# Patient Record
Sex: Male | Born: 1955 | Race: Black or African American | Hispanic: No | Marital: Married | Smoking: Never smoker
Health system: Southern US, Community
[De-identification: ages and names within clinical notes are randomized; demographics above are authoritative.]

## PROBLEM LIST (undated history)

## (undated) DIAGNOSIS — M5030 Other cervical disc degeneration, unspecified cervical region: Secondary | ICD-10-CM

## (undated) DIAGNOSIS — N486 Induration penis plastica: Secondary | ICD-10-CM

## (undated) DIAGNOSIS — K210 Gastro-esophageal reflux disease with esophagitis: Secondary | ICD-10-CM

## (undated) DIAGNOSIS — D122 Benign neoplasm of ascending colon: Secondary | ICD-10-CM

## (undated) DIAGNOSIS — K219 Gastro-esophageal reflux disease without esophagitis: Secondary | ICD-10-CM

## (undated) DIAGNOSIS — M1990 Unspecified osteoarthritis, unspecified site: Secondary | ICD-10-CM

## (undated) DIAGNOSIS — R350 Frequency of micturition: Secondary | ICD-10-CM

## (undated) HISTORY — DX: Benign neoplasm of ascending colon: D12.2

## (undated) HISTORY — DX: Gastro-esophageal reflux disease with esophagitis: K21.0

## (undated) HISTORY — DX: Gastro-esophageal reflux disease without esophagitis: K21.9

## (undated) HISTORY — DX: Unspecified osteoarthritis, unspecified site: M19.90

---

## 1998-01-06 DIAGNOSIS — M47812 Spondylosis without myelopathy or radiculopathy, cervical region: Secondary | ICD-10-CM | POA: Insufficient documentation

## 2004-05-23 ENCOUNTER — Ambulatory Visit: Payer: Self-pay | Admitting: Family Medicine

## 2004-05-29 ENCOUNTER — Ambulatory Visit: Payer: Self-pay | Admitting: Family Medicine

## 2004-06-06 HISTORY — PX: COLONOSCOPY: SHX174

## 2004-06-17 ENCOUNTER — Ambulatory Visit: Payer: Self-pay | Admitting: General Surgery

## 2005-09-29 ENCOUNTER — Other Ambulatory Visit: Payer: Self-pay

## 2005-09-29 ENCOUNTER — Emergency Department: Payer: Self-pay | Admitting: Emergency Medicine

## 2005-09-29 DIAGNOSIS — K2100 Gastro-esophageal reflux disease with esophagitis, without bleeding: Secondary | ICD-10-CM | POA: Insufficient documentation

## 2005-09-29 HISTORY — DX: Gastro-esophageal reflux disease with esophagitis, without bleeding: K21.00

## 2006-08-18 DIAGNOSIS — R42 Dizziness and giddiness: Secondary | ICD-10-CM | POA: Insufficient documentation

## 2007-07-12 DIAGNOSIS — M545 Low back pain, unspecified: Secondary | ICD-10-CM | POA: Insufficient documentation

## 2007-07-12 DIAGNOSIS — M542 Cervicalgia: Secondary | ICD-10-CM | POA: Insufficient documentation

## 2008-10-03 ENCOUNTER — Emergency Department: Payer: Self-pay | Admitting: Emergency Medicine

## 2009-09-17 ENCOUNTER — Emergency Department: Payer: Self-pay | Admitting: Emergency Medicine

## 2012-06-06 HISTORY — PX: OTHER SURGICAL HISTORY: SHX169

## 2012-06-16 ENCOUNTER — Other Ambulatory Visit: Payer: Self-pay | Admitting: Urology

## 2012-06-22 ENCOUNTER — Encounter (HOSPITAL_BASED_OUTPATIENT_CLINIC_OR_DEPARTMENT_OTHER): Payer: Self-pay | Admitting: *Deleted

## 2012-06-22 NOTE — Progress Notes (Signed)
NPO AFTER MN. ARRIVES AT 0700. NEEDS HG. 

## 2012-06-28 ENCOUNTER — Ambulatory Visit (HOSPITAL_BASED_OUTPATIENT_CLINIC_OR_DEPARTMENT_OTHER): Payer: BC Managed Care – PPO | Admitting: Anesthesiology

## 2012-06-28 ENCOUNTER — Encounter (HOSPITAL_BASED_OUTPATIENT_CLINIC_OR_DEPARTMENT_OTHER): Payer: Self-pay | Admitting: Anesthesiology

## 2012-06-28 ENCOUNTER — Encounter (HOSPITAL_BASED_OUTPATIENT_CLINIC_OR_DEPARTMENT_OTHER)
Admission: RE | Disposition: A | Discharge status: Home / Self Care | Payer: Self-pay | Source: Ambulatory Visit | Attending: Urology

## 2012-06-28 ENCOUNTER — Encounter (HOSPITAL_BASED_OUTPATIENT_CLINIC_OR_DEPARTMENT_OTHER): Payer: Self-pay | Admitting: *Deleted

## 2012-06-28 ENCOUNTER — Ambulatory Visit (HOSPITAL_BASED_OUTPATIENT_CLINIC_OR_DEPARTMENT_OTHER)
Admission: RE | Admit: 2012-06-28 | Discharge: 2012-06-28 | Disposition: A | Discharge status: Home / Self Care | Payer: BC Managed Care – PPO | Source: Ambulatory Visit | Attending: Urology | Admitting: Urology

## 2012-06-28 DIAGNOSIS — N529 Male erectile dysfunction, unspecified: Secondary | ICD-10-CM | POA: Insufficient documentation

## 2012-06-28 DIAGNOSIS — N486 Induration penis plastica: Secondary | ICD-10-CM | POA: Insufficient documentation

## 2012-06-28 DIAGNOSIS — Q5569 Other congenital malformation of penis: Secondary | ICD-10-CM | POA: Insufficient documentation

## 2012-06-28 HISTORY — DX: Induration penis plastica: N48.6

## 2012-06-28 HISTORY — DX: Frequency of micturition: R35.0

## 2012-06-28 HISTORY — PX: NESBIT PROCEDURE: SHX2087

## 2012-06-28 SURGERY — NESBIT PROCEDURE
Anesthesia: General | Site: Penis | Wound class: Clean

## 2012-06-28 MED ORDER — OXYCODONE-ACETAMINOPHEN 10-325 MG PO TABS: 1.0000 | ORAL_TABLET | ORAL | Status: DC | PRN

## 2012-06-28 MED ORDER — MIDAZOLAM HCL 5 MG/5ML IJ SOLN
INTRAMUSCULAR | Status: DC | PRN
  Administered 2012-06-28: 2 mg via INTRAVENOUS

## 2012-06-28 MED ORDER — OXYCODONE HCL 5 MG/5ML PO SOLN
5.0000 mg | Freq: Once | ORAL | Status: AC | PRN
  Filled 2012-06-28: qty 5

## 2012-06-28 MED ORDER — ACETAMINOPHEN 10 MG/ML IV SOLN
1000.0000 mg | Freq: Once | INTRAVENOUS | Status: DC | PRN
  Filled 2012-06-28: qty 100

## 2012-06-28 MED ORDER — PROPOFOL 10 MG/ML IV BOLUS
INTRAVENOUS | Status: DC | PRN
  Administered 2012-06-28: 200 mg via INTRAVENOUS

## 2012-06-28 MED ORDER — LACTATED RINGERS IV SOLN
INTRAVENOUS | Status: DC
  Administered 2012-06-28: 100 mL/h via INTRAVENOUS
  Filled 2012-06-28: qty 1000

## 2012-06-28 MED ORDER — PHENYLEPHRINE HCL 10 MG/ML IJ SOLN
INTRAMUSCULAR | Status: DC | PRN
  Administered 2012-06-28: .9 mL via SUBCUTANEOUS

## 2012-06-28 MED ORDER — BUPIVACAINE HCL (PF) 0.5 % IJ SOLN
INTRAMUSCULAR | Status: DC | PRN
  Administered 2012-06-28: 10 mL

## 2012-06-28 MED ORDER — MEPERIDINE HCL 25 MG/ML IJ SOLN
6.2500 mg | INTRAMUSCULAR | Status: DC | PRN
  Filled 2012-06-28: qty 1

## 2012-06-28 MED ORDER — BACITRACIN-NEOMYCIN-POLYMYXIN 400-5-5000 EX OINT
TOPICAL_OINTMENT | CUTANEOUS | Status: DC | PRN
  Administered 2012-06-28: 1 via TOPICAL

## 2012-06-28 MED ORDER — OXYCODONE HCL 5 MG PO TABS
5.0000 mg | ORAL_TABLET | Freq: Once | ORAL | Status: AC | PRN
  Administered 2012-06-28: 5 mg via ORAL
  Filled 2012-06-28: qty 1

## 2012-06-28 MED ORDER — CEFAZOLIN SODIUM-DEXTROSE 2-3 GM-% IV SOLR
2.0000 g | INTRAVENOUS | Status: AC
  Administered 2012-06-28: 2 g via INTRAVENOUS
  Filled 2012-06-28: qty 50

## 2012-06-28 MED ORDER — ONDANSETRON HCL 4 MG/2ML IJ SOLN
INTRAMUSCULAR | Status: DC | PRN
  Administered 2012-06-28: 4 mg via INTRAVENOUS

## 2012-06-28 MED ORDER — HYDROMORPHONE HCL PF 1 MG/ML IJ SOLN
0.2500 mg | INTRAMUSCULAR | Status: DC | PRN
  Filled 2012-06-28: qty 1

## 2012-06-28 MED ORDER — PROMETHAZINE HCL 25 MG/ML IJ SOLN
6.2500 mg | INTRAMUSCULAR | Status: DC | PRN
  Filled 2012-06-28: qty 1

## 2012-06-28 MED ORDER — LIDOCAINE HCL (CARDIAC) 20 MG/ML IV SOLN
INTRAVENOUS | Status: DC | PRN
  Administered 2012-06-28: 60 mg via INTRAVENOUS

## 2012-06-28 MED ORDER — PAPAVERINE HCL 30 MG/ML IJ SOLN
INTRAMUSCULAR | Status: DC | PRN
  Administered 2012-06-28: 4 mL via INTRAVENOUS

## 2012-06-28 MED ORDER — DEXAMETHASONE SODIUM PHOSPHATE 4 MG/ML IJ SOLN
INTRAMUSCULAR | Status: DC | PRN
  Administered 2012-06-28: 8 mg via INTRAVENOUS

## 2012-06-28 MED ORDER — FENTANYL CITRATE 0.05 MG/ML IJ SOLN
INTRAMUSCULAR | Status: DC | PRN
  Administered 2012-06-28 (×2): 50 ug via INTRAVENOUS

## 2012-06-28 MED ORDER — CEPHALEXIN 500 MG PO CAPS: 500.0000 mg | ORAL_CAPSULE | Freq: Three times a day (TID) | ORAL | Status: DC

## 2012-06-28 SURGICAL SUPPLY — 59 items
BAND RUBBER #16 2-1/2X1/16 STR (MISCELLANEOUS) IMPLANT
BANDAGE CO FLEX L/F 1IN X 5YD (GAUZE/BANDAGES/DRESSINGS) IMPLANT
BANDAGE CO FLEX L/F 2IN X 5YD (GAUZE/BANDAGES/DRESSINGS) IMPLANT
BANDAGE GAUZE ELAST BULKY 4 IN (GAUZE/BANDAGES/DRESSINGS) ×2 IMPLANT
BLADE SURG 15 STRL LF DISP TIS (BLADE) ×1 IMPLANT
BLADE SURG 15 STRL SS (BLADE) ×1
BLADE SURG ROTATE 9660 (MISCELLANEOUS) ×2 IMPLANT
BNDG COHESIVE 3X5 TAN STRL LF (GAUZE/BANDAGES/DRESSINGS) ×2 IMPLANT
CANISTER SUCTION 1200CC (MISCELLANEOUS) IMPLANT
CANISTER SUCTION 2500CC (MISCELLANEOUS) IMPLANT
CATH FOLEY 2WAY SLVR  5CC 16FR (CATHETERS) ×1
CATH FOLEY 2WAY SLVR 5CC 16FR (CATHETERS) ×1 IMPLANT
CATH ROBINSON RED A/P 8FR (CATHETERS) ×2 IMPLANT
CLOTH BEACON ORANGE TIMEOUT ST (SAFETY) ×2 IMPLANT
COVER TABLE BACK 60X90 (DRAPES) ×2 IMPLANT
DRAIN PENROSE 18X1/4 LTX STRL (WOUND CARE) ×2 IMPLANT
DRAPE PED LAPAROTOMY (DRAPES) ×2 IMPLANT
ELECT REM PT RETURN 9FT ADLT (ELECTROSURGICAL) ×2
ELECTRODE REM PT RTRN 9FT ADLT (ELECTROSURGICAL) ×1 IMPLANT
GAUZE SPONGE 4X4 12PLY STRL LF (GAUZE/BANDAGES/DRESSINGS) IMPLANT
GLOVE BIO SURGEON STRL SZ 6.5 (GLOVE) ×4 IMPLANT
GLOVE BIO SURGEON STRL SZ8 (GLOVE) ×2 IMPLANT
GLOVE ECLIPSE 6.0 STRL STRAW (GLOVE) ×2 IMPLANT
GOWN PREVENTION PLUS LG XLONG (DISPOSABLE) ×4 IMPLANT
GOWN STRL REIN XL XLG (GOWN DISPOSABLE) ×2 IMPLANT
HEMOSTAT SURGICEL 2X14 (HEMOSTASIS) ×2 IMPLANT
NEEDLE HYPO 30X.5 LL (NEEDLE) ×6 IMPLANT
NS IRRIG 500ML POUR BTL (IV SOLUTION) ×2 IMPLANT
PACK BASIN DAY SURGERY FS (CUSTOM PROCEDURE TRAY) ×2 IMPLANT
PENCIL BUTTON HOLSTER BLD 10FT (ELECTRODE) ×2 IMPLANT
PLUG CATH AND CAP STER (CATHETERS) ×2 IMPLANT
RETRACTOR LONRSTAR 16.6X16.6CM (MISCELLANEOUS) ×1 IMPLANT
RETRACTOR STAY HOOK 5MM (MISCELLANEOUS) ×2 IMPLANT
RETRACTOR STER APS 16.6X16.6CM (MISCELLANEOUS) ×2
SPONGE GAUZE 4X4 12PLY (GAUZE/BANDAGES/DRESSINGS) ×2 IMPLANT
SUCTION FRAZIER TIP 10 FR DISP (SUCTIONS) IMPLANT
SUPPORT SCROTAL LG STRP (MISCELLANEOUS) IMPLANT
SUT CHROMIC 3 0 SH 27 (SUTURE) ×4 IMPLANT
SUT CHROMIC 4 0 RB 1X27 (SUTURE) ×2 IMPLANT
SUT CHROMIC 5 0 RB 1 27 (SUTURE) IMPLANT
SUT ETHIBOND 2 0 SH (SUTURE) ×3
SUT ETHIBOND 2 0 SH 36X2 (SUTURE) ×3 IMPLANT
SUT ETHIBOND 2 OS 4 DA (SUTURE) IMPLANT
SUT ETHIBOND 3-0 V-5 (SUTURE) IMPLANT
SUT PDS AB 4-0 RB1 27 (SUTURE) IMPLANT
SUT SILK 4 0 (SUTURE) ×1
SUT SILK 4-0 18XBRD TIE 12 (SUTURE) ×1 IMPLANT
SUT VIC AB 5-0 RB1 27 (SUTURE) IMPLANT
SYR 3ML 23GX1 SAFETY (SYRINGE) ×4 IMPLANT
SYR BULB IRRIGATION 50ML (SYRINGE) ×2 IMPLANT
SYR CONTROL 10ML LL (SYRINGE) ×2 IMPLANT
SYR TB 1ML 27GX1/2 SAFE (SYRINGE) ×1 IMPLANT
SYR TB 1ML 27GX1/2 SAFETY (SYRINGE) ×1
SYR TB 1ML LL NO SAFETY (SYRINGE) ×2 IMPLANT
SYRINGE 10CC LL (SYRINGE) ×2 IMPLANT
TOWEL OR 17X24 6PK STRL BLUE (TOWEL DISPOSABLE) ×4 IMPLANT
TUBE CONNECTING 12X1/4 (SUCTIONS) ×2 IMPLANT
WATER STERILE IRR 500ML POUR (IV SOLUTION) ×2 IMPLANT
YANKAUER SUCT BULB TIP NO VENT (SUCTIONS) ×2 IMPLANT

## 2012-06-28 NOTE — Op Note (Signed)
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 He had some difficulty with erectile dysfunction but has begun to have spontaneous erections and continue to have significant enough curvature that he is unable to achieve intromission. We have discussed the procedure, its risks and complications and alternatives. He understands and has elected to proceed with surgical correction.  ANESTHESIA:  General  EBL:  Minimal  DRAINS: None  LOCAL MEDICATIONS USED:  10 cc of 1/2% Marcaine  SPECIMEN: None  Description of procedure: After informed consent the patient was brought to the major or, placed on the table and administered general anesthesia. His genitalia was sterilely prepped and draped and an official timeout was then performed.  I initially injected 60 mg of papaverine into the right corpus cavernosum and then made a midline incision after placing the Foley catheter. The incision was made over the urethra in the midportion of the penis as I noted the greatest amount of curvature was at that location. I then used sharp technique to dissect the tissue off of the corpus spongiosum and then down laterally off of each corpus cavernosum exposing about 5 mm of the corpus cavernosum lateral to the spongiosal tissue. The patient achieved a modest erection however I injected a further 30 mg of papaverine and noted an excellent erection. I positioned 8 dots along the corpus cavernosum approximately 3 mm lateral to the corpus spongiosum starting at the base and progressing up to about 4 mm proximal to the glans. I used a surgical marker for this.  I used 2-0 Ethilon suture in an in and out fashion through the previously marked dots. 4 sutures were placed  in total. I then tied each one with a single surgeon's knot and placed a shod hemostat and checked for straightening of the penis. There appeared to be excellent straightening with no curvature laterally, ventrally  or dorsally. I then tied the distal sutures and checked the curvature once again. I injected a final 30 mg of papaverine and noted an excellent, straight erection. I then tied the proximal sutures and rechecked the degree of straightening which appeared to be excellent.  I injected 500 mcg of Neo-Synephrine which resulted in excellent detumescence. I closed the deep tissue in the midline with a running, locking 3-0 chromic suture and then closed the skin in the midline with a running 4-0 chromic suture. Neosporin, 4 x 4's and Curlex were loosely placed around the penis and secured. The patient received 10 cc of Marcaine as a dorsal penile block at the beginning of the procedure. He tolerated procedure well no intraoperative complications. Needle, sponge and instrument counts were correct x2 at the end of the operation.   PLAN OF CARE: Discharge to home after PACU  PATIENT DISPOSITION:  PACU - hemodynamically stable.

## 2012-06-28 NOTE — Transfer of Care (Signed)
Immediate Anesthesia Transfer of Care Note  Patient: Micheal Mccoy  Procedure(s) Performed: Procedure(s) (LRB): 16 DOT PLACTATION PROCEDURE (N/A)  Patient Location: PACU  Anesthesia Type: General  Level of Consciousness: awake, oriented, sedated and patient cooperative  Airway & Oxygen Therapy: Patient Spontanous Breathing and Patient connected to face mask oxygen  Post-op Assessment: Report given to PACU RN and Post -op Vital signs reviewed and stable  Post vital signs: Reviewed and stable  Complications: No apparent anesthesia complications

## 2012-06-28 NOTE — H&P (Signed)
Micheal Mccoy is a 57 year old male with  Peyronie's disease.   History of Present Illness            Peyronie's disease: He had an episode of traumatic sexual activity, and developed  in his penis, and can get only an erection 1/2 of penis, with the distal portion remaining flaccid. He developed progressive loss of length of the erection but more importantly is the difficulty with the distal aspect remaining flaccid. His curvature has worsened. The curveature is dorsal, approximal 70 degrees, with softness of the ventral distal portion, and scar of the dorsal portion. He has no hourglass deformity. He has good sensation of the glans. Treatment: Intracavernosal injections (PGE1 20 mcg/papaverine 30 mg and phentolamine 1 mg.). He rated his erection overall with the medication 7/10.   Interval history: He continues to have curvature and is having some spontaneous erections. He would like to proceed with surgical correction. We discussed the options previously and he is interested in a plication procedure based on her previous discussion of all of the treatment options.    Past Medical History Problems  1. History of  No Medical Problems  Surgical History Problems  1. History of  No Surgical Problems  Current Meds 1. Trimix (PGE, PAP, PHE); Dose: 20 mCg PGE, 30 mg. PAP and 1 mg. PHE in 1 ml; Therapy:  15Nov2013 to (Last Rx:06Dec2013)  Requested for: 06Dec2013  Allergies Medication  1. No Known Drug Allergies  Family History Problems  1. Family history of  Family Health Status Number Of Children 1 son, 2 daughters 2. Family history of  Father Deceased At Age 17 stroke 3. Family history of  Mother Deceased At Age 51 natural causes 4. Daughter's history of  Nephrolithiasis 5. Daughter's history of  Scoliosis 6. Paternal history of  Stroke Syndrome V17.1  Social History Problems  1. Alcohol Use 2. Caffeine Use 1-2 per day 3. Marital History - Currently Married 4. Never A Smoker 5.  Occupation: Product manager  Review of Systems Genitourinary, constitutional, skin, eye, otolaryngeal, hematologic/lymphatic, cardiovascular, pulmonary, endocrine, musculoskeletal, gastrointestinal, neurological and psychiatric system(s) were reviewed and pertinent findings if present are noted.  Genitourinary: erectile dysfunction, penile pain, penile curvature, penile mass and penile lesion.   Vitals Vital Signs BMI Calculated: 23.87 BSA Calculated: 2.14 Height: 6 ft 3 in Weight: 190 lb  Blood Pressure: 154 / 91 Temperature: 99.1 F Heart Rate: 86  PE: General appearance: alert, cooperative and no distress Neck: no adenopathy and no JVD Resp: clear to auscultation bilaterally Cardio: regular rate and rhythm GI: soft, non-tender; bowel sounds normal; no masses,  no organomegaly Genitourinary: Examination of the penis demonstrates tenderness on palpation, but no swelling, no discharge, no masses, no adherence of the prepuce, no phimosis, no paraphimosis, no balanitis, no priapism, no lesions and a normal meatus. The penis is circumcised. The scrotum is normal in appearance and without lesions. Examination of the right scrotum demonstrates no mass. Examination of the left scrotum demostrates no mass. The right epididymis is palpably normal and non-tender. The left epididymis is palpably normal and non-tender. The right testis is palpably normal, non-tender and without masses. The left testis is normal, non-tender and without masses. Corporal cavernosal plaque, distal-shaft, tender to palpation. L > R.  Extremities: extremities normal, atraumatic, no cyanosis or edema Skin: Skin color, texture, turgor normal. No rashes or lesions Neurologic: Grossly normal  Assessment Assessed  1. Peyronie's Disease 607.85   We discussed the plication procedure in detail. I went  over the incision used, the risks and complications, the anticipated postoperative course and the probability of success. We  did also specifically discussed the fact that this does foreshorten the penis on the contralateral side of the curvature. He understands this, all of his questions were answered to his satisfaction and he has elected to proceed.   Plan    He is scheduled for a 19-Dot plication procedure.

## 2012-06-28 NOTE — Anesthesia Preprocedure Evaluation (Addendum)
Anesthesia Evaluation  Patient identified by MRN, date of birth, ID band Patient awake    Reviewed: Allergy & Precautions, H&P , NPO status , Patient's Chart, lab work & pertinent test results  Airway Mallampati: I TM Distance: >3 FB Neck ROM: Full    Dental  (+) Dental Advisory Given and Teeth Intact   Pulmonary neg pulmonary ROS,  breath sounds clear to auscultation        Cardiovascular negative cardio ROS  Rhythm:Regular Rate:Normal     Neuro/Psych negative neurological ROS  negative psych ROS   GI/Hepatic negative GI ROS, Neg liver ROS,   Endo/Other  negative endocrine ROS  Renal/GU negative Renal ROS     Musculoskeletal negative musculoskeletal ROS (+)   Abdominal   Peds  Hematology negative hematology ROS (+)   Anesthesia Other Findings   Reproductive/Obstetrics                          Anesthesia Physical Anesthesia Plan  ASA: I  Anesthesia Plan: General   Post-op Pain Management:    Induction: Intravenous  Airway Management Planned: LMA  Additional Equipment:   Intra-op Plan:   Post-operative Plan: Extubation in OR  Informed Consent: I have reviewed the patients History and Physical, chart, labs and discussed the procedure including the risks, benefits and alternatives for the proposed anesthesia with the patient or authorized representative who has indicated his/her understanding and acceptance.   Dental advisory given  Plan Discussed with: CRNA  Anesthesia Plan Comments:         Anesthesia Quick Evaluation

## 2012-06-28 NOTE — Anesthesia Procedure Notes (Signed)
Procedure Name: LMA Insertion Date/Time: 06/28/2012 8:37 AM Performed by: Renella Cunas D Pre-anesthesia Checklist: Patient identified, Emergency Drugs available, Suction available and Patient being monitored Patient Re-evaluated:Patient Re-evaluated prior to inductionOxygen Delivery Method: Circle System Utilized Preoxygenation: Pre-oxygenation with 100% oxygen Intubation Type: IV induction Ventilation: Mask ventilation without difficulty LMA: LMA inserted LMA Size: 5.0 Number of attempts: 1 Airway Equipment and Method: bite block Placement Confirmation: positive ETCO2 Tube secured with: Tape Dental Injury: Teeth and Oropharynx as per pre-operative assessment

## 2012-06-28 NOTE — Anesthesia Postprocedure Evaluation (Signed)
Anesthesia Post Note  Patient: Micheal Mccoy  Procedure(s) Performed: Procedure(s) (LRB): 16 DOT PLACTATION PROCEDURE (N/A)  Anesthesia type: General  Patient location: PACU  Post pain: Pain level controlled  Post assessment: Post-op Vital signs reviewed  Last Vitals: BP 131/86  Pulse 60  Temp(Src) 36.1 C (Oral)  Resp 16  Ht 6\' 3"  (1.905 m)  Wt 194 lb (87.998 kg)  BMI 24.25 kg/m2  SpO2 98%  Post vital signs: Reviewed  Level of consciousness: sedated  Complications: No apparent anesthesia complications

## 2012-06-29 ENCOUNTER — Encounter (HOSPITAL_BASED_OUTPATIENT_CLINIC_OR_DEPARTMENT_OTHER): Payer: Self-pay | Admitting: Urology

## 2012-06-29 LAB — POCT HEMOGLOBIN-HEMACUE: Hemoglobin: 15.9 g/dL (ref 13.0–17.0)

## 2013-03-25 LAB — BASIC METABOLIC PANEL
BUN: 11 mg/dL (ref 4–21)
Creatinine: 1 mg/dL (ref 0.6–1.3)
GLUCOSE: 89 mg/dL
POTASSIUM: 4.3 mmol/L (ref 3.4–5.3)
Sodium: 142 mmol/L (ref 137–147)

## 2013-03-25 LAB — LIPID PANEL
Cholesterol: 149 mg/dL (ref 0–200)
HDL: 46 mg/dL (ref 35–70)
LDL CALC: 96 mg/dL
Triglycerides: 33 mg/dL — AB (ref 40–160)

## 2013-03-25 LAB — PSA: PSA: 4

## 2014-08-24 DIAGNOSIS — N4889 Other specified disorders of penis: Secondary | ICD-10-CM | POA: Insufficient documentation

## 2014-08-24 DIAGNOSIS — N529 Male erectile dysfunction, unspecified: Secondary | ICD-10-CM | POA: Insufficient documentation

## 2014-08-24 DIAGNOSIS — M1990 Unspecified osteoarthritis, unspecified site: Secondary | ICD-10-CM | POA: Insufficient documentation

## 2014-08-24 DIAGNOSIS — N400 Enlarged prostate without lower urinary tract symptoms: Secondary | ICD-10-CM | POA: Insufficient documentation

## 2014-08-24 DIAGNOSIS — R195 Other fecal abnormalities: Secondary | ICD-10-CM | POA: Insufficient documentation

## 2014-08-25 ENCOUNTER — Encounter: Payer: Self-pay | Admitting: Family Medicine

## 2014-08-25 ENCOUNTER — Ambulatory Visit (INDEPENDENT_AMBULATORY_CARE_PROVIDER_SITE_OTHER): Payer: BC Managed Care – PPO | Admitting: Family Medicine

## 2014-08-25 VITALS — BP 102/68 | HR 66 | Temp 98.6°F | Resp 16 | Ht 75.0 in | Wt 198.0 lb

## 2014-08-25 DIAGNOSIS — Z0000 Encounter for general adult medical examination without abnormal findings: Secondary | ICD-10-CM

## 2014-08-25 DIAGNOSIS — Z1211 Encounter for screening for malignant neoplasm of colon: Secondary | ICD-10-CM | POA: Diagnosis not present

## 2014-08-25 DIAGNOSIS — N400 Enlarged prostate without lower urinary tract symptoms: Secondary | ICD-10-CM | POA: Diagnosis not present

## 2014-08-25 MED ORDER — TADALAFIL 5 MG PO TABS: 5.0000 mg | ORAL_TABLET | Freq: Every day | ORAL | Status: DC

## 2014-08-25 NOTE — Progress Notes (Signed)
Patient: Micheal Mccoy, Male    DOB: 1955/02/22, 59 y.o.   MRN: [id number] Visit Date: 08/25/2014  Today's Provider: Lelon Huh, MD   Chief Complaint  Patient presents with  . Annual Exam  . Benign Prostatic Hypertrophy  . Erectile Dysfunction   Subjective:    Annual physical exam Micheal Mccoy is a 59 y.o. male who presents today for health maintenance and complete physical. He feels well. He reports exercising yes. He reports he is sleeping well.  ----------------------------------------------------------------- Follow-up for BPH (benign prostatic hypertrophy) and erectile dysfunction from 03/22/2013; started Cialis 5 mg 1 tablet qd.   Review of Systems  Constitutional: Negative.   HENT: Positive for hearing loss. Negative for congestion, dental problem, ear discharge, ear pain, postnasal drip, sinus pressure and sore throat.   Eyes: Positive for photophobia. Negative for pain, discharge and visual disturbance.  Respiratory: Negative.   Cardiovascular: Negative.   Gastrointestinal: Negative.   Endocrine: Positive for polyuria. Negative for cold intolerance, heat intolerance, polydipsia and polyphagia.  Genitourinary: Positive for urgency and difficulty urinating. Negative for dysuria, frequency, flank pain, decreased urine volume and discharge.  Musculoskeletal: Positive for neck pain and neck stiffness. Negative for back pain and joint swelling.  Skin: Negative.   Allergic/Immunologic: Negative.   Neurological: Positive for numbness. Negative for dizziness, weakness, light-headedness and headaches.  Hematological: Negative.   Psychiatric/Behavioral: Negative.     Social History He  reports that he has never smoked. He has never used smokeless tobacco. He reports that he does not drink alcohol or use illicit drugs.  Patient Active Problem List   Diagnosis Date Noted  . Arthritis 08/24/2014  . BPH (benign prostatic hypertrophy) 08/24/2014  . Penile mass  08/24/2014  . Erectile dysfunction 08/24/2014  . Fecal occult blood test positive 08/24/2014  . Peyronie's disease 06/28/2012  . Cervicalgia 07/12/2007  . Lumbago 07/12/2007  . Dizziness and giddiness 08/18/2006  . Esophagitis, reflux 09/29/2005  . Cervical spondylosis without myelopathy 01/06/1998    Past Surgical History  Procedure Laterality Date  . Nesbit procedure N/A 06/28/2012    Procedure: 16 DOT PLACTATION PROCEDURE;  Surgeon: Claybon Jabs, MD;  Location: [facility];  Service: Urology;  Laterality: N/A;  . Peyronies disease  06/2012  . Colonoscopy  06/2004    Done by Dr. Bary Castilla. No polyps    Family History  Family Status  Relation Status Death Age  . Mother Deceased 64    natural causes  . Father Deceased   . Sister Alive   . Brother Alive   . Daughter Alive   . Son Alive   . Brother Deceased     MVA  . Daughter Alive     His family history is not on file.   Family Status  Relation Status Death Age  . Mother Deceased 38    natural causes  . Father Deceased   . Sister Alive   . Brother Alive   . Daughter Alive   . Son Alive   . Brother Deceased     MVA  . Daughter Alive      No Known Allergies  Previous Medications   TADALAFIL (CIALIS) 5 MG TABLET    Take 1 tablet by mouth daily. For BPH    Patient Care Team: [name], MD as PCP - General (Family Medicine)     Objective:   Vitals: BP 102/68 mmHg  Pulse 66  Temp(Src) 98.6 F (37  C) (Oral)  Resp 16  Ht 6\' 3"  (1.905 m)  Wt 198 lb (89.812 kg)  BMI 24.75 kg/m2  SpO2 98%   Physical Exam   General Appearance:    Alert, cooperative, no distress, appears stated age  Head:    Normocephalic, without obvious abnormality, atraumatic  Eyes:    PERRL, conjunctiva/corneas clear, EOM's intact, fundi    benign, both eyes       Ears:    Normal TM's and external ear canals, both ears  Nose:   Nares normal, septum midline, mucosa normal, no drainage   or sinus tenderness    Throat:   Lips, mucosa, and tongue normal; teeth and gums normal  Neck:   Supple, symmetrical, trachea midline, no adenopathy;       thyroid:  No enlargement/tenderness/nodules; no carotid   bruit or JVD  Back:     Symmetric, no curvature, ROM normal, no CVA tenderness  Lungs:     Clear to auscultation bilaterally, respirations unlabored  Chest wall:    No tenderness or deformity  Heart:    Regular rate and rhythm, S1 and S2 normal, no murmur, rub   or gallop  Abdomen:     Soft, non-tender, bowel sounds active all four quadrants,    no masses, no organomegaly  Genitalia:    deferred  Rectal:    deferred  Extremities:   Extremities normal, atraumatic, no cyanosis or edema  Pulses:   2+ and symmetric all extremities  Skin:   Skin color, texture, turgor normal, no rashes or lesions  Lymph nodes:   Cervical, supraclavicular, and axillary nodes normal  Neurologic:   CNII-XII intact. Normal strength, sensation and reflexes      throughout     Depression Screen PHQ 2/9 Scores 08/25/2014  PHQ - 2 Score 0  PHQ- 9 Score 0      Assessment & Plan:     Routine Health Maintenance and Physical Exam Exercise Activities and Dietary recommendations Goals    None      Immunization History  Administered Date(s) Administered  . Tdap 03/22/2013    Health Maintenance  Topic Date Due  . COLONOSCOPY  08/06/2003  . ZOSTAVAX  08/05/2013  . INFLUENZA VACCINE  08/07/2014      Discussed health benefits of physical activity, and encouraged him to engage in regular exercise appropriate for his age and condition.    --------------------------------------------------------------------  1. Annual physical exam Generally doing well, he is having a little difficulty with his hearing, but audiometry test fair. Counseled to protect ears from loud noises. He declined shingles vaccine.  - PSA - Basic metabolic panel  2. Colon cancer screening  - Ambulatory referral to Gastroenterology  3.  BPH (benign prostatic hypertrophy) Had done well with daily Cialis.  - tadalafil (CIALIS) 5 MG tablet; Take 1 tablet (5 mg total) by mouth daily. For BPH  Dispense: 90 tablet; Refill: 3

## 2014-08-26 LAB — BASIC METABOLIC PANEL
BUN/Creatinine Ratio: 12 (ref 10–22)
BUN: 10 mg/dL (ref 8–27)
CALCIUM: 9.5 mg/dL (ref 8.6–10.2)
CO2: 28 mmol/L (ref 18–29)
CREATININE: 0.83 mg/dL (ref 0.76–1.27)
Chloride: 101 mmol/L (ref 97–108)
GFR, EST AFRICAN AMERICAN: 110 mL/min/{1.73_m2} (ref >59)
GFR, EST NON AFRICAN AMERICAN: 95 mL/min/{1.73_m2} (ref >59)
Glucose: 71 mg/dL (ref 65–99)
POTASSIUM: 4.6 mmol/L (ref 3.5–5.2)
Sodium: 143 mmol/L (ref 134–144)

## 2014-08-26 LAB — PSA: PROSTATE SPECIFIC AG, SERUM: 3.9 ng/mL (ref 0.0–4.0)

## 2014-09-05 ENCOUNTER — Telehealth: Payer: Self-pay | Admitting: Gastroenterology

## 2014-09-05 NOTE — Telephone Encounter (Signed)
Colonoscopy triage. Patient wanted me to put another message in

## 2014-09-06 ENCOUNTER — Other Ambulatory Visit: Payer: Self-pay

## 2014-09-06 NOTE — Telephone Encounter (Signed)
Gastroenterology Pre-Procedure Review  Request Date: 09-14-14 Requesting Physician: Dr. Caryn Section  PATIENT REVIEW QUESTIONS: The patient responded to the following health history questions as indicated:    1. Are you having any GI issues? no 2. Do you have a personal history of Polyps? no 3. Do you have a family history of Colon Cancer or Polyps? no 4. Diabetes Mellitus? no 5. Joint replacements in the past 12 months?no 6. Major health problems in the past 3 months?no 7. Any artificial heart valves, MVP, or defibrillator?no    MEDICATIONS & ALLERGIES:    Patient reports the following regarding taking any anticoagulation/antiplatelet therapy:   Plavix, Coumadin, Eliquis, Xarelto, Lovenox, Pradaxa, Brilinta, or Effient? no Aspirin? no  Patient confirms/reports the following medications:  Current Outpatient Prescriptions  Medication Sig Dispense Refill  . tadalafil (CIALIS) 5 MG tablet Take 1 tablet (5 mg total) by mouth daily. For BPH 90 tablet 3   No current facility-administered medications for this visit.    Patient confirms/reports the following allergies:  No Known Allergies  No orders of the defined types were placed in this encounter.    AUTHORIZATION INFORMATION Primary Insurance: 1D#: Group #:  Secondary Insurance: 1D#: Group #:  SCHEDULE INFORMATION: Date:  09-14-14 Time: Location: [place]

## 2014-09-08 ENCOUNTER — Encounter: Payer: Self-pay | Admitting: *Deleted

## 2014-09-13 NOTE — Discharge Instructions (Signed)

## 2014-09-14 ENCOUNTER — Encounter: Payer: Self-pay | Admitting: *Deleted

## 2014-09-14 ENCOUNTER — Other Ambulatory Visit: Payer: Self-pay | Admitting: Gastroenterology

## 2014-09-14 ENCOUNTER — Ambulatory Visit: Payer: BC Managed Care – PPO | Admitting: Anesthesiology

## 2014-09-14 ENCOUNTER — Encounter
Admission: RE | Disposition: A | Discharge status: Home / Self Care | Payer: Self-pay | Source: Ambulatory Visit | Attending: Gastroenterology

## 2014-09-14 ENCOUNTER — Ambulatory Visit
Admission: RE | Admit: 2014-09-14 | Discharge: 2014-09-14 | Disposition: A | Discharge status: Home / Self Care | Payer: BC Managed Care – PPO | Source: Ambulatory Visit | Attending: Gastroenterology | Admitting: Gastroenterology

## 2014-09-14 DIAGNOSIS — R3915 Urgency of urination: Secondary | ICD-10-CM | POA: Diagnosis not present

## 2014-09-14 DIAGNOSIS — R350 Frequency of micturition: Secondary | ICD-10-CM | POA: Diagnosis not present

## 2014-09-14 DIAGNOSIS — N486 Induration penis plastica: Secondary | ICD-10-CM | POA: Insufficient documentation

## 2014-09-14 DIAGNOSIS — D122 Benign neoplasm of ascending colon: Secondary | ICD-10-CM | POA: Diagnosis not present

## 2014-09-14 DIAGNOSIS — M1990 Unspecified osteoarthritis, unspecified site: Secondary | ICD-10-CM | POA: Insufficient documentation

## 2014-09-14 DIAGNOSIS — N401 Enlarged prostate with lower urinary tract symptoms: Secondary | ICD-10-CM | POA: Diagnosis not present

## 2014-09-14 DIAGNOSIS — K219 Gastro-esophageal reflux disease without esophagitis: Secondary | ICD-10-CM | POA: Insufficient documentation

## 2014-09-14 DIAGNOSIS — Z860101 Personal history of adenomatous and serrated colon polyps: Secondary | ICD-10-CM | POA: Insufficient documentation

## 2014-09-14 DIAGNOSIS — K640 First degree hemorrhoids: Secondary | ICD-10-CM | POA: Insufficient documentation

## 2014-09-14 DIAGNOSIS — N400 Enlarged prostate without lower urinary tract symptoms: Secondary | ICD-10-CM | POA: Diagnosis not present

## 2014-09-14 DIAGNOSIS — M5030 Other cervical disc degeneration, unspecified cervical region: Secondary | ICD-10-CM | POA: Insufficient documentation

## 2014-09-14 DIAGNOSIS — Z1211 Encounter for screening for malignant neoplasm of colon: Secondary | ICD-10-CM | POA: Insufficient documentation

## 2014-09-14 DIAGNOSIS — Z86010 Personal history of colonic polyps: Secondary | ICD-10-CM | POA: Insufficient documentation

## 2014-09-14 HISTORY — DX: Other cervical disc degeneration, unspecified cervical region: M50.30

## 2014-09-14 HISTORY — PX: COLONOSCOPY WITH PROPOFOL: SHX5780

## 2014-09-14 HISTORY — PX: POLYPECTOMY: SHX5525

## 2014-09-14 SURGERY — COLONOSCOPY WITH PROPOFOL
Anesthesia: Monitor Anesthesia Care | Wound class: Contaminated

## 2014-09-14 MED ORDER — STERILE WATER FOR IRRIGATION IR SOLN
Status: DC | PRN
  Administered 2014-09-14: 09:00:00

## 2014-09-14 MED ORDER — LACTATED RINGERS IV SOLN
INTRAVENOUS | Status: DC
  Administered 2014-09-14: 09:00:00 via INTRAVENOUS

## 2014-09-14 MED ORDER — PROPOFOL 10 MG/ML IV BOLUS
INTRAVENOUS | Status: DC | PRN
  Administered 2014-09-14: 100 mg via INTRAVENOUS
  Administered 2014-09-14 (×3): 40 mg via INTRAVENOUS
  Administered 2014-09-14: 20 mg via INTRAVENOUS
  Administered 2014-09-14: 40 mg via INTRAVENOUS

## 2014-09-14 MED ORDER — LIDOCAINE HCL (CARDIAC) 20 MG/ML IV SOLN
INTRAVENOUS | Status: DC | PRN
  Administered 2014-09-14: 30 mg via INTRAVENOUS

## 2014-09-14 SURGICAL SUPPLY — 28 items
CANISTER SUCT 1200ML W/VALVE (MISCELLANEOUS) ×3 IMPLANT
FCP ESCP3.2XJMB 240X2.8X (MISCELLANEOUS)
FORCEPS BIOP RAD 4 LRG CAP 4 (CUTTING FORCEPS) IMPLANT
FORCEPS BIOP RJ4 240 W/NDL (MISCELLANEOUS)
FORCEPS ESCP3.2XJMB 240X2.8X (MISCELLANEOUS) IMPLANT
GOWN CVR UNV OPN BCK APRN NK (MISCELLANEOUS) ×4 IMPLANT
GOWN ISOL THUMB LOOP REG UNIV (MISCELLANEOUS) ×2
HEMOCLIP INSTINCT (CLIP) IMPLANT
INJECTOR VARIJECT VIN23 (MISCELLANEOUS) IMPLANT
KIT CO2 TUBING (TUBING) IMPLANT
KIT DEFENDO VALVE AND CONN (KITS) IMPLANT
KIT ENDO PROCEDURE OLY (KITS) ×3 IMPLANT
LIGATOR MULTIBAND 6SHOOTER MBL (MISCELLANEOUS) IMPLANT
MARKER SPOT ENDO TATTOO 5ML (MISCELLANEOUS) IMPLANT
PAD GROUND ADULT SPLIT (MISCELLANEOUS) IMPLANT
SNARE SHORT THROW 13M SML OVAL (MISCELLANEOUS) IMPLANT
SNARE SHORT THROW 30M LRG OVAL (MISCELLANEOUS) IMPLANT
SPOT EX ENDOSCOPIC TATTOO (MISCELLANEOUS)
SUCTION POLY TRAP 4CHAMBER (MISCELLANEOUS) IMPLANT
TRAP SUCTION POLY (MISCELLANEOUS) IMPLANT
TUBING CONN 6MMX3.1M (TUBING)
TUBING SUCTION CONN 0.25 STRL (TUBING) IMPLANT
UNDERPAD 30X60 958B10 (PK) (MISCELLANEOUS) IMPLANT
VALVE BIOPSY ENDO (VALVE) IMPLANT
VARIJECT INJECTOR VIN23 (MISCELLANEOUS)
WATER AUXILLARY (MISCELLANEOUS) IMPLANT
WATER STERILE IRR 250ML POUR (IV SOLUTION) ×3 IMPLANT
WATER STERILE IRR 500ML POUR (IV SOLUTION) IMPLANT

## 2014-09-14 NOTE — Anesthesia Preprocedure Evaluation (Signed)
Anesthesia Evaluation  Patient identified by MRN, date of birth, ID band  Reviewed: NPO status   History of Anesthesia Complications Negative for: history of anesthetic complications  Airway Mallampati: II  TM Distance: >3 FB Neck ROM: full    Dental no notable dental hx.    Pulmonary neg pulmonary ROS,    Pulmonary exam normal        Cardiovascular Exercise Tolerance: Good negative cardio ROS Normal cardiovascular exam     Neuro/Psych negative neurological ROS  negative psych ROS   GI/Hepatic Neg liver ROS, GERD  Controlled,  Endo/Other  negative endocrine ROS  Renal/GU negative Renal ROS   Bph; W/ PENILE CURVATURE > Peyronie's Dz;    Musculoskeletal  (+) Arthritis ,   Abdominal   Peds  Hematology negative hematology ROS (+)   Anesthesia Other Findings   Reproductive/Obstetrics                             Anesthesia Physical Anesthesia Plan  ASA: II  Anesthesia Plan: MAC   Post-op Pain Management:    Induction:   Airway Management Planned:   Additional Equipment:   Intra-op Plan:   Post-operative Plan:   Informed Consent: I have reviewed the patients History and Physical, chart, labs and discussed the procedure including the risks, benefits and alternatives for the proposed anesthesia with the patient or authorized representative who has indicated his/her understanding and acceptance.     Plan Discussed with: CRNA  Anesthesia Plan Comments:         Anesthesia Quick Evaluation

## 2014-09-14 NOTE — Transfer of Care (Signed)
Immediate Anesthesia Transfer of Care Note  Patient: Micheal Mccoy  Procedure(s) Performed: Procedure(s): COLONOSCOPY WITH PROPOFOL (N/A) POLYPECTOMY  Patient Location: PACU  Anesthesia Type: MAC  Level of Consciousness: awake, alert  and patient cooperative  Airway and Oxygen Therapy: Patient Spontanous Breathing and Patient connected to supplemental oxygen  Post-op Assessment: Post-op Vital signs reviewed, Patient's Cardiovascular Status Stable, Respiratory Function Stable, Patent Airway and No signs of Nausea or vomiting  Post-op Vital Signs: Reviewed and stable  Complications: No apparent anesthesia complications

## 2014-09-14 NOTE — Anesthesia Procedure Notes (Signed)
Procedure Name: MAC Performed by: Tine Mabee Pre-anesthesia Checklist: Patient identified, Emergency Drugs available, Suction available, Patient being monitored and Timeout performed Patient Re-evaluated:Patient Re-evaluated prior to inductionOxygen Delivery Method: Nasal cannula       

## 2014-09-14 NOTE — Anesthesia Postprocedure Evaluation (Signed)
  Anesthesia Post-op Note  Patient: Micheal Mccoy  Procedure(s) Performed: Procedure(s): COLONOSCOPY WITH PROPOFOL (N/A) POLYPECTOMY  Anesthesia type:MAC  Patient location: PACU  Post pain: Pain level controlled  Post assessment: Post-op Vital signs reviewed, Patient's Cardiovascular Status Stable, Respiratory Function Stable, Patent Airway and No signs of Nausea or vomiting  Post vital signs: Reviewed and stable  Last Vitals:  Filed Vitals:   09/14/14 0939  BP: 119/99  Pulse: 58  Temp:   Resp: 20    Level of consciousness: awake, alert  and patient cooperative  Complications: No apparent anesthesia complications

## 2014-09-14 NOTE — Op Note (Signed)
[facility] Gastroenterology Patient Name: Micheal Mccoy Procedure Date: 09/14/2014 8:45 AM MRN: [id number] Account #: [id number] Date of Birth: [birth year] Admit Type: Outpatient Age: 59 Room: [facility] OR ROOM 01 Gender: Male Note Status: Finalized Procedure:         Colonoscopy Indications:       Screening for colorectal malignant neoplasm Providers:         Lucilla Lame, MD Referring MD:      [name], MD (Referring MD) Medicines:         Propofol per Anesthesia Complications:     No immediate complications. Procedure:         Pre-Anesthesia Assessment:                    - Prior to the procedure, a History and Physical was                     performed, and patient medications and allergies were                     reviewed. The patient's tolerance of previous anesthesia                     was also reviewed. The risks and benefits of the procedure                     and the sedation options and risks were discussed with the                     patient. All questions were answered, and informed consent                     was obtained. Prior Anticoagulants: The patient has taken                     no previous anticoagulant or antiplatelet agents. ASA                     Grade Assessment: II - A patient with mild systemic                     disease. After reviewing the risks and benefits, the                     patient was deemed in satisfactory condition to undergo                     the procedure.                    After obtaining informed consent, the colonoscope was                     passed under direct vision. Throughout the procedure, the                     patient's blood pressure, pulse, and oxygen saturations                     were monitored continuously. The Olympus CF H180AL                     colonoscope (S#: [id number]) was introduced through the anus  and advanced to the the cecum, identified by appendiceal          orifice and ileocecal valve. The colonoscopy was performed                     without difficulty. The patient tolerated the procedure                     well. The quality of the bowel preparation was excellent. Findings:      The perianal and digital rectal examinations were normal.      A 5 mm polyp was found in the ascending colon. The polyp was sessile.       The polyp was removed with a cold snare. Resection and retrieval were       complete.      Non-bleeding internal hemorrhoids were found during retroflexion. The       hemorrhoids were Grade I (internal hemorrhoids that do not prolapse). Impression:        - One 5 mm polyp in the ascending colon. Resected and                     retrieved.                    - Non-bleeding internal hemorrhoids. Recommendation:    - Await pathology results.                    - Repeat colonoscopy in 5 years if polyp adenoma and 10                     years if hyperplastic Procedure Code(s): --- Professional ---                    [phone], Colonoscopy, flexible; with removal of tumor(s),                     polyp(s), or other lesion(s) by snare technique Diagnosis Code(s): --- Professional ---                    Z12.11, Encounter for screening for malignant neoplasm of                     colon                    D12.2, Benign neoplasm of ascending colon CPT copyright 2014 American Medical Association. All rights reserved. The codes documented in this report are preliminary and upon coder review may  be revised to meet current compliance requirements. Lucilla Lame, MD 09/14/2014 9:08:35 AM This report has been signed electronically. Number of Addenda: 0 Note Initiated On: 09/14/2014 8:45 AM Scope Withdrawal Time: 0 hours 6 minutes 12 seconds  Total Procedure Duration: 0 hours 10 minutes 55 seconds       [facility]

## 2014-09-14 NOTE — H&P (Signed)
  [facility]  [address] [place], [place] [address] Phone: [phone] Fax : [phone]  Primary Care Physician:  Lelon Huh, MD Primary Gastroenterologist:  Dr. Allen Norris  Pre-Procedure History & Physical: HPI:  Micheal Mccoy is a 59 y.o. male is here for a screening colonoscopy.   Past Medical History  Diagnosis Date  . Peyronie's disease     W/ PENILE CURVATURE  . Frequency of urination   . Urgency of urination   . GERD (gastroesophageal reflux disease)   . BPH (benign prostatic hypertrophy)   . Arthritis   . Degenerative disc disease, cervical     Past Surgical History  Procedure Laterality Date  . Nesbit procedure N/A 06/28/2012    Procedure: 16 DOT PLACTATION PROCEDURE;  Surgeon: Claybon Jabs, MD;  Location: [facility];  Service: Urology;  Laterality: N/A;  . Peyronies disease  06/2012  . Colonoscopy  06/2004    Done by Dr. Bary Castilla. No polyps    Prior to Admission medications   Medication Sig Start Date End Date Taking? Authorizing Provider  tadalafil (CIALIS) 5 MG tablet Take 1 tablet (5 mg total) by mouth daily. For BPH 08/25/14   [name], MD    Allergies as of 09/06/2014  . (No Known Allergies)    History reviewed. No pertinent family history.  Social History   Social History  . Marital Status: Married    Spouse Name: N/A  . Number of Children: N/A  . Years of Education: N/A   Occupational History  . Associate Professor    Social History Main Topics  . Smoking status: Never Smoker   . Smokeless tobacco: Never Used  . Alcohol Use: No  . Drug Use: No  . Sexual Activity: Not on file   Other Topics Concern  . Not on file   Social History Narrative    Review of Systems: See HPI, otherwise negative ROS  Physical Exam: Ht 6\' 3"  (1.905 m)  Wt 198 lb (89.812 kg)  BMI 24.75 kg/m2 General:   Alert,  pleasant and cooperative in NAD Head:  Normocephalic and atraumatic. Neck:  Supple; no masses or  thyromegaly. Lungs:  Clear throughout to auscultation.    Heart:  Regular rate and rhythm. Abdomen:  Soft, nontender and nondistended. Normal bowel sounds, without guarding, and without rebound.   Neurologic:  Alert and  oriented x4;  grossly normal neurologically.  Impression/Plan: Micheal Mccoy is now here to undergo a screening colonoscopy.  Risks, benefits, and alternatives regarding colonoscopy have been reviewed with the patient.  Questions have been answered.  All parties agreeable.

## 2014-09-15 ENCOUNTER — Encounter: Payer: Self-pay | Admitting: Gastroenterology

## 2014-09-17 ENCOUNTER — Encounter: Payer: Self-pay | Admitting: Family Medicine

## 2014-09-18 ENCOUNTER — Encounter: Payer: Self-pay | Admitting: Gastroenterology

## 2015-01-17 ENCOUNTER — Ambulatory Visit
Admission: RE | Admit: 2015-01-17 | Discharge: 2015-01-17 | Disposition: A | Discharge status: Home / Self Care | Payer: BC Managed Care – PPO | Source: Ambulatory Visit | Attending: Family Medicine | Admitting: Family Medicine

## 2015-01-17 ENCOUNTER — Encounter: Payer: Self-pay | Admitting: Family Medicine

## 2015-01-17 ENCOUNTER — Ambulatory Visit (INDEPENDENT_AMBULATORY_CARE_PROVIDER_SITE_OTHER): Payer: BC Managed Care – PPO | Admitting: Family Medicine

## 2015-01-17 VITALS — BP 130/84 | HR 66 | Temp 99.0°F | Resp 16 | Ht 75.0 in | Wt 197.0 lb

## 2015-01-17 DIAGNOSIS — M545 Low back pain: Secondary | ICD-10-CM | POA: Insufficient documentation

## 2015-01-17 DIAGNOSIS — G8929 Other chronic pain: Secondary | ICD-10-CM

## 2015-01-17 DIAGNOSIS — M542 Cervicalgia: Secondary | ICD-10-CM | POA: Insufficient documentation

## 2015-01-17 DIAGNOSIS — M5441 Lumbago with sciatica, right side: Secondary | ICD-10-CM

## 2015-01-17 DIAGNOSIS — N486 Induration penis plastica: Secondary | ICD-10-CM | POA: Diagnosis not present

## 2015-01-17 DIAGNOSIS — N529 Male erectile dysfunction, unspecified: Secondary | ICD-10-CM

## 2015-01-17 DIAGNOSIS — R350 Frequency of micturition: Secondary | ICD-10-CM

## 2015-01-17 DIAGNOSIS — M5136 Other intervertebral disc degeneration, lumbar region: Secondary | ICD-10-CM

## 2015-01-17 LAB — POCT URINALYSIS DIPSTICK
BILIRUBIN UA: NEGATIVE
Glucose, UA: NEGATIVE
LEUKOCYTES UA: NEGATIVE
NITRITE UA: NEGATIVE
PH UA: 5
PROTEIN UA: NEGATIVE
Spec Grav, UA: >=1.03
Urobilinogen, UA: 0.2

## 2015-01-17 MED ORDER — TADALAFIL 5 MG PO TABS: 5.0000 mg | ORAL_TABLET | Freq: Every day | ORAL | Status: DC

## 2015-01-17 NOTE — Progress Notes (Signed)
Patient: Micheal Mccoy Male    DOB: 02/19/1955   60 y.o.   MRN: [id number] Visit Date: 01/17/2015  Today's Provider: Lelon Huh, MD   Chief Complaint  Patient presents with  . Urinary Retention  . Urinary Frequency   Subjective:    Urinary Frequency  This is a new problem. The current episode started more than 1 month ago (several months). The problem occurs every urination. The problem has been gradually worsening. The quality of the pain is described as aching. The pain is mild. There has been no fever. Associated symptoms include flank pain, frequency, hesitancy and urgency. Pertinent negatives include no chills, discharge, hematuria, nausea, possible pregnancy, sweats or vomiting. He has tried nothing for the symptoms.   Has had difficulty with urination and frequency for several months. Feels pressure in his lower abdomin when trying to urinate. Has lower back pain when he holds his urine to long. Also has felt two knots above pelvic area.  He was prescribed daily Cialis in 2015 which was fairly effective and restarted when he was here for his CPE in August. He states symptoms initially improve, but has since worsened again. His PSA has been stable in the 2.5 to 4.0 range for the last 4 years.   He does have a history of Peyronie's  And had corrective surgery at [facility] in 2014, and symptoms seemed to have gotten worse since then.  Low back pain States he gets severe pain right lower back radiating lateral aspect of right leg when driving for long perdiods. Is having tingling off on on down left arm, has history DDD in c-spine for many years.  Occasionally take ibuprofen which helps for a few hours.   Lab Results  Component Value Date   PSA 4.0 03/25/2013     No Known Allergies Previous Medications   TADALAFIL (CIALIS) 5 MG TABLET    Take 1 tablet (5 mg total) by mouth daily. For BPH    Review of Systems  Constitutional: Negative for fever, chills and  appetite change.  Respiratory: Negative for chest tightness, shortness of breath and wheezing.   Cardiovascular: Negative for chest pain and palpitations.  Gastrointestinal: Negative for nausea, vomiting and abdominal pain.  Genitourinary: Positive for hesitancy, urgency, frequency, flank pain and difficulty urinating. Negative for hematuria.       Does have some urine leakage  Musculoskeletal: Positive for back pain.    Social History  Substance Use Topics  . Smoking status: Never Smoker   . Smokeless tobacco: Never Used  . Alcohol Use: No   Objective:   BP 130/84 mmHg  Pulse 66  Temp(Src) 99 F (37.2 C) (Oral)  Resp 16  Ht 6\' 3"  (1.905 m)  Wt 197 lb (89.359 kg)  BMI 24.62 kg/m2  SpO2 100%  Physical Exam   General Appearance:    Alert, cooperative, no distress  Eyes:    PERRL, conjunctiva/corneas clear, EOM's intact       Lungs:     Clear to auscultation bilaterally, respirations unlabored  Heart:    Regular rate and rhythm  Neurologic:   Awake, alert, oriented x 3. No apparent focal neurological           defect.   MS:   Mildly tender lumbar spine and cervical spine. +5 MS UEs and LEs   Results for orders placed or performed in visit on 01/17/15  POCT urinalysis dipstick  Result Value Ref Range  Color, UA Dark Yellow    Clarity, UA Cloudy    Glucose, UA Neg    Bilirubin, UA Neg    Ketones, UA Moderate    Spec Grav, UA >=1.030    Blood, UA Moderate    pH, UA 5.0    Protein, UA Neg    Urobilinogen, UA 0.2    Nitrite, UA Neg    Leukocytes, UA Negative Negative       Assessment & Plan:     1. Urinary frequency BPH versus urethral structure.  - POCT urinalysis dipstick - Ambulatory referral to Urology  2. Peyronie's disease S/p corrective surgery in 2012  - Ambulatory referral to Urology  3. Erectile dysfunction, unspecified erectile dysfunction type Cialis is working fairly well.  - tadalafil (CIALIS) 5 MG tablet; Take 1 tablet (5 mg total) by mouth  daily. For BPH  Dispense: 90 tablet; Refill: 3  4. Neck pain, chronic  - DG Cervical Spine Complete; Future  5. Low back pain with radiation, right  - DG Lumbar Spine Complete; Future       Lelon Huh, MD  [facility]

## 2015-01-18 ENCOUNTER — Telehealth: Payer: Self-pay | Admitting: Family Medicine

## 2015-01-18 DIAGNOSIS — M545 Low back pain, unspecified: Secondary | ICD-10-CM | POA: Insufficient documentation

## 2015-01-18 DIAGNOSIS — M5136 Other intervertebral disc degeneration, lumbar region: Secondary | ICD-10-CM | POA: Insufficient documentation

## 2015-01-18 DIAGNOSIS — M542 Cervicalgia: Secondary | ICD-10-CM

## 2015-01-18 DIAGNOSIS — G8929 Other chronic pain: Secondary | ICD-10-CM

## 2015-01-18 NOTE — Telephone Encounter (Signed)
Patient stated that he would also like to try physical therapy to help with he neck and back pain. Patient said that he had PT for his neck pain years ago and it really seemed to help. Please advise?

## 2015-01-18 NOTE — Telephone Encounter (Signed)
Please refer PY for low back and neck pain from DDD. Thanks.

## 2015-01-18 NOTE — Telephone Encounter (Signed)
Patient was notified of results. Expressed understanding.  

## 2015-01-18 NOTE — Telephone Encounter (Signed)
-----   Message from [name], MD sent at 01/18/2015  7:40 AM EST ----- Degenerative disc disease and arthritis in low back and neck. Recommend he take OTC Aleve 1-2 tablets twice every \ day for this.

## 2015-01-24 ENCOUNTER — Other Ambulatory Visit: Payer: Self-pay | Admitting: Family Medicine

## 2015-01-24 NOTE — Telephone Encounter (Signed)
Pt needs refill for Cialis 5mg  .  [facility] uses [facility]  Call back is [phone]  Thanks Con Memos

## 2015-01-24 NOTE — Telephone Encounter (Signed)
Patient needs rx for [facility]ialis [facility] to [facility] [address]

## 2015-01-25 ENCOUNTER — Encounter: Payer: Self-pay | Admitting: Urology

## 2015-01-25 ENCOUNTER — Ambulatory Visit (INDEPENDENT_AMBULATORY_CARE_PROVIDER_SITE_OTHER): Payer: BC Managed Care – PPO | Admitting: Urology

## 2015-01-25 VITALS — BP 131/78 | HR 78 | Ht 75.0 in | Wt 198.2 lb

## 2015-01-25 DIAGNOSIS — R350 Frequency of micturition: Secondary | ICD-10-CM | POA: Diagnosis not present

## 2015-01-25 DIAGNOSIS — R3129 Other microscopic hematuria: Secondary | ICD-10-CM

## 2015-01-25 DIAGNOSIS — N400 Enlarged prostate without lower urinary tract symptoms: Secondary | ICD-10-CM

## 2015-01-25 DIAGNOSIS — N529 Male erectile dysfunction, unspecified: Secondary | ICD-10-CM

## 2015-01-25 DIAGNOSIS — N486 Induration penis plastica: Secondary | ICD-10-CM | POA: Diagnosis not present

## 2015-01-25 LAB — MICROSCOPIC EXAMINATION
EPITHELIAL CELLS (NON RENAL): NONE SEEN /HPF (ref 0–10)
WBC, UA: NONE SEEN /hpf (ref 0–?)

## 2015-01-25 LAB — URINALYSIS, COMPLETE
Bilirubin, UA: NEGATIVE
Glucose, UA: NEGATIVE
LEUKOCYTES UA: NEGATIVE
Nitrite, UA: NEGATIVE
PH UA: 7 (ref 5.0–7.5)
Specific Gravity, UA: 1.02 (ref 1.005–1.030)
Urobilinogen, Ur: 2 mg/dL — ABNORMAL HIGH (ref 0.2–1.0)

## 2015-01-25 LAB — BLADDER SCAN AMB NON-IMAGING: Scan Result: 11

## 2015-01-25 MED ORDER — FINASTERIDE 5 MG PO TABS: 5.0000 mg | ORAL_TABLET | Freq: Every day | ORAL | Status: DC

## 2015-01-25 MED ORDER — SILODOSIN 8 MG PO CAPS: 8.0000 mg | ORAL_CAPSULE | Freq: Every day | ORAL | Status: DC

## 2015-01-25 NOTE — Progress Notes (Signed)
Bladder Scan Patient void: 33ml Performed By: Larna Daughters

## 2015-01-25 NOTE — Progress Notes (Signed)
01/25/2015 4:17 PM   Micheal Mccoy 05/01/1955 [id number]  Referring provider: [name], MD [address] [place],  [address]  Chief Complaint  Patient presents with  . New Patient (Initial Visit)    urinary frequency, peyriones disease    HPI: The patient is a 60 year old male with a past medical history of Peyronie's disease status post plication by Dr. Karsten Ro in 2014 who presents with urinary frequency. The patient's eye PSS scores 28. He has fours and fives every category except for nocturia which he rates his twice per night. He finds is very bothersome to his way of life. He is on Cialis 5 mg daily for erections and BPH. He feels it helps his erectile dysfunction but not his BPH. His PVR today is 11. He also was noted to have microscopic hematuria on her urinalysis today.    He had a PSA of 4.0 in 2015 and 3.9 in September 2016.   PVR: 11 cc  PMH: Past Medical History  Diagnosis Date  . Peyronie's disease     W/ PENILE CURVATURE  . Frequency of urination   . Urgency of urination   . GERD (gastroesophageal reflux disease)   . BPH (benign prostatic hypertrophy)   . Arthritis   . Degenerative disc disease, cervical     Surgical History: Past Surgical History  Procedure Laterality Date  . Nesbit procedure N/A 06/28/2012    Procedure: 16 DOT PLACTATION PROCEDURE;  Surgeon: Claybon Jabs, MD;  Location: [facility];  Service: Urology;  Laterality: N/A;  . Peyronies disease  06/2012  . Colonoscopy  06/2004    Done by Dr. Bary Castilla. No polyps  . Colonoscopy with propofol N/A 09/14/2014    Procedure: COLONOSCOPY WITH PROPOFOL;  Surgeon: Lucilla Lame, MD;  Location: [facility];  Service: Endoscopy;  Laterality: N/A;  . Polypectomy  09/14/2014    Procedure: POLYPECTOMY;  Surgeon: Lucilla Lame, MD;  Location: [place];  Service: Endoscopy;;    Home Medications:    Medication List       This list is accurate as of:  01/25/15  4:17 PM.  Always use your most recent med list.               CIALIS 5 MG tablet  Generic drug:  tadalafil  TAKE 1 TABLET BY MOUTH EVERY DAY FOR BPH     finasteride 5 MG tablet  Commonly known as:  PROSCAR  Take 1 tablet (5 mg total) by mouth daily.     silodosin 8 MG Caps capsule  Commonly known as:  RAPAFLO  Take 1 capsule (8 mg total) by mouth daily with breakfast.        Allergies: No Known Allergies  Family History: Family History  Problem Relation Age of Onset  . Prostate cancer Neg Hx   . Bladder Cancer Neg Hx   . Kidney cancer Neg Hx     Social History:  reports that he has never smoked. He has never used smokeless tobacco. He reports that he does not drink alcohol or use illicit drugs.  ROS: UROLOGY Frequent Urination?: No Hard to postpone urination?: No Burning/pain with urination?: No Get up at night to urinate?: No Leakage of urine?: No Urine stream starts and stops?: No Trouble starting stream?: No Do you have to strain to urinate?: No Blood in urine?: No Urinary tract infection?: No Sexually transmitted disease?: No Injury to kidneys or bladder?: No Painful intercourse?: No  Weak stream?: No Erection problems?: No Penile pain?: No  Gastrointestinal Nausea?: No Vomiting?: No Indigestion/heartburn?: No Diarrhea?: No Constipation?: No  Constitutional Fever: No Night sweats?: No Weight loss?: No Fatigue?: No  Skin Skin rash/lesions?: No Itching?: No  Eyes Blurred vision?: Yes Double vision?: No  Ears/Nose/Throat Sore throat?: No Sinus problems?: No  Hematologic/Lymphatic Swollen glands?: No Easy bruising?: No  Cardiovascular Leg swelling?: No Chest pain?: No  Respiratory Cough?: No Shortness of breath?: No  Endocrine Excessive thirst?: No  Musculoskeletal Back pain?: Yes Joint pain?: No  Neurological Headaches?: No Dizziness?: No  Psychologic Depression?: No Anxiety?: No  Physical Exam: BP  131/78 mmHg  Pulse 78  Ht 6\' 3"  (1.905 m)  Wt 198 lb 3.2 oz (89.903 kg)  BMI 24.77 kg/m2  Constitutional:  Alert and oriented, No acute distress. HEENT: [place] AT, moist mucus membranes.  Trachea midline, no masses. Cardiovascular: No clubbing, cyanosis, or edema. Respiratory: Normal respiratory effort, no increased work of breathing. GI: Abdomen is soft, nontender, nondistended, no abdominal masses GU: No CVA tenderness. Normal phallus. Testicles and equal bilaterally no masses. DRE: 3+, smooth no nodules. Skin: No rashes, bruises or suspicious lesions. Lymph: No cervical or inguinal adenopathy. Neurologic: Grossly intact, no focal deficits, moving all 4 extremities. Psychiatric: Normal mood and affect.  Laboratory Data: Lab Results  Component Value Date   HGB 15.9 06/28/2012    Lab Results  Component Value Date   CREATININE 0.83 08/25/2014    Lab Results  Component Value Date   PSA 4.0 03/25/2013    No results found for: TESTOSTERONE  No results found for: HGBA1C  Urinalysis    Component Value Date/Time   BILIRUBINUR Neg 01/17/2015 1410   PROTEINUR Neg 01/17/2015 1410   UROBILINOGEN 0.2 01/17/2015 1410   NITRITE Neg 01/17/2015 1410   LEUKOCYTESUR Negative 01/17/2015 1410     Assessment & Plan:    I discussed with the patient the work up of microscopic hematuria. He understands workup includes a CT urogram and cystoscopy. We will arrange for this. I also discussed his significant lower urinary tract symptoms from his BPH. He has elected to start dual medical therapy for this. He understands the risk of orthostatic hypotension for the Rapaflo as well as decreased libido from the finasteride.  1. Asymptomatic microscopic hematuria -BMP CT urogram Cystoscopy after above  2. BPH with lower urinary tract symptoms -Rapaflo 8 mg daily Finasteride 5 mg daily next  3. Erectile dysfunction Continue Cialis  4. PSA screening Patient has borderline elevated PSA in the  past. PSA today.  5. Peyronie's disease. Stable status post plication.  Return in about 2 weeks (around 02/08/2015) for after ct urogram for cysto.  Nickie Retort, MD  [facility] [address] [place], [place] [address] [phone]

## 2015-01-26 LAB — PSA: Prostate Specific Ag, Serum: 4.2 ng/mL — ABNORMAL HIGH (ref 0.0–4.0)

## 2015-01-26 LAB — BASIC METABOLIC PANEL
BUN / CREAT RATIO: 13 (ref 10–22)
BUN: 14 mg/dL (ref 8–27)
CO2: 24 mmol/L (ref 18–29)
CREATININE: 1.05 mg/dL (ref 0.76–1.27)
Calcium: 9.9 mg/dL (ref 8.6–10.2)
Chloride: 102 mmol/L (ref 96–106)
GFR calc Af Amer: 88 mL/min/{1.73_m2} (ref >59)
GFR, EST NON AFRICAN AMERICAN: 76 mL/min/{1.73_m2} (ref >59)
GLUCOSE: 84 mg/dL (ref 65–99)
Potassium: 5.2 mmol/L (ref 3.5–5.2)
SODIUM: 143 mmol/L (ref 134–144)

## 2015-01-29 ENCOUNTER — Ambulatory Visit: Payer: BC Managed Care – PPO | Attending: Family Medicine

## 2015-01-29 DIAGNOSIS — M542 Cervicalgia: Secondary | ICD-10-CM | POA: Insufficient documentation

## 2015-01-29 DIAGNOSIS — M5441 Lumbago with sciatica, right side: Secondary | ICD-10-CM | POA: Insufficient documentation

## 2015-01-29 NOTE — Patient Instructions (Signed)
[email or web address] Prone press up or standing lumbar extension on wall x 10 every other waking hour Lumbar support when seated

## 2015-01-29 NOTE — Therapy (Signed)
[place] MAIN [facility] SERVICES [address] [place], [place], [address] Phone: [phone]   Fax:  [phone]  Physical Therapy Evaluation  Patient Details  Name: Micheal Mccoy MRN: [id number] Date of Birth: March 20, 1955 Referring Provider: D. Fisher  Encounter Date: 01/29/2015      PT End of Session - 01/29/15 1001    Visit Number 1   Number of Visits 9   Date for PT Re-Evaluation 03/01/15   PT Start Time 0810   PT Stop Time 0911   PT Time Calculation (min) 61 min   Activity Tolerance Patient tolerated treatment well   Behavior During Therapy [facility] for tasks assessed/performed      Past Medical History  Diagnosis Date  . Peyronie's disease     W/ PENILE CURVATURE  . Frequency of urination   . Urgency of urination   . GERD (gastroesophageal reflux disease)   . BPH (benign prostatic hypertrophy)   . Arthritis   . Degenerative disc disease, cervical     Past Surgical History  Procedure Laterality Date  . Nesbit procedure N/A 06/28/2012    Procedure: 16 DOT PLACTATION PROCEDURE;  Surgeon: Claybon Jabs, MD;  Location: [facility];  Service: Urology;  Laterality: N/A;  . Peyronies disease  06/2012  . Colonoscopy  06/2004    Done by Dr. Bary Castilla. No polyps  . Colonoscopy with propofol N/A 09/14/2014    Procedure: COLONOSCOPY WITH PROPOFOL;  Surgeon: Lucilla Lame, MD;  Location: [place];  Service: Endoscopy;  Laterality: N/A;  . Polypectomy  09/14/2014    Procedure: POLYPECTOMY;  Surgeon: Lucilla Lame, MD;  Location: [place];  Service: Endoscopy;;    There were no vitals filed for this visit.  Visit Diagnosis:  Right-sided low back pain with right-sided sciatica - Plan: PT plan of care cert/re-cert  Cervicalgia - Plan: PT plan of care cert/re-cert      Subjective Assessment - 01/29/15 0821    Subjective (p) pt reports having DDD in the cervical spine that sometimes causes pain down the LUE. he  has been treated with traction in the past which he reports helped. pt reports also having intermittant back pain which he reports can just come on all of a sudden. he reports the pain in his back also shoots down his RLE when sitting sometimes below the knee. pt reports the pain can go down to the foot at times. pt reports sitting aggrevates his pain in the leg the worse causing him to shift in sitting. pt reports he does not feel the leg pain as much when he is up walking around. pt reports this has been going on for 10+ years.    Limitations (p) Sitting   How long can you sit comfortably? (p) depends on the chair, but sometimes up to 30 min   Diagnostic tests (p) Xray DDD, no MRI   Currently in Pain? (p) Yes   Pain Score (p) 1    Pain Location (p) --  R buttocks to mid thigh.             [facility] PT Assessment - 01/29/15 0001    Assessment   Medical Diagnosis neck pain,    Referring Provider D. Fisher   Onset Date/Surgical Date 01/28/05   Prior Therapy PT   Precautions   Precautions None   Restrictions   Weight Bearing Restrictions No   Balance Screen   Has the patient fallen in the past  6 months No   Has the patient had a decrease in activity level because of a fear of falling?  No   Is the patient reluctant to leave their home because of a fear of falling?  No   Prior Function   Level of Independence Independent;Independent with household mobility with device;Independent with basic ADLs   Vocation Full time employment        POSTURE/OBSERVATION: Pt sits shifted to the L . Reduced lumbar lordosis  PROM/AROM: Cervical AROM: Flexion WNL, Extension 34 deg, L rotation 35 deg, R Rotation 64 deg, L sidebend 35 deg, R sidebend 25 deg Lumbar AROM: flexion WNL with peripheralizing increased pain. Extension mod loss with no pain, improved to min loss following PPU x 10, L / R sidebend WNL with pain with L sidebend  Myotomes, Dermatomes were WNL bilaterally   STRENGTH:  Graded on a  0-5 scale Muscle Group Left Right                          Hip Flex 4+ 4+  Hip Abd 4+ 4 with pain  Hip Add 4 with pain on the R 4  Hip Ext 4 4  Hip IR/ER    Knee Flex 5 5  Knee Ext 5 5  Ankle DF 5 5  Ankle PF 5 5   SENSATION: WNL bilaterally  SPECIAL TESTS: Sacral thrust (-) (-) Slump  Palpation: Increased tone R paraspinals and QL Pain with CPA to L 3-5 Pain R SIJ  FUNCTIONAL MOBILITY: Pt is independent with bed mobility and transfers      GAIT: reduced lumbar lordosis    Therex Prone on elbows- pt reports no pain PPU x 10, followed by sitting with lumbar support, pain is centralized to lumbar spine at 0/10 pain Standing lumbar extension on wall x 10 for HEP Pt requires min verbal and tactile cues for proper exercise performance                          PT Education - 01/29/15 1000    Education provided Yes   Education Details plan of care. exam findings. prinicples of centralizaion / peripheralization    Person(s) Educated Patient   Methods Explanation   Comprehension Verbalized understanding             PT Long Term Goals - 01/29/15 1008    PT LONG TERM GOAL #1   Title pt will be independent and compliant with HEP   Time 4   Period Weeks   Status New   PT LONG TERM GOAL #2   Title pt will report no more than 2/10 LBP/ leg pain for 1 or more weeks   Time 4   Period Weeks   Status New   PT LONG TERM GOAL #3   Title pt will be able to sit with proper posture and lumbar support x 1 hr without pain   Time 4   Period Weeks   Status New   PT LONG TERM GOAL #4   Title pt will improve cervical ROM rotation by 10 deg bilaterally to aid with driving    Time 4   Period Weeks   Status New               Plan - 01/29/15 1001    Clinical Impression Statement pt presents with long standing history of chronic neck pain and chronic episodic LBP  with associated RLE pain. PT exam focused on LBP primarily today. Exam revealed  LBP aggrevated and peripheralized by flexion based activities and releaved to 0/10 with extension based lumbar activities today suggesting lumbar derangement with extension preference. pt also demonstrates core weakness with over active paraspinals and hip weakness..    Pt will benefit from skilled therapeutic intervention in order to improve on the following deficits Decreased range of motion;Decreased mobility;Decreased strength;Difficulty walking;Hypomobility;Pain;Impaired flexibility;Increased muscle spasms;Postural dysfunction;Improper body mechanics   Rehab Potential Good   Clinical Impairments Affecting Rehab Potential 10+ years of pain, history of DDD / positive: regular physical activity   PT Frequency 2x / week   PT Duration 4 weeks   PT Treatment/Interventions ADLs/Self Care Home Management;Aquatic Therapy;Cryotherapy;Electrical Stimulation;Moist Heat;Traction;Ultrasound;Patient/family education;Neuromuscular re-education;Therapeutic exercise;Gait training;Manual techniques;Passive range of motion;Dry needling;Taping   PT Next Visit Plan reassess peripheral symptoms         Problem List Patient Active Problem List   Diagnosis Date Noted  . DDD (degenerative disc disease), lumbar 01/18/2015  . Low back pain 01/18/2015  . Urinary frequency 01/17/2015  . Neck pain, chronic 01/17/2015  . Benign neoplasm of ascending colon   . Arthritis 08/24/2014  . BPH (benign prostatic hypertrophy) 08/24/2014  . Penile mass 08/24/2014  . Erectile dysfunction 08/24/2014  . Fecal occult blood test positive 08/24/2014  . Peyronie's disease 06/28/2012  . Cervicalgia 07/12/2007  . Lumbago 07/12/2007  . Dizziness and giddiness 08/18/2006  . Esophagitis, reflux 09/29/2005  . Cervical spondylosis without myelopathy 01/06/1998   Caryl Pina C. Mckinley Adelstein, PT, DPT [phone]  Teniyah Seivert 01/29/2015, 10:16 AM  [place] MAIN [facility] SERVICES [address]  [place], [place], [address] Phone: [phone]   Fax:  [phone]  Name: FORESTER ENDRIZZI MRN: [id number] Date of Birth: 1955/06/12

## 2015-01-31 ENCOUNTER — Ambulatory Visit: Payer: BC Managed Care – PPO

## 2015-01-31 DIAGNOSIS — M542 Cervicalgia: Secondary | ICD-10-CM

## 2015-01-31 DIAGNOSIS — M5441 Lumbago with sciatica, right side: Secondary | ICD-10-CM | POA: Diagnosis not present

## 2015-01-31 NOTE — Therapy (Signed)
[place] MAIN [facility] SERVICES [address] [place], [place], [address] Phone: [phone]   Fax:  [phone]  Physical Therapy Treatment  Patient Details  Name: Micheal Mccoy MRN: [id number] Date of Birth: [birth year] Referring Provider: D. Fisher  Encounter Date: 01/31/2015      PT End of Session - 01/31/15 0946    Visit Number 2   Number of Visits 9   Date for PT Re-Evaluation 03/01/15   PT Start Time 0805   PT Stop Time 0853   PT Time Calculation (min) 48 min   Activity Tolerance Patient tolerated treatment well   Behavior During Therapy [facility] for tasks assessed/performed      Past Medical History  Diagnosis Date  . Peyronie's disease     W/ PENILE CURVATURE  . Frequency of urination   . Urgency of urination   . GERD (gastroesophageal reflux disease)   . BPH (benign prostatic hypertrophy)   . Arthritis   . Degenerative disc disease, cervical     Past Surgical History  Procedure Laterality Date  . Nesbit procedure N/A 06/28/2012    Procedure: 16 DOT PLACTATION PROCEDURE;  Surgeon: Claybon Jabs, MD;  Location: [facility];  Service: Urology;  Laterality: N/A;  . Peyronies disease  06/2012  . Colonoscopy  06/2004    Done by Dr. Bary Castilla. No polyps  . Colonoscopy with propofol N/A 09/14/2014    Procedure: COLONOSCOPY WITH PROPOFOL;  Surgeon: Lucilla Lame, MD;  Location: [place];  Service: Endoscopy;  Laterality: N/A;  . Polypectomy  09/14/2014    Procedure: POLYPECTOMY;  Surgeon: Lucilla Lame, MD;  Location: [place];  Service: Endoscopy;;    There were no vitals filed for this visit.  Visit Diagnosis:  Right-sided low back pain with right-sided sciatica  Cervicalgia      Subjective Assessment - 01/31/15 0944    Subjective pt rpeorts he has been compliant to HEP. He reports standing feels better. pt reports he feels his back / leg pain seemed to come on quicker when in the car sitting with  lumbar roll.   Limitations Sitting   How long can you sit comfortably? depends on the chair, but sometimes up to 30 min   Diagnostic tests Xray DDD, no MRI   Currently in Pain? Yes   Pain Score 2    Pain Location --  sitting in chair RLE to thigh     Therex: PT re-evaluated pts recent symptoms and history Pt observed sitting with lateral shift SIJ testing: LLL negative, SIJ compresson/dystraction both negative, figure 4 negative, sacral shear test negative, sacral thrust test negative Piriformis palpation slightly tender but no peripheral symptoms, no pain with resisted hip ER bilaterally (-) Slump test bilaterally, negative femoral nerve tension test bilaterally, negative SLR test bilaterally PPU with PT over pressure and small oscillations at end range x 10 following manual therapy   Manual therapy: CPA mobilizations to L 1-3 2 bouts of 30s  At Grade III CPA mobilizations at L 4-5 grade 1 due to pain and muscle guarding/spasm 3 bouts of 20s  PT assessed thoracolumbar fascia, which was WNL  Estim: Hi-Volt Estim provided to bilateral lumbar paraspinals - continuous 60-39mA x 15 min in prone for reducing muscle spasm                          PT Education - 01/31/15 0945    Education provided Yes  Education Details Theraputic neuroscience education. posture edcuation with or without use of lumbar roll- goal is to maintain normal lordodic curve    Person(s) Educated Patient   Methods Explanation   Comprehension Verbalized understanding             PT Long Term Goals - 01/29/15 1008    PT LONG TERM GOAL #1   Title pt will be independent and compliant with HEP   Time 4   Period Weeks   Status New   PT LONG TERM GOAL #2   Title pt will report no more than 2/10 LBP/ leg pain for 1 or more weeks   Time 4   Period Weeks   Status New   PT LONG TERM GOAL #3   Title pt will be able to sit with proper posture and lumbar support x 1 hr without pain    Time 4   Period Weeks   Status New   PT LONG TERM GOAL #4   Title pt will improve cervical ROM rotation by 10 deg bilaterally to aid with driving    Time 4   Period Weeks   Status New               Plan - 01/31/15 [id number]    Clinical Impression Statement PT performed examination/special testing today which did seem to rule out SIJ dysfunction, piriformis syndrome which would likely be secondary causes of sciatic pain. pt still shows a strong extesion preference, and demonstrates a R lateral shift in sitting away from the painful side. Pt education today to re-itterate the purpose of sitting with lumbar lordosis due to his extension preference and liklihood of ruling out other pathologies that can attribute to a similar pain. pt had more tenderness of the lower lumbar spine and segmental hypomobility in that area observed with less skin folds with prone press up. grade 1 mobilizations provided to this area due to muscle spasm with palpation followed by Hi-volt Estim which pt found improved his tightness. pt also had improved lumar extension following therapy. pt instructed on Theraputic neuroscience education today as the duration of his symptoms are chronic and likelyhood has upregulation of the nervous system. pt follows education well and reports he will try to be more compliant with his sitting posture.    Pt will benefit from skilled therapeutic intervention in order to improve on the following deficits Decreased range of motion;Decreased mobility;Decreased strength;Difficulty walking;Hypomobility;Pain;Impaired flexibility;Increased muscle spasms;Postural dysfunction;Improper body mechanics   Rehab Potential Good   Clinical Impairments Affecting Rehab Potential 10+ years of pain, history of DDD / positive: regular physical activity   PT Frequency 2x / week   PT Duration 4 weeks   PT Treatment/Interventions ADLs/Self Care Home Management;Aquatic Therapy;Cryotherapy;Electrical  Stimulation;Moist Heat;Traction;Ultrasound;Patient/family education;Neuromuscular re-education;Therapeutic exercise;Gait training;Manual techniques;Passive range of motion;Dry needling;Taping   PT Next Visit Plan reassess peripheral symptoms        Problem List Patient Active Problem List   Diagnosis Date Noted  . DDD (degenerative disc disease), lumbar 01/18/2015  . Low back pain 01/18/2015  . Urinary frequency 01/17/2015  . Neck pain, chronic 01/17/2015  . Benign neoplasm of ascending colon   . Arthritis 08/24/2014  . BPH (benign prostatic hypertrophy) 08/24/2014  . Penile mass 08/24/2014  . Erectile dysfunction 08/24/2014  . Fecal occult blood test positive 08/24/2014  . Peyronie's disease 06/28/2012  . Cervicalgia 07/12/2007  . Lumbago 07/12/2007  . Dizziness and giddiness 08/18/2006  . Esophagitis, reflux 09/29/2005  . Cervical spondylosis  without myelopathy 01/06/1998   Gorden Harms. Allenmichael Mcpartlin, PT, DPT [phone]  Mari Battaglia 01/31/2015, 9:58 AM  [place] MAIN [facility] SERVICES [address] [place], [place], [address] Phone: [phone]   Fax:  [phone]  Name: BRAVLIO MURAD MRN: [id number] Date of Birth: May 04, 1955

## 2015-02-02 ENCOUNTER — Ambulatory Visit
Admission: RE | Admit: 2015-02-02 | Discharge: 2015-02-02 | Disposition: A | Discharge status: Home / Self Care | Payer: BC Managed Care – PPO | Source: Ambulatory Visit | Attending: Urology | Admitting: Urology

## 2015-02-02 DIAGNOSIS — K4020 Bilateral inguinal hernia, without obstruction or gangrene, not specified as recurrent: Secondary | ICD-10-CM | POA: Insufficient documentation

## 2015-02-02 DIAGNOSIS — N400 Enlarged prostate without lower urinary tract symptoms: Secondary | ICD-10-CM | POA: Insufficient documentation

## 2015-02-02 DIAGNOSIS — K769 Liver disease, unspecified: Secondary | ICD-10-CM | POA: Diagnosis not present

## 2015-02-02 DIAGNOSIS — K7689 Other specified diseases of liver: Secondary | ICD-10-CM | POA: Diagnosis not present

## 2015-02-02 DIAGNOSIS — R911 Solitary pulmonary nodule: Secondary | ICD-10-CM | POA: Insufficient documentation

## 2015-02-02 DIAGNOSIS — R350 Frequency of micturition: Secondary | ICD-10-CM | POA: Diagnosis present

## 2015-02-02 DIAGNOSIS — N281 Cyst of kidney, acquired: Secondary | ICD-10-CM | POA: Diagnosis not present

## 2015-02-02 MED ORDER — IOHEXOL 300 MG/ML  SOLN
150.0000 mL | Freq: Once | INTRAMUSCULAR | Status: AC | PRN
  Administered 2015-02-02: 125 mL via INTRAVENOUS

## 2015-02-05 ENCOUNTER — Ambulatory Visit: Payer: BC Managed Care – PPO

## 2015-02-05 DIAGNOSIS — M5441 Lumbago with sciatica, right side: Secondary | ICD-10-CM

## 2015-02-05 DIAGNOSIS — M542 Cervicalgia: Secondary | ICD-10-CM

## 2015-02-05 NOTE — Therapy (Signed)
[place] MAIN [facility] SERVICES [address] [place], [place], [address] Phone: [phone]   Fax:  [phone]  Physical Therapy Treatment  Patient Details  Name: Micheal Mccoy MRN: [id number] Date of Birth: October 12, 1955 Referring Provider: D. Fisher  Encounter Date: 02/05/2015      PT End of Session - 02/05/15 0856    Visit Number 3   Number of Visits 9   Date for PT Re-Evaluation 03/01/15   PT Start Time 0810   PT Stop Time 0858   PT Time Calculation (min) 48 min   Activity Tolerance Patient tolerated treatment well   Behavior During Therapy [facility] for tasks assessed/performed      Past Medical History  Diagnosis Date  . Peyronie's disease     W/ PENILE CURVATURE  . Frequency of urination   . Urgency of urination   . GERD (gastroesophageal reflux disease)   . BPH (benign prostatic hypertrophy)   . Arthritis   . Degenerative disc disease, cervical     Past Surgical History  Procedure Laterality Date  . Nesbit procedure N/A 06/28/2012    Procedure: 16 DOT PLACTATION PROCEDURE;  Surgeon: Claybon Jabs, MD;  Location: [facility];  Service: Urology;  Laterality: N/A;  . Peyronies disease  06/2012  . Colonoscopy  06/2004    Done by Dr. Bary Castilla. No polyps  . Colonoscopy with propofol N/A 09/14/2014    Procedure: COLONOSCOPY WITH PROPOFOL;  Surgeon: Lucilla Lame, MD;  Location: [place];  Service: Endoscopy;  Laterality: N/A;  . Polypectomy  09/14/2014    Procedure: POLYPECTOMY;  Surgeon: Lucilla Lame, MD;  Location: [place];  Service: Endoscopy;;    There were no vitals filed for this visit.  Visit Diagnosis:  Right-sided low back pain with right-sided sciatica  Cervicalgia      Subjective Assessment - 02/05/15 0854    Subjective pt reports he is doing the exercises, they relieve the pressure in his lower back. He reports he is still having difficutly with RLE pain when sitting or standing. 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    Limitations Sitting   How long can you sit comfortably? depends on the chair, but sometimes up to 30 min   Diagnostic tests Xray DDD, no MRI   Currently in Pain? Yes   Pain Score 2    Pain Location --  lower back      Manual therapy: CPA mobilizations to L 1-3 2 bouts of 30s At Grade III CPA mobilizations at L 4-5 grade 1-2 due to pain and muscle guarding/spasm 3 bouts of 20s - muscle guarding was less vs last treatment today RLE long axis distraction. 20s hold x 10 s off x 10 min  Therex: Diaphragmatic breathing (initiating TA contraction) with mod cues for abdominal expansion Diaphragmatic breathing (initiating TA contraction) with SL hip ER/ER x 10 each side Diaphragmatic breathing (initiating TA contraction) with LTR x 10  Pt requires min verbal and tactile cues for proper exercise performance    Estim: Hi-Volt Estim provided to bilateral lumbar paraspinals - continuous 65-90A x 11 min in prone for reducing muscle spasm                           PT Education - 02/05/15 0856    Education provided Yes   Education Details diaphragmatic breathing.  Person(s) Educated Patient   Methods Explanation   Comprehension Verbalized understanding             PT Long Term Goals - 01/29/15 1008    PT LONG TERM GOAL #1   Title pt will be independent and compliant with HEP   Time 4   Period Weeks   Status New   PT LONG TERM GOAL #2   Title pt will report no more than 2/10 LBP/ leg pain for 1 or more weeks   Time 4   Period Weeks   Status New   PT LONG TERM GOAL #3   Title pt will be able to sit with proper posture and lumbar support x 1 hr without pain   Time 4   Period Weeks   Status New   PT LONG TERM GOAL #4   Title pt will improve cervical ROM rotation by 10 deg bilaterally to aid with driving    Time 4   Period Weeks   Status New                Plan - 02/05/15 0900    Clinical Impression Statement Pt responded well to PT this morning. responded well to long axis distraction with reduced tightness in the lower back. PT also initiated core strengthening today. pt has difficulty with diaphragmatic breathing, but is able to contract his TA well. pt had no increased pain with core exercises today. pt did not have any numbness today.    Pt will benefit from skilled therapeutic intervention in order to improve on the following deficits Decreased range of motion;Decreased mobility;Decreased strength;Difficulty walking;Hypomobility;Pain;Impaired flexibility;Increased muscle spasms;Postural dysfunction;Improper body mechanics   Rehab Potential Good   Clinical Impairments Affecting Rehab Potential 10+ years of pain, history of DDD / positive: regular physical activity   PT Frequency 2x / week   PT Duration 4 weeks   PT Treatment/Interventions ADLs/Self Care Home Management;Aquatic Therapy;Cryotherapy;Electrical Stimulation;Moist Heat;Traction;Ultrasound;Patient/family education;Neuromuscular re-education;Therapeutic exercise;Gait training;Manual techniques;Passive range of motion;Dry needling;Taping   PT Next Visit Plan reassess peripheral symptoms        Problem List Patient Active Problem List   Diagnosis Date Noted  . DDD (degenerative disc disease), lumbar 01/18/2015  . Low back pain 01/18/2015  . Urinary frequency 01/17/2015  . Neck pain, chronic 01/17/2015  . Benign neoplasm of ascending colon   . Arthritis 08/24/2014  . BPH (benign prostatic hypertrophy) 08/24/2014  . Penile mass 08/24/2014  . Erectile dysfunction 08/24/2014  . Fecal occult blood test positive 08/24/2014  . Peyronie's disease 06/28/2012  . Cervicalgia 07/12/2007  . Lumbago 07/12/2007  . Dizziness and giddiness 08/18/2006  . Esophagitis, reflux 09/29/2005  . Cervical spondylosis without myelopathy 01/06/1998   Caryl Pina C. Aveya Beal, PT, DPT  [phone]  Pranit Owensby 02/05/2015, 9:05 AM  [place] MAIN [facility] SERVICES [address] [place], [place], [address] Phone: [phone]   Fax:  [phone]  Name: Micheal Mccoy MRN: [id number] Date of Birth: [birth year]

## 2015-02-07 ENCOUNTER — Ambulatory Visit: Payer: BC Managed Care – PPO | Attending: Family Medicine

## 2015-02-07 DIAGNOSIS — M5441 Lumbago with sciatica, right side: Secondary | ICD-10-CM | POA: Diagnosis present

## 2015-02-07 DIAGNOSIS — M542 Cervicalgia: Secondary | ICD-10-CM | POA: Diagnosis present

## 2015-02-07 NOTE — Therapy (Signed)
[place] MAIN [facility] SERVICES [address] [place], [place], [address] Phone: [phone]   Fax:  [phone]  Physical Therapy Treatment  Patient Details  Name: Micheal Mccoy MRN: [id number] Date of Birth: 09-27-1955 Referring Provider: D. Fisher  Encounter Date: 02/07/2015      PT End of Session - 02/07/15 [id number]    Visit Number 4   Number of Visits 9   Date for PT Re-Evaluation 03/01/15   PT Start Time 0815   PT Stop Time 0855   PT Time Calculation (min) 40 min   Activity Tolerance Patient tolerated treatment well   Behavior During Therapy [facility] for tasks assessed/performed      Past Medical History  Diagnosis Date  . Peyronie's disease     W/ PENILE CURVATURE  . Frequency of urination   . Urgency of urination   . GERD (gastroesophageal reflux disease)   . BPH (benign prostatic hypertrophy)   . Arthritis   . Degenerative disc disease, cervical     Past Surgical History  Procedure Laterality Date  . Nesbit procedure N/A 06/28/2012    Procedure: 16 DOT PLACTATION PROCEDURE;  Surgeon: Claybon Jabs, MD;  Location: [facility];  Service: Urology;  Laterality: N/A;  . Peyronies disease  06/2012  . Colonoscopy  06/2004    Done by Dr. Bary Castilla. No polyps  . Colonoscopy with propofol N/A 09/14/2014    Procedure: COLONOSCOPY WITH PROPOFOL;  Surgeon: Lucilla Lame, MD;  Location: [place];  Service: Endoscopy;  Laterality: N/A;  . Polypectomy  09/14/2014    Procedure: POLYPECTOMY;  Surgeon: Lucilla Lame, MD;  Location: [place];  Service: Endoscopy;;    There were no vitals filed for this visit.  Visit Diagnosis:  Right-sided low back pain with right-sided sciatica  Cervicalgia      Subjective Assessment - 02/07/15 0916    Subjective Pt reports this morning his back is feeling a lot better. he feels the prone press ups are really helping. pt reports he is much more aware of his sitting posture and was  able to drive an hour without leg pain yesterday which he was pleased about    Limitations Sitting   How long can you sit comfortably? depends on the chair, but sometimes up to 30 min   Diagnostic tests Xray DDD, no MRI   Currently in Pain? No/denies      Manual therapy  RLE long axis distraction. 20s hold x 10 s off x 10 min R piriformis stretch 30s x 3 Passive R hip IR/ER with small oscillations at end range 3x10   Therex: Diaphragmatic breathing (initiating TA contraction) with SL hip ER/ER 3x 10 each side with red band around knees Diaphragmatic breathing (initiating TA contraction) with Bridging 3x10 with red band around knees Prone swimmers with TA activation 2x10 Pt requires min verbal and tactile cues for proper exercise performance   Estim: Hi-Volt Estim provided to bilateral lumbar paraspinals - continuous 90/115A x 10 min in prone for reducing muscle spasm                                  PT Education - 02/07/15 0917    Education provided Yes   Education Details progressed therex/HEP   Person(s) Educated Patient   Methods Explanation   Comprehension Verbalized understanding;Returned demonstration  PT Long Term Goals - 01/29/15 1008    PT LONG TERM GOAL #1   Title pt will be independent and compliant with HEP   Time 4   Period Weeks   Status New   PT LONG TERM GOAL #2   Title pt will report no more than 2/10 LBP/ leg pain for 1 or more weeks   Time 4   Period Weeks   Status New   PT LONG TERM GOAL #3   Title pt will be able to sit with proper posture and lumbar support x 1 hr without pain   Time 4   Period Weeks   Status New   PT LONG TERM GOAL #4   Title pt will improve cervical ROM rotation by 10 deg bilaterally to aid with driving    Time 4   Period Weeks   Status New               Plan - 02/07/15 [id number]    Clinical Impression Statement pt arrived 15 min late to apt. pt progressing well with  extension based/preference HEP and posture adjustment. pt also responds well to long axis distraction. progressed core/hip strengthening today without issue. only muscle fatigue.    Pt will benefit from skilled therapeutic intervention in order to improve on the following deficits Decreased range of motion;Decreased mobility;Decreased strength;Difficulty walking;Hypomobility;Pain;Impaired flexibility;Increased muscle spasms;Postural dysfunction;Improper body mechanics   Rehab Potential Good   Clinical Impairments Affecting Rehab Potential 10+ years of pain, history of DDD / positive: regular physical activity   PT Frequency 2x / week   PT Duration 4 weeks   PT Treatment/Interventions ADLs/Self Care Home Management;Aquatic Therapy;Cryotherapy;Electrical Stimulation;Moist Heat;Traction;Ultrasound;Patient/family education;Neuromuscular re-education;Therapeutic exercise;Gait training;Manual techniques;Passive range of motion;Dry needling;Taping   PT Next Visit Plan progress strengthening as tolerated         Problem List Patient Active Problem List   Diagnosis Date Noted  . DDD (degenerative disc disease), lumbar 01/18/2015  . Low back pain 01/18/2015  . Urinary frequency 01/17/2015  . Neck pain, chronic 01/17/2015  . Benign neoplasm of ascending colon   . Arthritis 08/24/2014  . BPH (benign prostatic hypertrophy) 08/24/2014  . Penile mass 08/24/2014  . Erectile dysfunction 08/24/2014  . Fecal occult blood test positive 08/24/2014  . Peyronie's disease 06/28/2012  . Cervicalgia 07/12/2007  . Lumbago 07/12/2007  . Dizziness and giddiness 08/18/2006  . Esophagitis, reflux 09/29/2005  . Cervical spondylosis without myelopathy 01/06/1998   [name], PT, DPT [phone]  [name] 02/07/2015, 9:26 AM  [place] MAIN [facility] SERVICES [address] [place], [place], [address] Phone: [phone]   Fax:  [phone]  Name: Micheal Mccoy MRN: [id number] Date of Birth: 1955-03-29

## 2015-02-12 ENCOUNTER — Ambulatory Visit: Payer: BC Managed Care – PPO

## 2015-02-14 ENCOUNTER — Ambulatory Visit: Payer: BC Managed Care – PPO

## 2015-02-15 ENCOUNTER — Ambulatory Visit (INDEPENDENT_AMBULATORY_CARE_PROVIDER_SITE_OTHER): Payer: BC Managed Care – PPO | Admitting: Urology

## 2015-02-15 ENCOUNTER — Encounter: Payer: Self-pay | Admitting: Urology

## 2015-02-15 VITALS — BP 124/87 | HR 65 | Ht 75.0 in | Wt 195.6 lb

## 2015-02-15 DIAGNOSIS — N528 Other male erectile dysfunction: Secondary | ICD-10-CM

## 2015-02-15 DIAGNOSIS — R911 Solitary pulmonary nodule: Secondary | ICD-10-CM

## 2015-02-15 DIAGNOSIS — N400 Enlarged prostate without lower urinary tract symptoms: Secondary | ICD-10-CM

## 2015-02-15 DIAGNOSIS — R3129 Other microscopic hematuria: Secondary | ICD-10-CM

## 2015-02-15 DIAGNOSIS — K4020 Bilateral inguinal hernia, without obstruction or gangrene, not specified as recurrent: Secondary | ICD-10-CM

## 2015-02-15 DIAGNOSIS — N281 Cyst of kidney, acquired: Secondary | ICD-10-CM

## 2015-02-15 DIAGNOSIS — K769 Liver disease, unspecified: Secondary | ICD-10-CM

## 2015-02-15 DIAGNOSIS — K7689 Other specified diseases of liver: Secondary | ICD-10-CM | POA: Diagnosis not present

## 2015-02-15 DIAGNOSIS — N486 Induration penis plastica: Secondary | ICD-10-CM

## 2015-02-15 DIAGNOSIS — Q6100 Congenital renal cyst, unspecified: Secondary | ICD-10-CM | POA: Diagnosis not present

## 2015-02-15 DIAGNOSIS — R972 Elevated prostate specific antigen [PSA]: Secondary | ICD-10-CM | POA: Diagnosis not present

## 2015-02-15 LAB — URINALYSIS, COMPLETE
BILIRUBIN UA: NEGATIVE
Glucose, UA: NEGATIVE
KETONES UA: NEGATIVE
LEUKOCYTES UA: NEGATIVE
Nitrite, UA: NEGATIVE
PH UA: 5.5 (ref 5.0–7.5)
Urobilinogen, Ur: 0.2 mg/dL (ref 0.2–1.0)

## 2015-02-15 LAB — MICROSCOPIC EXAMINATION
BACTERIA UA: NONE SEEN
RBC, UA: >30 /hpf — AB (ref 0–?)
WBC, UA: NONE SEEN /hpf (ref 0–?)

## 2015-02-15 MED ORDER — CIPROFLOXACIN HCL 500 MG PO TABS
500.0000 mg | ORAL_TABLET | Freq: Once | ORAL | Status: AC
  Administered 2015-02-15: 500 mg via ORAL

## 2015-02-15 MED ORDER — LIDOCAINE HCL 2 % EX GEL
1.0000 "application " | Freq: Once | CUTANEOUS | Status: AC
  Administered 2015-02-15: 1 via URETHRAL

## 2015-02-15 NOTE — Progress Notes (Signed)
02/15/2015 9:37 AM   [name] July 23, 1955 [id number]  Referring provider: [name], MD [address] [place], [place] [address]  Chief Complaint  Patient presents with  . Cysto    microscopic hematuria    HPI: The patient is a 60 year old male with a past medical history of Peyronie's disease status post plication by Dr. Karsten Ro in 2014 who presents with urinary frequency. The patient's IPSS score is 28. He has fours and fives every category except for nocturia which he rates his twice per night. He finds is very bothersome to his way of life. He is on Cialis 5 mg daily for erections and BPH. He feels it helps his erectile dysfunction but not his BPH. His PVR today is 11. He also was noted to have microscopic hematuria on her urinalysis today.   He had a PSA of 4.0 in 2015 and 3.9 in September 2016.  PSA 4.2 in January 2017.  PVR: 11 cc (January 2017)  CT Urogram showed 130 gm prostate. It also showed a benign Bosniak 2 left upper pole cyst. Incidentally, it showed a hypodense lesion in the liver as well as a non-calcified 6 mm left lower lobe pulmonary nodule that requires further follow-up.   Cystoscopy was negative for source of hematuria. He did have a visually occlusive prostate with a median lobe.  The patient is noted approximate 5-10% improvable in his urinary symptoms since starting Rapaflo and finasteride. I PSS score today is 26/6. He is miserable with his urinary symptoms. He still has nocturia and daytime frequency. He does notice slight improvement stream.  The patient does note retrograde ejaculation symptoms since starting Rapaflo. He is not interested in intercourse at this time because he is having pain and bulging during intercourse. This appears to be from his bilateral directing no hernias. He states is getting worse. He is interested in surgical consultation for possible correction.    PMH: Past Medical History  Diagnosis Date  .  Peyronie's disease     W/ PENILE CURVATURE  . Frequency of urination   . Urgency of urination   . GERD (gastroesophageal reflux disease)   . BPH (benign prostatic hypertrophy)   . Arthritis   . Degenerative disc disease, cervical   . Benign neoplasm of ascending colon   . Esophagitis, reflux 09/29/2005  . DDD (degenerative disc disease), lumbar 01/18/2015    Surgical History: Past Surgical History  Procedure Laterality Date  . Nesbit procedure N/A 06/28/2012    Procedure: 16 DOT PLACTATION PROCEDURE;  Surgeon: Claybon Jabs, MD;  Location: [facility];  Service: Urology;  Laterality: N/A;  . Peyronies disease  06/2012  . Colonoscopy  06/2004    Done by Dr. Bary Castilla. No polyps  . Colonoscopy with propofol N/A 09/14/2014    Procedure: COLONOSCOPY WITH PROPOFOL;  Surgeon: Lucilla Lame, MD;  Location: [place];  Service: Endoscopy;  Laterality: N/A;  . Polypectomy  09/14/2014    Procedure: POLYPECTOMY;  Surgeon: Lucilla Lame, MD;  Location: [place];  Service: Endoscopy;;    Home Medications:    Medication List       This list is accurate as of: 02/15/15  9:37 AM.  Always use your most recent med list.               CIALIS 5 MG tablet  Generic drug:  tadalafil  TAKE 1 TABLET BY MOUTH EVERY DAY FOR BPH     finasteride 5  MG tablet  Commonly known as:  PROSCAR  Take 1 tablet (5 mg total) by mouth daily.     silodosin 8 MG Caps capsule  Commonly known as:  RAPAFLO  Take 1 capsule (8 mg total) by mouth daily with breakfast.        Allergies: No Known Allergies  Family History: Family History  Problem Relation Age of Onset  . Prostate cancer Neg Hx   . Bladder Cancer Neg Hx   . Kidney cancer Neg Hx     Social History:  reports that he has never smoked. He has never used smokeless tobacco. He reports that he does not drink alcohol or use illicit drugs.  ROS: UROLOGY Frequent Urination?: Yes Hard to postpone urination?:  No Burning/pain with urination?: No Get up at night to urinate?: Yes Leakage of urine?: Yes Urine stream starts and stops?: Yes Trouble starting stream?: Yes Do you have to strain to urinate?: Yes Blood in urine?: Yes Urinary tract infection?: No Sexually transmitted disease?: No Injury to kidneys or bladder?: No Painful intercourse?: No Weak stream?: Yes Erection problems?: Yes Penile pain?: No  Gastrointestinal Nausea?: No Vomiting?: No Indigestion/heartburn?: No Diarrhea?: No Constipation?: No  Constitutional Fever: No Night sweats?: No Weight loss?: No Fatigue?: No  Skin Skin rash/lesions?: No Itching?: No  Eyes Blurred vision?: No Double vision?: No  Ears/Nose/Throat Sore throat?: No Sinus problems?: No  Hematologic/Lymphatic Swollen glands?: No Easy bruising?: No  Cardiovascular Leg swelling?: No Chest pain?: No  Respiratory Cough?: No Shortness of breath?: No  Endocrine Excessive thirst?: No  Musculoskeletal Back pain?: No Joint pain?: No  Neurological Headaches?: No Dizziness?: No  Psychologic Depression?: No Anxiety?: No  Physical Exam: BP 124/87 mmHg  Pulse 65  Ht 6\' 3"  (1.905 m)  Wt 195 lb 9.6 oz (88.724 kg)  BMI 24.45 kg/m2  Constitutional:  Alert and oriented, No acute distress. HEENT: [place] AT, moist mucus membranes.  Trachea midline, no masses. Cardiovascular: No clubbing, cyanosis, or edema. Respiratory: Normal respiratory effort, no increased work of breathing. GI: Abdomen is soft, nontender, nondistended, no abdominal masses GU: No CVA tenderness.  Skin: No rashes, bruises or suspicious lesions. Lymph: No cervical or inguinal adenopathy. Neurologic: Grossly intact, no focal deficits, moving all 4 extremities. Psychiatric: Normal mood and affect.  Laboratory Data: Lab Results  Component Value Date   HGB 15.9 06/28/2012    Lab Results  Component Value Date   CREATININE 1.05 01/25/2015    Lab Results   Component Value Date   PSA 4.0 03/25/2013    No results found for: TESTOSTERONE  No results found for: HGBA1C  Urinalysis    Component Value Date/Time   GLUCOSEU Negative 01/25/2015 1536   BILIRUBINUR Negative 01/25/2015 1536   BILIRUBINUR Neg 01/17/2015 1410   PROTEINUR Neg 01/17/2015 1410   UROBILINOGEN 0.2 01/17/2015 1410   NITRITE Negative 01/25/2015 1536   NITRITE Neg 01/17/2015 1410   LEUKOCYTESUR Negative 01/25/2015 1536   LEUKOCYTESUR Negative 01/17/2015 1410    Pertinent Imaging: CLINICAL DATA: Frequent urination. Gross hematuria.  EXAM: CT ABDOMEN AND PELVIS WITHOUT AND WITH CONTRAST  TECHNIQUE: Multidetector CT imaging of the abdomen and pelvis was performed following the standard protocol before and following the bolus administration of intravenous contrast.  CONTRAST: 138mL OMNIPAQUE IOHEXOL 300 MG/ML SOLN  COMPARISON: None.  FINDINGS: Lower chest: Noncalcified 0.6 by 0.5 cm left lower lobe pulmonary nodule, image 8 series 8.  Hepatobiliary: Approximately 10 hypodense lesions are present scattered in the liver. The  most of these are technically too small to characterize although statistically likely to be cysts given their persistence as hypodense lesions on delayed images. The however, the largest of the lesions is in the lateral segment left hepatic lobe near the falciform ligament and measures approximately 2.0 by 1.4 cm, with precontrast hypodensity, portal venous phase hypodensity, and delayed phase slight hyper density relative to hepatic parenchyma. Gallbladder and CBD unremarkable.  Pancreas: Unremarkable  Spleen: Unremarkable  Adrenals/Urinary Tract: Bilateral hypodense renal lesions are present. The largest of these is a 3.2 by 2.9 cm simple appearing cyst in the left kidney upper pole. However, posterior to this there is a 1.5 by 1.3 cm precontrast hyperdense lesion with precontrast density 40 Hounsfield units, portal  venous phase density 42 Hounsfield units, and delayed phase density 43 Hounsfield units, compatible with a Bosniak category 2 cyst (complex but benign). Some of the renal lesions are technically too small to characterize.  No urinary tract calculus identified. No abnormal enhancement or abnormal filling defect is identified along the urothelium.  Stomach/Bowel: Unremarkable  Vascular/Lymphatic: Mild aortoiliac atherosclerotic vascular calcification. No adenopathy identified.  Reproductive: Abnormal prominence of the prostate gland, 6.0 by 5.2 by 8.0 cm (volume 130 cc) with prominent seminal vesicles particularly on the right, and median lobe indenting the bladder base.  Other: No supplemental non-categorized findings.  Musculoskeletal: Mild straightening of the normal lumbar lordosis likely incidental. Nonspecific sclerotic 9 mm lesion in the right L2 vertebral body anteriorly. Small umbilical hernia contains adipose tissue. Small direct bilateral inguinal hernias contain adipose tissue.  IMPRESSION: 1. Prominently enlarged prostate gland approximately 130 cc. Slightly asymmetric prominence of the seminal vesicles. Correlate with PSA levels. 2. There is a small sclerotic lesion anteriorly in the L2 vertebral body. Although statistically likely to be a benign, this lesion is technically nonspecific. 3. Scattered renal cysts are present. Most of these are simple. There is a Bosniak category 2 benign complex cyst of the left kidney upper pole. Multiple hypodense lesions in the kidneys are technically too small to characterize. 4. Similarly there are hepatic cysts, and some small hepatic lesions too small to characterize. There is also a 2.0 by 1.4 cm left hepatic lobe hypodense lesion with delayed enhancement which is statistically most likely to be a hemangioma, although not entirely technically specific. If clinically warranted (for example, if the patient has abnormal  liver enzymes, history of gastrointestinal malignancy, or other risk factors), hepatic protocol MRI with and without contrast could be utilized to further characterize this lesion. 5. Noncalcified 6 mm left lower lobe pulmonary nodule. If the patient is at high risk for bronchogenic carcinoma, follow-up chest CT at 6-12 months is recommended. If the patient is at low risk for bronchogenic carcinoma, follow-up chest CT at 12 months is recommended. This recommendation follows the consensus statement: Guidelines for Management of Small Pulmonary Nodules Detected on CT Scans: A Statement from the [employer] as published in Radiology 2005;237:395-400. 6. Small bilateral direct inguinal hernias containing adipose tissue.    Cystoscopy Procedure Note  Patient identification was confirmed, informed consent was obtained, and patient was prepped using Betadine solution.  Lidocaine jelly was administered per urethral meatus.    Preoperative abx where received prior to procedure.     Pre-Procedure: - Inspection reveals a normal caliber ureteral meatus.  Procedure: The flexible cystoscope was introduced without difficulty - No urethral strictures/lesions are present. - Enlarged prostate - 6 cm in length. Visually obstructive - Normal bladder neck - Bilateral ureteral orifices  identified - Bladder mucosa  reveals no ulcers, tumors, or lesions - No bladder stones - No trabeculation  Retroflexion shows intravesical lobe of prostate   Post-Procedure: - Patient tolerated the procedure well    Assessment & Plan:    I had a very long discussion with the patient regarding his multiple urological problems. First off, his PSA remains elevated. This could be due his 130 g prostate, however prostate cancer cannot be ruled out. I discussed in great detail undergoing a prostate biopsy with the patient. He understands the risks, benefits, and indications of this procedure. He understands  the risks include but are not limited to bleeding and infection. He is agreeable to proceed with this procedure.  Regards to his significant urinary symptoms, this is secondary to his 130 g prostate. He is only noted slight improvement on Rapaflo and finasteride but he has only been on it for 3 weeks. I discussed the patient that we should give finasteride more time to have its full effect. He is aware that he may need surgical correction at some point if he fails dual medical therapy. He is aware this will likely be a open simple prostatectomy due to the size of his gland. Next  At this time, the patient is not having intercourse due to pain from bulging of his bilateral direct hernias that were seen on CT scan. He is interested in referral to general surgery for evaluation and possible correction.  I also discussed with the patient the many incidental findings on his CT scan as documented below.  1. Asymptomatic microscopic hematuria -Negative hematuria work up. Follow up urinalysis in one year.  2. BPH with lower urinary tract symptoms (130 gm prostate) -Rapaflo 8 mg daily -Finasteride 5 mg daily  -He just started his medications a few weeks ago. If he does not improve within a few months, he may need a simple prostatectomy.  3. Erectile dysfunction Continue Cialis prn  4. Elevated PSA -schedule prostate biopsy in 1-2 weeks  5. Peyronie's disease. Stable status post plication.  6. Left upper pole Bosniak 2 benign cyst Benign lesion. No further workup necessary.  7. Left lower lobe pulmonary nodule Per the radiologist report, he will need a repeat CT scan in 6-12 months. He was given a copy of the CT scan report and instructed to discuss it with his primary care provider for follow-up imaging.  8. 2 cm left hepatic lobe hypodense lesion He may need hepatic protocol MRI depending on his risk factors. He was given a copy of his radiology report instructed to follow up with his primary  care provider to discuss it further imaging is wanted.  9. Symptomatic direct inguinal hernia bilaterally Referral to general surgery   Return in about 2 weeks (around 03/01/2015) for prostate biopsy.  Nickie Retort, MD  [facility] [address], [place], [place] [address] [phone]

## 2015-02-15 NOTE — Addendum Note (Signed)
Addended by: Wilson Singer on: 02/15/2015 09:46 AM   Modules accepted: Orders

## 2015-02-19 ENCOUNTER — Ambulatory Visit: Payer: BC Managed Care – PPO

## 2015-02-19 DIAGNOSIS — M542 Cervicalgia: Secondary | ICD-10-CM

## 2015-02-19 DIAGNOSIS — M5441 Lumbago with sciatica, right side: Secondary | ICD-10-CM | POA: Diagnosis not present

## 2015-02-19 NOTE — Therapy (Signed)
[place] MAIN [facility] SERVICES [address] [place], [place], [address] Phone: [phone]   Fax:  [phone]  Physical Therapy Treatment  Patient Details  Name: Micheal Mccoy MRN: [id number] Date of Birth: December 09, 1955 Referring Provider: D. Fisher  Encounter Date: 02/19/2015      PT End of Session - 02/19/15 0843    Visit Number 5   Number of Visits 9   Date for PT Re-Evaluation 03/01/15   PT Start Time 0807   PT Stop Time 0855   PT Time Calculation (min) 48 min   Activity Tolerance Patient tolerated treatment well   Behavior During Therapy [facility] for tasks assessed/performed      Past Medical History  Diagnosis Date  . Peyronie's disease     W/ PENILE CURVATURE  . Frequency of urination   . Urgency of urination   . GERD (gastroesophageal reflux disease)   . BPH (benign prostatic hypertrophy)   . Arthritis   . Degenerative disc disease, cervical   . Benign neoplasm of ascending colon   . Esophagitis, reflux 09/29/2005  . DDD (degenerative disc disease), lumbar 01/18/2015    Past Surgical History  Procedure Laterality Date  . Nesbit procedure N/A 06/28/2012    Procedure: 16 DOT PLACTATION PROCEDURE;  Surgeon: Claybon Jabs, MD;  Location: [facility];  Service: Urology;  Laterality: N/A;  . Peyronies disease  06/2012  . Colonoscopy  06/2004    Done by Dr. Bary Castilla. No polyps  . Colonoscopy with propofol N/A 09/14/2014    Procedure: COLONOSCOPY WITH PROPOFOL;  Surgeon: Lucilla Lame, MD;  Location: [place];  Service: Endoscopy;  Laterality: N/A;  . Polypectomy  09/14/2014    Procedure: POLYPECTOMY;  Surgeon: Lucilla Lame, MD;  Location: [place];  Service: Endoscopy;;    There were no vitals filed for this visit.  Visit Diagnosis:  Cervicalgia  Right-sided low back pain with right-sided sciatica      Subjective Assessment - 02/19/15 0809    Subjective pt reports overall his back and leg have  been feeling better, but "its hard to tell because Donnald Garre been sick".   Limitations Sitting   How long can you sit comfortably? depends on the chair, but sometimes up to 30 min   Diagnostic tests Xray DDD, no MRI   Currently in Pain? No/denies            Therex:L: Piriformis stretch 30s x 2 bilaterally Diaphragmatic breathing (initiating TA contraction) with alt knee extension in hooklying  Diaphragmatic breathing (initiating TA contraction) with alt hip IR/ER with red band 2x10 Diaphragmatic breathing (initiating TA contraction) with bridge- band around knees Diaphragmatic breathing (initiating TA contraction) prone swimmers 2x10 Standing hip flexion/abduction/extension Diaphragmatic breathing (initiating TA contraction) 2x10 with red band Pt requires min verbal and tactile cues for proper exercise performance   Estim: Hi-Volt Estim to bilateral lumbar paraspinals. 120 and 105A x 10 min in prone to reduce muscle spasm                     PT Education - 02/19/15 0843    Education provided Yes   Education Details plan of care, progression of HEP   Person(s) Educated Patient   Methods Explanation   Comprehension Verbalized understanding             PT Long Term Goals - 01/29/15 1008    PT LONG TERM GOAL #1   Title pt will be  independent and compliant with HEP   Time 4   Period Weeks   Status New   PT LONG TERM GOAL #2   Title pt will report no more than 2/10 LBP/ leg pain for 1 or more weeks   Time 4   Period Weeks   Status New   PT LONG TERM GOAL #3   Title pt will be able to sit with proper posture and lumbar support x 1 hr without pain   Time 4   Period Weeks   Status New   PT LONG TERM GOAL #4   Title pt will improve cervical ROM rotation by 10 deg bilaterally to aid with driving    Time 4   Period Weeks   Status New               Plan - 02/19/15 [id number]    Clinical Impression Statement despite taking a week off of his exercises due  to illness, pt reports his back pain and leg pain have not been too bad. he reports however he will be undergoing further medical assessment for his leg pain via neurology. progressed strengthening today without increased pain.    Pt will benefit from skilled therapeutic intervention in order to improve on the following deficits Decreased range of motion;Decreased mobility;Decreased strength;Difficulty walking;Hypomobility;Pain;Impaired flexibility;Increased muscle spasms;Postural dysfunction;Improper body mechanics   Rehab Potential Good   Clinical Impairments Affecting Rehab Potential 10+ years of pain, history of DDD / positive: regular physical activity   PT Frequency 2x / week   PT Duration 4 weeks   PT Treatment/Interventions ADLs/Self Care Home Management;Aquatic Therapy;Cryotherapy;Electrical Stimulation;Moist Heat;Traction;Ultrasound;Patient/family education;Neuromuscular re-education;Therapeutic exercise;Gait training;Manual techniques;Passive range of motion;Dry needling;Taping   PT Next Visit Plan progress strengthening as tolerated         Problem List Patient Active Problem List   Diagnosis Date Noted  . DDD (degenerative disc disease), lumbar 01/18/2015  . Low back pain 01/18/2015  . Urinary frequency 01/17/2015  . Neck pain, chronic 01/17/2015  . Benign neoplasm of ascending colon   . Arthritis 08/24/2014  . BPH (benign prostatic hypertrophy) 08/24/2014  . Penile mass 08/24/2014  . Erectile dysfunction 08/24/2014  . Fecal occult blood test positive 08/24/2014  . Peyronie's disease 06/28/2012  . Cervicalgia 07/12/2007  . Lumbago 07/12/2007  . Dizziness and giddiness 08/18/2006  . Esophagitis, reflux 09/29/2005  . Cervical spondylosis without myelopathy 01/06/1998   Caryl Pina C. Adaley Kiene, PT, DPT [phone]   Chick Cousins 02/19/2015, 8:45 AM  [place] MAIN [facility] SERVICES [address] [place], [place], [address] Phone:  [phone]   Fax:  [phone]  Name: Micheal Mccoy MRN: [id number] Date of Birth: 1955-05-21

## 2015-02-21 ENCOUNTER — Ambulatory Visit: Payer: BC Managed Care – PPO

## 2015-02-21 ENCOUNTER — Encounter: Payer: Self-pay | Admitting: General Surgery

## 2015-02-21 DIAGNOSIS — M5441 Lumbago with sciatica, right side: Secondary | ICD-10-CM | POA: Diagnosis not present

## 2015-02-21 DIAGNOSIS — M542 Cervicalgia: Secondary | ICD-10-CM

## 2015-02-21 NOTE — Patient Instructions (Signed)
[email or web address] Chin tucks x10 3x/day

## 2015-02-21 NOTE — Therapy (Signed)
[place] MAIN [facility] SERVICES [address] [place], [place], [address] Phone: [phone]   Fax:  [phone]  Physical Therapy Treatment  Patient Details  Name: Micheal Mccoy MRN: [id number] Date of Birth: 1955-06-23 Referring Provider: D. Fisher  Encounter Date: 02/21/2015      PT End of Session - 02/21/15 1025    Visit Number 6   Number of Visits 9   Date for PT Re-Evaluation 03/01/15   PT Start Time 0810   PT Stop Time 0905   PT Time Calculation (min) 55 min   Activity Tolerance Patient tolerated treatment well   Behavior During Therapy [facility] for tasks assessed/performed      Past Medical History  Diagnosis Date  . Peyronie's disease     W/ PENILE CURVATURE  . Frequency of urination   . Urgency of urination   . GERD (gastroesophageal reflux disease)   . BPH (benign prostatic hypertrophy)   . Arthritis   . Degenerative disc disease, cervical   . Benign neoplasm of ascending colon   . Esophagitis, reflux 09/29/2005  . DDD (degenerative disc disease), lumbar 01/18/2015    Past Surgical History  Procedure Laterality Date  . Nesbit procedure N/A 06/28/2012    Procedure: 16 DOT PLACTATION PROCEDURE;  Surgeon: Claybon Jabs, MD;  Location: [facility];  Service: Urology;  Laterality: N/A;  . Peyronies disease  06/2012  . Colonoscopy  06/2004    Done by Dr. Bary Castilla. No polyps  . Colonoscopy with propofol N/A 09/14/2014    Procedure: COLONOSCOPY WITH PROPOFOL;  Surgeon: Lucilla Lame, MD;  Location: [place];  Service: Endoscopy;  Laterality: N/A;  . Polypectomy  09/14/2014    Procedure: POLYPECTOMY;  Surgeon: Lucilla Lame, MD;  Location: [place];  Service: Endoscopy;;    There were no vitals filed for this visit.  Visit Diagnosis:  Cervicalgia  Right-sided low back pain with right-sided sciatica      Subjective Assessment - 02/21/15 1023    Subjective pt reports he has been having less  instances of his back tightening up. pt reports his neck pain has been more aggrevating lately.    Limitations Sitting   How long can you sit comfortably? depends on the chair, but sometimes up to 30 min   Diagnostic tests Xray DDD, no MRI   Currently in Pain? Yes   Pain Score 3    Pain Location --  neck   Pain Orientation Posterior      Therex:   Piriformis stretch 30s x 2 bilaterally Diaphragmatic breathing (initiating TA contraction) with alt knee extension in hooklying  Diaphragmatic breathing (initiating TA contraction) with alt hip IR/ER with red band 2x10 Diaphragmatic breathing (initiating TA contraction) with bridge- band around knees Standing hip flexion/abduction/extension Diaphragmatic breathing (initiating TA contraction) 2x10 with red band Matrix: D2 shoulder extension X pattern 7.5lbs 2x10 Pavloff press: blue band 2x10 each side Prone horiz shoulder abd with ER x 10 (pt found painful) Wall slide with Y and lift off (lower trap activation) 2x10 pt need mod verbal and tactile cues  Chin tuck 5s hold 2x10 Pt requires min verbal and tactile cues for proper exercise performance   Estim: Hi-Volt Estim to bilateral lumbar paraspinals. 90A and 50A x 10 min in prone to reduce muscle spasm                          PT Education -  02/21/15 1025    Education provided Yes   Education Details periscapular strengthening    Person(s) Educated Patient   Methods Explanation   Comprehension Verbalized understanding;Returned demonstration             PT Long Term Goals - 01/29/15 1008    PT LONG TERM GOAL #1   Title pt will be independent and compliant with HEP   Time 4   Period Weeks   Status New   PT LONG TERM GOAL #2   Title pt will report no more than 2/10 LBP/ leg pain for 1 or more weeks   Time 4   Period Weeks   Status New   PT LONG TERM GOAL #3   Title pt will be able to sit with proper posture and lumbar support x 1 hr without pain    Time 4   Period Weeks   Status New   PT LONG TERM GOAL #4   Title pt will improve cervical ROM rotation by 10 deg bilaterally to aid with driving    Time 4   Period Weeks   Status New               Plan - 02/21/15 1026    Clinical Impression Statement progressed persicapular strengthening today to reduce upper trap dominance, improve posture and help with neck pain. pt found difficulty with mid/lower trap exercises. pt also preformed cervical retraction which he found to reduce his neck pain.    Pt will benefit from skilled therapeutic intervention in order to improve on the following deficits Decreased range of motion;Decreased mobility;Decreased strength;Difficulty walking;Hypomobility;Pain;Impaired flexibility;Increased muscle spasms;Postural dysfunction;Improper body mechanics   Rehab Potential Good   Clinical Impairments Affecting Rehab Potential 10+ years of pain, history of DDD / positive: regular physical activity   PT Frequency 2x / week   PT Duration 4 weeks   PT Treatment/Interventions ADLs/Self Care Home Management;Aquatic Therapy;Cryotherapy;Electrical Stimulation;Moist Heat;Traction;Ultrasound;Patient/family education;Neuromuscular re-education;Therapeutic exercise;Gait training;Manual techniques;Passive range of motion;Dry needling;Taping   PT Next Visit Plan progress strengthening as tolerated         Problem List Patient Active Problem List   Diagnosis Date Noted  . DDD (degenerative disc disease), lumbar 01/18/2015  . Low back pain 01/18/2015  . Urinary frequency 01/17/2015  . Neck pain, chronic 01/17/2015  . Benign neoplasm of ascending colon   . Arthritis 08/24/2014  . BPH (benign prostatic hypertrophy) 08/24/2014  . Penile mass 08/24/2014  . Erectile dysfunction 08/24/2014  . Fecal occult blood test positive 08/24/2014  . Peyronie's disease 06/28/2012  . Cervicalgia 07/12/2007  . Lumbago 07/12/2007  . Dizziness and giddiness 08/18/2006  .  Esophagitis, reflux 09/29/2005  . Cervical spondylosis without myelopathy 01/06/1998   Caryl Pina C. Conny Situ, PT, DPT [phone]  Tyronda Vizcarrondo 02/21/2015, 10:30 AM  [place] MAIN [facility] SERVICES [address] [place], [place], [address] Phone: [phone]   Fax:  [phone]  Name: Micheal Mccoy MRN: [id number] Date of Birth: [birth year]

## 2015-02-22 ENCOUNTER — Ambulatory Visit (INDEPENDENT_AMBULATORY_CARE_PROVIDER_SITE_OTHER): Payer: BC Managed Care – PPO | Admitting: General Surgery

## 2015-02-22 ENCOUNTER — Encounter: Payer: Self-pay | Admitting: General Surgery

## 2015-02-22 VITALS — BP 124/70 | HR 68 | Resp 12 | Ht 75.0 in | Wt 193.0 lb

## 2015-02-22 DIAGNOSIS — N400 Enlarged prostate without lower urinary tract symptoms: Secondary | ICD-10-CM | POA: Diagnosis not present

## 2015-02-22 DIAGNOSIS — K4090 Unilateral inguinal hernia, without obstruction or gangrene, not specified as recurrent: Secondary | ICD-10-CM

## 2015-02-22 DIAGNOSIS — K429 Umbilical hernia without obstruction or gangrene: Secondary | ICD-10-CM | POA: Diagnosis not present

## 2015-02-22 NOTE — Patient Instructions (Signed)

## 2015-02-22 NOTE — Progress Notes (Signed)
Patient ID: Micheal Mccoy, male   DOB: 1955-08-28, 60 y.o.   MRN: [id number]  Chief Complaint  Patient presents with  . Other    hernia    HPI Micheal Mccoy is a 60 y.o. male here today for evaluation of bilateral inguinal hernia. Patient states he noticed these area about 6 months. Pain with activity . The areas pop in and out and he has noticed some swelling. Had a ct scan on 02/02/15.  HPI  Past Medical History  Diagnosis Date  . Peyronie's disease     W/ PENILE CURVATURE  . Frequency of urination   . Urgency of urination   . GERD (gastroesophageal reflux disease)   . BPH (benign prostatic hypertrophy)   . Arthritis   . Degenerative disc disease, cervical   . Benign neoplasm of ascending colon   . Esophagitis, reflux 09/29/2005  . DDD (degenerative disc disease), lumbar 01/18/2015    Past Surgical History  Procedure Laterality Date  . Nesbit procedure N/A 06/28/2012    Procedure: 16 DOT PLACTATION PROCEDURE;  Surgeon: Claybon Jabs, MD;  Location: [facility];  Service: Urology;  Laterality: N/A;  . Peyronies disease  06/2012  . Colonoscopy  06/2004    Done by Dr. Bary Castilla. No polyps  . Colonoscopy with propofol N/A 09/14/2014    Procedure: COLONOSCOPY WITH PROPOFOL;  Surgeon: Lucilla Lame, MD;  Location: [place];  Service: Endoscopy;  Laterality: N/A;  . Polypectomy  09/14/2014    Procedure: POLYPECTOMY;  Surgeon: Lucilla Lame, MD;  Location: [place];  Service: Endoscopy;;    Family History  Problem Relation Age of Onset  . Prostate cancer Neg Hx   . Bladder Cancer Neg Hx   . Kidney cancer Neg Hx     Social History Social History  Substance Use Topics  . Smoking status: Never Smoker   . Smokeless tobacco: Never Used  . Alcohol Use: No    No Known Allergies  Current Outpatient Prescriptions  Medication Sig Dispense Refill  . CIALIS 5 MG tablet TAKE 1 TABLET BY MOUTH EVERY DAY FOR BPH 30 tablet 12  . finasteride (PROSCAR) 5 MG  tablet Take 1 tablet (5 mg total) by mouth daily. 30 tablet 11  . silodosin (RAPAFLO) 8 MG CAPS capsule Take 1 capsule (8 mg total) by mouth daily with breakfast. 30 capsule 11   No current facility-administered medications for this visit.    Review of Systems Review of Systems  Constitutional: Negative.   Respiratory: Negative.   Cardiovascular: Negative.     Blood pressure 124/70, pulse 68, resp. rate 12, height 6\' 3"  (1.905 m), weight 193 lb (87.544 kg).  Physical Exam Physical Exam  Constitutional: He is oriented to person, place, and time. He appears well-developed and well-nourished.  Eyes: Conjunctivae are normal. No scleral icterus.  Neck: Neck supple.  Cardiovascular: Normal rate, regular rhythm and normal heart sounds.   Pulmonary/Chest: Effort normal and breath sounds normal.  Abdominal: Soft. Normal appearance and bowel sounds are normal. There is no tenderness. A hernia ( small umbilical ) is present. Hernia confirmed positive in the Mccoy inguinal area and confirmed positive in the left inguinal area.  Lymphadenopathy:    He has no cervical adenopathy.  Neurological: He is alert and oriented to person, place, and time.  Skin: Skin is warm and dry.    Data Reviewed 02/02/2015 CT scan of the abdomen and pelvis obtained for evaluation of his prostate was reviewed.  A markedly enlarged gland is noted, 130 g estimated weight. Bilateral direct hernias evident. 1 cm fascial defect at the umbilicus.  Assessment    Modestly symptomatic bilateral inguinal hernias, asymptomatic umbilical hernia.  Profound prostatic enlargement, markedly symptomatic, pending biopsy.    Plan    I would defer surgical intervention on the hernias until his prostate evaluation is complete. In reading the urology note there is discussion the possibility of open prostatectomy. This needs to be considered of prosthetic mesh is going to be used for elective hernia repair.  Patient has minimal  elevation of his PSA, but the prostate is impressive on CT imaging.  With the bilateral inguinal hernias as well as an umbilical hernia, he would likely be best served by a laparoscopic repair. We'll review this in more detail after the urologic situation is clarified.  The patient lifts weights as part of his general health program, and his main need to be reassessed with the identification of multiple abdominal wall hernias.  The role for prosthetic mesh material use was discussed.    Hernia precautions and incarceration were discussed with the patient. If they develop symptoms of an incarcerated hernia, they were encouraged to seek prompt medical attention.   PCP:  Caryn Section,  Ref. Nickie Retort, MD  This information has been scribed by Gaspar Cola CMA.    Robert Bellow 02/23/2015, 9:56 AM

## 2015-02-23 DIAGNOSIS — K429 Umbilical hernia without obstruction or gangrene: Secondary | ICD-10-CM | POA: Insufficient documentation

## 2015-02-23 DIAGNOSIS — K4090 Unilateral inguinal hernia, without obstruction or gangrene, not specified as recurrent: Secondary | ICD-10-CM | POA: Insufficient documentation

## 2015-02-23 DIAGNOSIS — N400 Enlarged prostate without lower urinary tract symptoms: Secondary | ICD-10-CM | POA: Insufficient documentation

## 2015-02-26 ENCOUNTER — Ambulatory Visit: Payer: BC Managed Care – PPO

## 2015-03-02 ENCOUNTER — Ambulatory Visit (INDEPENDENT_AMBULATORY_CARE_PROVIDER_SITE_OTHER): Payer: BC Managed Care – PPO | Admitting: Urology

## 2015-03-02 ENCOUNTER — Other Ambulatory Visit: Payer: Self-pay | Admitting: Urology

## 2015-03-02 ENCOUNTER — Encounter: Payer: Self-pay | Admitting: Urology

## 2015-03-02 VITALS — BP 144/87 | HR 71 | Ht 75.0 in | Wt 195.0 lb

## 2015-03-02 DIAGNOSIS — R972 Elevated prostate specific antigen [PSA]: Secondary | ICD-10-CM

## 2015-03-02 HISTORY — PX: PROSTATE BIOPSY: SHX241

## 2015-03-02 MED ORDER — GENTAMICIN SULFATE 40 MG/ML IJ SOLN
80.0000 mg | Freq: Once | INTRAMUSCULAR | Status: AC
  Administered 2015-03-02: 80 mg via INTRAMUSCULAR

## 2015-03-02 MED ORDER — LEVOFLOXACIN 500 MG PO TABS
500.0000 mg | ORAL_TABLET | Freq: Once | ORAL | Status: AC
  Administered 2015-03-02: 500 mg via ORAL

## 2015-03-02 NOTE — Progress Notes (Signed)
03/02/2015  HPI: 60 yo M with elevated PSA, BPH.  Biopsy discussed with Dr. Pilar Jarvis.  Prostate Biopsy Procedure   Informed consent was obtained after discussing risks/benefits of the procedure.  A time out was performed to ensure correct patient identity.  Pre-Procedure: - Last PSA Level:  Lab Results  Component Value Date   PSA 4.0 03/25/2013   - Gentamicin given prophylactically - Levaquin 500 mg administered PO -Transrectal Ultrasound performed revealing a 93 gm prostate -No significant hypoechoic noted -Intravesical protrusion into bladder noted   Procedure: - Prostate block performed using 10 cc 1% lidocaine and biopsies taken from sextant areas, a total of 12 under ultrasound guidance.  Post-Procedure: - Patient tolerated the procedure well - He was counseled to seek immediate medical attention if experiences any severe pain, significant bleeding, or fevers - Return in one week to discuss biopsy results  Hollice Espy, MD

## 2015-03-05 ENCOUNTER — Ambulatory Visit: Payer: BC Managed Care – PPO

## 2015-03-05 DIAGNOSIS — M5441 Lumbago with sciatica, right side: Secondary | ICD-10-CM

## 2015-03-05 DIAGNOSIS — M542 Cervicalgia: Secondary | ICD-10-CM

## 2015-03-05 NOTE — Therapy (Signed)
[place] MAIN [facility] SERVICES [address] [place], [place], [address] Phone: [phone]   Fax:  [phone]  Physical Therapy Treatment / Discharge note  Patient Details  Name: Micheal Mccoy MRN: [id number] Date of Birth: [birth year] Referring Provider: D. Fisher  Encounter Date: 03/05/2015      PT End of Session - 03/05/15 1021    Visit Number 7   Number of Visits 9   Date for PT Re-Evaluation 03/01/15   PT Start Time 0810   PT Stop Time 0850   PT Time Calculation (min) 40 min   Activity Tolerance Patient tolerated treatment well   Behavior During Therapy [facility] for tasks assessed/performed      Past Medical History  Diagnosis Date  . Peyronie's disease     W/ PENILE CURVATURE  . Frequency of urination   . Urgency of urination   . GERD (gastroesophageal reflux disease)   . BPH (benign prostatic hypertrophy)   . Arthritis   . Degenerative disc disease, cervical   . Benign neoplasm of ascending colon   . Esophagitis, reflux 09/29/2005  . DDD (degenerative disc disease), lumbar 01/18/2015    Past Surgical History  Procedure Laterality Date  . Nesbit procedure N/A 06/28/2012    Procedure: 16 DOT PLACTATION PROCEDURE;  Surgeon: Claybon Jabs, MD;  Location: [facility];  Service: Urology;  Laterality: N/A;  . Peyronies disease  06/2012  . Colonoscopy  06/2004    Done by Dr. Bary Castilla. No polyps  . Colonoscopy with propofol N/A 09/14/2014    Procedure: COLONOSCOPY WITH PROPOFOL;  Surgeon: Lucilla Lame, MD;  Location: [place];  Service: Endoscopy;  Laterality: N/A;  . Polypectomy  09/14/2014    Procedure: POLYPECTOMY;  Surgeon: Lucilla Lame, MD;  Location: [place];  Service: Endoscopy;;    There were no vitals filed for this visit.  Visit Diagnosis:  Cervicalgia  Right-sided low back pain with right-sided sciatica      Subjective Assessment - 03/05/15 1020    Subjective pt reports he is having  much less lower back and neck pain. he is happy with his progress and feels ready for DC. he is having a hernia repair soon also.    Limitations Sitting   How long can you sit comfortably? depends on the chair, but sometimes up to 30 min   Diagnostic tests Xray DDD, no MRI   Currently in Pain? No/denies         Therex:   Diaphragmatic breathing (initiating TA contraction) with alt knee extension in hooklying  Diaphragmatic breathing (initiating TA contraction) with alt hip IR/ER with red band 2x10 Diaphragmatic breathing (initiating TA contraction) with bridge- band around knees Standing hip flexion/abduction/extension Diaphragmatic breathing (initiating TA contraction) 2x10 with red band Matrix: D2 shoulder extension X pattern 7.5lbs 2x10 Pavloff press: blue band 2x10 each side Wall slide with Y and lift off (lower trap activation) 2x10 pt need mod verbal and tactile cues  Chin tuck 5s hold x10 Pt requires min verbal and tactile cues for proper exercise performance   Estim: Hi-Volt Estim to bilateral lumbar paraspinals. 90A and 120A x 8 min in prone to reduce muscle spasm                                PT Education - 03/05/15 1021    Education provided Yes   Education Details progressed HEP  Person(s) Educated Patient   Methods Explanation   Comprehension Verbalized understanding             PT Long Term Goals - 03/05/15 1022    PT LONG TERM GOAL #1   Title pt will be independent and compliant with HEP   Time 4   Period Weeks   Status Achieved   PT LONG TERM GOAL #2   Title pt will report no more than 2/10 LBP/ leg pain for 1 or more weeks   Time 4   Period Weeks   Status Achieved   PT LONG TERM GOAL #3   Title pt will be able to sit with proper posture and lumbar support x 1 hr without pain   Time 4   Period Weeks   Status Achieved   PT LONG TERM GOAL #4   Title pt will improve cervical ROM rotation by 10 deg bilaterally to aid  with driving    Time 4   Period Weeks   Status Deferred               Plan - 03/05/15 1022    Clinical Impression Statement pt has achieved PT goals and will be DC to independent HEP . He has made signficant progress regarding his pain and activity tolerance.    Pt will benefit from skilled therapeutic intervention in order to improve on the following deficits Decreased range of motion;Decreased mobility;Decreased strength;Difficulty walking;Hypomobility;Pain;Impaired flexibility;Increased muscle spasms;Postural dysfunction;Improper body mechanics   Rehab Potential Good   Clinical Impairments Affecting Rehab Potential 10+ years of pain, history of DDD / positive: regular physical activity   PT Frequency 2x / week   PT Duration 4 weeks   PT Treatment/Interventions ADLs/Self Care Home Management;Aquatic Therapy;Cryotherapy;Electrical Stimulation;Moist Heat;Traction;Ultrasound;Patient/family education;Neuromuscular re-education;Therapeutic exercise;Gait training;Manual techniques;Passive range of motion;Dry needling;Taping   PT Next Visit Plan progress strengthening as tolerated         Problem List Patient Active Problem List   Diagnosis Date Noted  . Umbilical hernia without obstruction and without gangrene 02/23/2015  . Left inguinal hernia 02/23/2015  . Right inguinal hernia 02/23/2015  . Prostate enlargement 02/23/2015  . DDD (degenerative disc disease), lumbar 01/18/2015  . Low back pain 01/18/2015  . Urinary frequency 01/17/2015  . Neck pain, chronic 01/17/2015  . Benign neoplasm of ascending colon   . Arthritis 08/24/2014  . BPH (benign prostatic hypertrophy) 08/24/2014  . Penile mass 08/24/2014  . Erectile dysfunction 08/24/2014  . Fecal occult blood test positive 08/24/2014  . Peyronie's disease 06/28/2012  . Cervicalgia 07/12/2007  . Lumbago 07/12/2007  . Dizziness and giddiness 08/18/2006  . Esophagitis, reflux 09/29/2005  . Cervical spondylosis without  myelopathy 01/06/1998   Caryl Pina C. Zamere Pasternak, PT, DPT [phone]  Anjuli Gemmill 03/05/2015, 10:23 AM  [place] MAIN [facility] SERVICES [address] [place], [place], [address] Phone: [phone]   Fax:  [phone]  Name: Micheal Mccoy MRN: [id number] Date of Birth: 08-20-1955

## 2015-03-05 NOTE — Patient Instructions (Signed)
[email or web address] Diaphragmatic breathing (initiating TA contraction) with alt knee extension in hooklying  Diaphragmatic breathing (initiating TA contraction) with alt hip IR/ER with red band 2x10 Diaphragmatic breathing (initiating TA contraction) with bridge- band around knees Standing hip flexion/abduction/extension Diaphragmatic breathing (initiating TA contraction) 2x10 with red band Matrix: D2 shoulder extension X pattern 7.5lbs 2x10 Pavloff press: blue band 2x10 each side Prone horiz shoulder abd with ER x 10 (pt found painful) Wall slide with Y and lift off (lower trap activation) 2x10 pt need mod verbal and tactile cues  Chin tuck 5s hold 2x10

## 2015-03-07 LAB — PATHOLOGY REPORT

## 2015-03-08 ENCOUNTER — Telehealth: Payer: Self-pay

## 2015-03-08 NOTE — Telephone Encounter (Signed)
Per Dr. Erlene Quan patient was notified that prostate biopsy results came back negative. Patient follow up apt rescheduled for 2-85months to recheck on BPH and urinary symptoms. Patient verbalized agreement with this plan and was transferred to the front to have apt scheduled/SW

## 2015-03-13 ENCOUNTER — Other Ambulatory Visit: Payer: Self-pay | Admitting: Urology

## 2015-03-14 ENCOUNTER — Ambulatory Visit: Payer: BC Managed Care – PPO | Admitting: Urology

## 2015-03-22 ENCOUNTER — Ambulatory Visit: Payer: BC Managed Care – PPO

## 2015-04-04 ENCOUNTER — Encounter: Payer: Self-pay | Admitting: General Surgery

## 2015-04-04 ENCOUNTER — Ambulatory Visit (INDEPENDENT_AMBULATORY_CARE_PROVIDER_SITE_OTHER): Payer: BC Managed Care – PPO | Admitting: General Surgery

## 2015-04-04 VITALS — BP 120/78 | HR 82 | Resp 14 | Ht 75.0 in | Wt 194.0 lb

## 2015-04-04 DIAGNOSIS — K429 Umbilical hernia without obstruction or gangrene: Secondary | ICD-10-CM | POA: Diagnosis not present

## 2015-04-04 DIAGNOSIS — K4090 Unilateral inguinal hernia, without obstruction or gangrene, not specified as recurrent: Secondary | ICD-10-CM

## 2015-04-04 NOTE — H&P (Signed)
HPI  Micheal Mccoy is a 60 y.o. male. Here for re-evaluation of his hernias. The prostate biopsy was negative. He is using aleve for some mild discomfort from the hernias.  The patient's prostate biopsies were all negative for malignancy. He reports better elimination of his urine with his present regimen of Proscar and Rapaflo. Nocturia times 1 at night. Good bladder emptying by report.  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  HPI  Past Medical History   Diagnosis  Date   .  Peyronie's disease      W/ PENILE CURVATURE   .  Frequency of urination    .  Urgency of urination    .  GERD (gastroesophageal reflux disease)    .  BPH (benign prostatic hypertrophy)    .  Arthritis    .  Degenerative disc disease, cervical    .  Benign neoplasm of ascending colon    .  Esophagitis, reflux  09/29/2005   .  DDD (degenerative disc disease), lumbar  01/18/2015    Past Surgical History   Procedure  Laterality  Date   .  Nesbit procedure  N/A  06/28/2012     Procedure: 16 DOT PLACTATION PROCEDURE; Surgeon: Claybon Jabs, MD; Location: [facility]; Service: Urology; Laterality: N/A;   .  Peyronies disease   06/2012   .  Colonoscopy   06/2004     Done by Dr. Bary Castilla. No polyps   .  Colonoscopy with propofol  N/A  09/14/2014     Procedure: COLONOSCOPY WITH PROPOFOL; Surgeon: Lucilla Lame, MD; Location: [place]; Service: Endoscopy; Laterality: N/A;   .  Polypectomy   09/14/2014     Procedure: POLYPECTOMY; Surgeon: Lucilla Lame, MD; Location: [place]; Service: Endoscopy;;   .  Prostate biopsy   03-02-15     neg    Family History   Problem  Relation  Age of Onset   .  Prostate cancer  Neg Hx    .  Bladder Cancer  Neg Hx    .  Kidney cancer  Neg Hx     Social History  Social History   Substance Use Topics   .  Smoking status:  Never Smoker   .  Smokeless tobacco:  Never Used   .  Alcohol Use:  No    No Known Allergies  Current Outpatient Prescriptions    Medication  Sig  Dispense  Refill   .  CIALIS 5 MG tablet  TAKE 1 TABLET BY MOUTH EVERY DAY FOR BPH  30 tablet  12   .  finasteride (PROSCAR) 5 MG tablet  Take 1 tablet (5 mg total) by mouth daily.  30 tablet  11   .  Naproxen Sodium (ALEVE PO)  Take by mouth as needed.     .  silodosin (RAPAFLO) 8 MG CAPS capsule  Take 1 capsule (8 mg total) by mouth daily with breakfast.  30 capsule  11    No current facility-administered medications for this visit.    Review of Systems  Review of Systems  Constitutional: Negative.  Respiratory: Negative.  Cardiovascular: Negative.   Blood pressure 120/78, pulse 82, resp. rate 14, height 6\' 3"  (1.905 m), weight 194 lb (87.998 kg).  Physical Exam  Physical Exam  Constitutional: He is oriented to person, place, and time. He appears well-developed and well-nourished.  HENT:  Mouth/Throat: Oropharynx is clear and moist.  Eyes: Conjunctivae are  normal. No scleral icterus.  Neck: Neck supple.  Cardiovascular: Normal rate, regular rhythm and normal heart sounds.  Pulmonary/Chest: Effort normal and breath sounds normal.  Abdominal: Normal appearance. A hernia (Umbilical hernia with omentum.) is present. Hernia confirmed positive in the right inguinal area and confirmed positive in the left inguinal area.  Genitourinary: Testes normal.  Lymphadenopathy:  He has no cervical adenopathy.  Neurological: He is alert and oriented to person, place, and time.  Skin: Skin is warm and dry.  Psychiatric: His behavior is normal.   Data Reviewed  Prostate biopsies of earlier this month.  Assessment   Bilateral symptomatic inguinal hernias, umbilical hernia.   Plan    Hernia precautions and incarceration were discussed with the patient. If they develop symptoms of an incarcerated hernia, they were encouraged to seek prompt medical attention.  I have recommended repair of the hernia using mesh on an outpatient basis in the near future. The risk of infection was  reviewed. The role of prosthetic mesh to minimize the risk of recurrence was reviewed. Based on the presence of bilateral defects in the groin and think he be best served by a laparoscopic repair. The umbilical hernia would be repaired primarily during wound closure.  Patient's surgery has been scheduled for 04-09-15 at [facility].  PCP: Lelon Huh  This information has been scribed by Karie Fetch RN, BSN,BC.  Robert Bellow  04/04/2015, 5:42 PM

## 2015-04-04 NOTE — Progress Notes (Signed)
Patient ID: Micheal Mccoy, male   DOB: 07-Jan-1955, 60 y.o.   MRN: [id number]  Chief Complaint  Patient presents with  . Hernia    HPI Micheal Mccoy is a 60 y.o. male.  Here for re-evaluation of his hernias. The prostate biopsy was negative. He is using aleve for some mild discomfort from the hernias.  The patient's prostate biopsies were all negative for malignancy. He reports better elimination of his urine with his present regimen of Proscar and Rapaflo. Nocturia times 1 at night. Good bladder emptying by report.  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  HPI  Past Medical History  Diagnosis Date  . Peyronie's disease     W/ PENILE CURVATURE  . Frequency of urination   . Urgency of urination   . GERD (gastroesophageal reflux disease)   . BPH (benign prostatic hypertrophy)   . Arthritis   . Degenerative disc disease, cervical   . Benign neoplasm of ascending colon   . Esophagitis, reflux 09/29/2005  . DDD (degenerative disc disease), lumbar 01/18/2015    Past Surgical History  Procedure Laterality Date  . Nesbit procedure N/A 06/28/2012    Procedure: 16 DOT PLACTATION PROCEDURE;  Surgeon: Claybon Jabs, MD;  Location: [facility];  Service: Urology;  Laterality: N/A;  . Peyronies disease  06/2012  . Colonoscopy  06/2004    Done by Dr. Bary Castilla. No polyps  . Colonoscopy with propofol N/A 09/14/2014    Procedure: COLONOSCOPY WITH PROPOFOL;  Surgeon: Lucilla Lame, MD;  Location: [place];  Service: Endoscopy;  Laterality: N/A;  . Polypectomy  09/14/2014    Procedure: POLYPECTOMY;  Surgeon: Lucilla Lame, MD;  Location: [place];  Service: Endoscopy;;  . Prostate biopsy  03-02-15    neg    Family History  Problem Relation Age of Onset  . Prostate cancer Neg Hx   . Bladder Cancer Neg Hx   . Kidney cancer Neg Hx     Social History Social History  Substance Use Topics  . Smoking status: Never Smoker   . Smokeless tobacco: Never Used   . Alcohol Use: No    No Known Allergies  Current Outpatient Prescriptions  Medication Sig Dispense Refill  . CIALIS 5 MG tablet TAKE 1 TABLET BY MOUTH EVERY DAY FOR BPH 30 tablet 12  . finasteride (PROSCAR) 5 MG tablet Take 1 tablet (5 mg total) by mouth daily. 30 tablet 11  . Naproxen Sodium (ALEVE PO) Take by mouth as needed.    . silodosin (RAPAFLO) 8 MG CAPS capsule Take 1 capsule (8 mg total) by mouth daily with breakfast. 30 capsule 11   No current facility-administered medications for this visit.    Review of Systems Review of Systems  Constitutional: Negative.   Respiratory: Negative.   Cardiovascular: Negative.     Blood pressure 120/78, pulse 82, resp. rate 14, height 6\' 3"  (1.905 m), weight 194 lb (87.998 kg).  Physical Exam Physical Exam  Constitutional: He is oriented to person, place, and time. He appears well-developed and well-nourished.  HENT:  Mouth/Throat: Oropharynx is clear and moist.  Eyes: Conjunctivae are normal. No scleral icterus.  Neck: Neck supple.  Cardiovascular: Normal rate, regular rhythm and normal heart sounds.   Pulmonary/Chest: Effort normal and breath sounds normal.  Abdominal: Normal appearance. A hernia (Umbilical hernia with omentum.) is present. Hernia confirmed positive in the Mccoy inguinal area and confirmed positive in the left inguinal area.  Genitourinary: Testes normal.  Lymphadenopathy:    He has no cervical adenopathy.  Neurological: He is alert and oriented to person, place, and time.  Skin: Skin is warm and dry.  Psychiatric: His behavior is normal.    Data Reviewed Prostate biopsies of earlier this month.  Assessment    Bilateral symptomatic inguinal hernias, umbilical hernia.    Plan        Hernia precautions and incarceration were discussed with the patient. If they develop symptoms of an incarcerated hernia, they were encouraged to seek prompt medical attention.  I have recommended repair of the hernia  using mesh on an outpatient basis in the near future. The risk of infection was reviewed. The role of prosthetic mesh to minimize the risk of recurrence was reviewed. Based on the presence of bilateral defects in the groin and think he be best served by a laparoscopic repair. The umbilical hernia would be repaired primarily during wound closure.  Patient's surgery has been scheduled for 04-09-15 at [facility].  PCP:  Lelon Huh  This information has been scribed by Karie Fetch RN, BSN,BC.  Robert Bellow 04/04/2015, 5:42 PM

## 2015-04-04 NOTE — Patient Instructions (Addendum)
The patient is aware to call back for any questions or concerns. Hernia, Adult A hernia is the bulging of an organ or tissue through a weak spot in the muscles of the abdomen (abdominal wall). Hernias develop most often near the navel or groin. There are many kinds of hernias. Common kinds include:  Femoral hernia. This kind of hernia develops under the groin in the upper thigh area.  Inguinal hernia. This kind of hernia develops in the groin or scrotum.  Umbilical hernia. This kind of hernia develops near the navel.  Hiatal hernia. This kind of hernia causes part of the stomach to be pushed up into the chest.  Incisional hernia. This kind of hernia bulges through a scar from an abdominal surgery. CAUSES This condition may be caused by:  Heavy lifting.  Coughing over a long period of time.  Straining to have a bowel movement.  An incision made during an abdominal surgery.  A birth defect (congenital defect).  Excess weight or obesity.  Smoking.  Poor nutrition.  Cystic fibrosis.  Excess fluid in the abdomen.  Undescended testicles. SYMPTOMS Symptoms of a hernia include:  A lump on the abdomen. This is the first sign of a hernia. The lump may become more obvious with standing, straining, or coughing. It may get bigger over time if it is not treated or if the condition causing it is not treated.  Pain. A hernia is usually painless, but it may become painful over time if treatment is delayed. The pain is usually dull and may get worse with standing or lifting heavy objects. Sometimes a hernia gets tightly squeezed in the weak spot (strangulated) or stuck there (incarcerated) and causes additional symptoms. These symptoms may include:  Vomiting.  Nausea.  Constipation.  Irritability. DIAGNOSIS A hernia may be diagnosed with:  A physical exam. During the exam your health care provider may ask you to cough or to make a specific movement, because a hernia is usually  more visible when you move.  Imaging tests. These can include:  X-rays.  Ultrasound.  CT scan. TREATMENT A hernia that is small and painless may not need to be treated. A hernia that is large or painful may be treated with surgery. Inguinal hernias may be treated with surgery to prevent incarceration or strangulation. Strangulated hernias are always treated with surgery, because lack of blood to the trapped organ or tissue can cause it to die. Surgery to treat a hernia involves pushing the bulge back into place and repairing the weak part of the abdomen. HOME CARE INSTRUCTIONS  Avoid straining.  Do not lift anything heavier than 10 lb (4.5 kg).  Lift with your leg muscles, not your back muscles. This helps avoid strain.  When coughing, try to cough gently.  Prevent constipation. Constipation leads to straining with bowel movements, which can make a hernia worse or cause a hernia repair to break down. You can prevent constipation by:  Eating a high-fiber diet that includes plenty of fruits and vegetables.  Drinking enough fluids to keep your urine clear or pale yellow. Aim to drink 6-8 glasses of water per day.  Using a stool softener as directed by your health care provider.  Lose weight, if you are overweight.  Do not use any tobacco products, including cigarettes, chewing tobacco, or electronic cigarettes. If you need help quitting, ask your health care provider.  Keep all follow-up visits as directed by your health care provider. This is important. Your health care provider may   need to monitor your condition. SEEK MEDICAL CARE IF:  You have swelling, redness, and pain in the affected area.  Your bowel habits change. SEEK IMMEDIATE MEDICAL CARE IF:  You have a fever.  You have abdominal pain that is getting worse.  You feel nauseous or you vomit.  You cannot push the hernia back in place by gently pressing on it while you are lying down.  The hernia:  Changes in  shape or size.  Is stuck outside the abdomen.  Becomes discolored.  Feels hard or tender.   This information is not intended to replace advice given to you by your health care provider. Make sure you discuss any questions you have with your health care provider.   Document Released: 12/23/2004 Document Revised: 01/13/2014 Document Reviewed: 11/02/2013 Elsevier Interactive Patient Education 2016 [employer].  Patient's surgery has been scheduled for 04-09-15 at [facility].

## 2015-04-06 ENCOUNTER — Encounter
Admission: RE | Admit: 2015-04-06 | Discharge: 2015-04-06 | Disposition: A | Discharge status: Home / Self Care | Payer: BC Managed Care – PPO | Source: Ambulatory Visit | Attending: General Surgery | Admitting: General Surgery

## 2015-04-06 DIAGNOSIS — M1990 Unspecified osteoarthritis, unspecified site: Secondary | ICD-10-CM | POA: Diagnosis not present

## 2015-04-06 DIAGNOSIS — Z79899 Other long term (current) drug therapy: Secondary | ICD-10-CM | POA: Diagnosis not present

## 2015-04-06 DIAGNOSIS — M5030 Other cervical disc degeneration, unspecified cervical region: Secondary | ICD-10-CM | POA: Diagnosis not present

## 2015-04-06 DIAGNOSIS — M5136 Other intervertebral disc degeneration, lumbar region: Secondary | ICD-10-CM | POA: Diagnosis not present

## 2015-04-06 DIAGNOSIS — K219 Gastro-esophageal reflux disease without esophagitis: Secondary | ICD-10-CM | POA: Diagnosis not present

## 2015-04-06 DIAGNOSIS — Z9889 Other specified postprocedural states: Secondary | ICD-10-CM | POA: Diagnosis not present

## 2015-04-06 DIAGNOSIS — K429 Umbilical hernia without obstruction or gangrene: Secondary | ICD-10-CM | POA: Diagnosis present

## 2015-04-06 DIAGNOSIS — Z8042 Family history of malignant neoplasm of prostate: Secondary | ICD-10-CM | POA: Diagnosis not present

## 2015-04-06 DIAGNOSIS — Z8052 Family history of malignant neoplasm of bladder: Secondary | ICD-10-CM | POA: Diagnosis not present

## 2015-04-06 DIAGNOSIS — N486 Induration penis plastica: Secondary | ICD-10-CM | POA: Diagnosis not present

## 2015-04-06 DIAGNOSIS — Z86010 Personal history of colonic polyps: Secondary | ICD-10-CM | POA: Diagnosis not present

## 2015-04-06 DIAGNOSIS — K4020 Bilateral inguinal hernia, without obstruction or gangrene, not specified as recurrent: Secondary | ICD-10-CM | POA: Diagnosis present

## 2015-04-06 DIAGNOSIS — R350 Frequency of micturition: Secondary | ICD-10-CM | POA: Diagnosis not present

## 2015-04-06 DIAGNOSIS — N401 Enlarged prostate with lower urinary tract symptoms: Secondary | ICD-10-CM | POA: Diagnosis not present

## 2015-04-06 DIAGNOSIS — Z8051 Family history of malignant neoplasm of kidney: Secondary | ICD-10-CM | POA: Diagnosis not present

## 2015-04-06 NOTE — Patient Instructions (Signed)
  Your procedure is scheduled [id number] April 3 , 2017. Report to Same Day Surgery. To find out your arrival time please call [phone] between 1PM - 3PM  today.  Remember: Instructions that are not followed completely may result in serious medical risk, up to and including death, or upon the discretion of your surgeon and anesthesiologist your surgery may need to be rescheduled.    _x___ 1. Do not eat food or drink liquids after midnight. No gum chewing or hard candies.     ____ 2. No Alcohol for 24 hours before or after surgery.   ____ 3. Bring all medications with you on the day of surgery if instructed.    __x__ 4. Notify your doctor if there is any change in your medical condition     (cold, fever, infections).     Do not wear jewelry, make-up, hairpins, clips or nail polish.  Do not wear lotions, powders, or perfumes. You may wear deodorant.  Do not shave 48 hours prior to surgery. Men may shave face and neck.  Do not bring valuables to the hospital.    [facility] is not responsible for any belongings or valuables.               Contacts, dentures or bridgework may not be worn into surgery.  Leave your suitcase in the car. After surgery it may be brought to your room.  For patients admitted to the hospital, discharge time is determined by your treatment team.   Patients discharged the day of surgery will not be allowed to drive home.    Please read over the following fact sheets that you were given:   [facility] Preparing for Surgery  ____ Take these medicines the morning of surgery with A SIP OF WATER: NONE     ____ Fleet Enema (as directed)   _x___ Use CHG Soap as directed on instruction sheet  ____ Use inhalers on the day of surgery and bring to hospital day of surgery  ____ Stop metformin 2 days prior to surgery    ____ Take 1/2 of usual insulin dose the night before surgery and none on the morning of surgery.   ____ Stop Coumadin/Plavix/aspirin on does  not apply  _x___ Stop Anti-inflammatories such as  Aleve, Ibuprofen, Motrin, Naproxen, Naprosyn, Goodies powders or aspirin products. OK to take Tylenol.   ____ Stop supplements until after surgery.    ____ Bring C-Pap to the hospital.

## 2015-04-09 ENCOUNTER — Ambulatory Visit: Payer: BC Managed Care – PPO | Admitting: Anesthesiology

## 2015-04-09 ENCOUNTER — Encounter
Admission: RE | Disposition: A | Discharge status: Home / Self Care | Payer: Self-pay | Source: Ambulatory Visit | Attending: General Surgery

## 2015-04-09 ENCOUNTER — Observation Stay
Admission: RE | Admit: 2015-04-09 | Discharge: 2015-04-10 | Disposition: A | Discharge status: Home / Self Care | Payer: BC Managed Care – PPO | Source: Ambulatory Visit | Attending: General Surgery | Admitting: General Surgery

## 2015-04-09 ENCOUNTER — Encounter: Payer: Self-pay | Admitting: *Deleted

## 2015-04-09 DIAGNOSIS — Z8052 Family history of malignant neoplasm of bladder: Secondary | ICD-10-CM | POA: Insufficient documentation

## 2015-04-09 DIAGNOSIS — Z86010 Personal history of colonic polyps: Secondary | ICD-10-CM | POA: Insufficient documentation

## 2015-04-09 DIAGNOSIS — K4090 Unilateral inguinal hernia, without obstruction or gangrene, not specified as recurrent: Secondary | ICD-10-CM

## 2015-04-09 DIAGNOSIS — Z8051 Family history of malignant neoplasm of kidney: Secondary | ICD-10-CM | POA: Insufficient documentation

## 2015-04-09 DIAGNOSIS — K4020 Bilateral inguinal hernia, without obstruction or gangrene, not specified as recurrent: Secondary | ICD-10-CM | POA: Diagnosis present

## 2015-04-09 DIAGNOSIS — Z8042 Family history of malignant neoplasm of prostate: Secondary | ICD-10-CM | POA: Insufficient documentation

## 2015-04-09 DIAGNOSIS — R350 Frequency of micturition: Secondary | ICD-10-CM | POA: Insufficient documentation

## 2015-04-09 DIAGNOSIS — N401 Enlarged prostate with lower urinary tract symptoms: Secondary | ICD-10-CM | POA: Insufficient documentation

## 2015-04-09 DIAGNOSIS — Z9889 Other specified postprocedural states: Secondary | ICD-10-CM | POA: Insufficient documentation

## 2015-04-09 DIAGNOSIS — M5030 Other cervical disc degeneration, unspecified cervical region: Secondary | ICD-10-CM | POA: Insufficient documentation

## 2015-04-09 DIAGNOSIS — N486 Induration penis plastica: Secondary | ICD-10-CM | POA: Insufficient documentation

## 2015-04-09 DIAGNOSIS — M1990 Unspecified osteoarthritis, unspecified site: Secondary | ICD-10-CM | POA: Insufficient documentation

## 2015-04-09 DIAGNOSIS — Z79899 Other long term (current) drug therapy: Secondary | ICD-10-CM | POA: Insufficient documentation

## 2015-04-09 DIAGNOSIS — K219 Gastro-esophageal reflux disease without esophagitis: Secondary | ICD-10-CM | POA: Insufficient documentation

## 2015-04-09 DIAGNOSIS — K439 Ventral hernia without obstruction or gangrene: Secondary | ICD-10-CM | POA: Diagnosis not present

## 2015-04-09 DIAGNOSIS — M5136 Other intervertebral disc degeneration, lumbar region: Secondary | ICD-10-CM | POA: Insufficient documentation

## 2015-04-09 DIAGNOSIS — K429 Umbilical hernia without obstruction or gangrene: Secondary | ICD-10-CM | POA: Diagnosis not present

## 2015-04-09 HISTORY — PX: INGUINAL HERNIA REPAIR: SHX194

## 2015-04-09 HISTORY — PX: UMBILICAL HERNIA REPAIR: SHX196

## 2015-04-09 HISTORY — PX: HERNIA REPAIR: SHX51

## 2015-04-09 SURGERY — REPAIR, HERNIA, INGUINAL, LAPAROSCOPIC
Anesthesia: General | Wound class: Clean

## 2015-04-09 MED ORDER — MORPHINE SULFATE (PF) 4 MG/ML IV SOLN: 4.0000 mg | INTRAVENOUS | Status: DC | PRN

## 2015-04-09 MED ORDER — ONDANSETRON HCL 4 MG/2ML IJ SOLN
INTRAMUSCULAR | Status: DC | PRN
  Administered 2015-04-09: 4 mg via INTRAVENOUS

## 2015-04-09 MED ORDER — BUPIVACAINE HCL 0.5 % IJ SOLN
INTRAMUSCULAR | Status: DC | PRN
  Administered 2015-04-09: 30 mL

## 2015-04-09 MED ORDER — THROMBIN 5000 UNITS EX SOLR
CUTANEOUS | Status: AC
  Filled 2015-04-09: qty 5000

## 2015-04-09 MED ORDER — FENTANYL CITRATE (PF) 100 MCG/2ML IJ SOLN
INTRAMUSCULAR | Status: DC | PRN
  Administered 2015-04-09: 100 ug via INTRAVENOUS
  Administered 2015-04-09: 50 ug via INTRAVENOUS

## 2015-04-09 MED ORDER — ACETAMINOPHEN 650 MG RE SUPP: 650.0000 mg | RECTAL | Status: DC

## 2015-04-09 MED ORDER — TADALAFIL 5 MG PO TABS
5.0000 mg | ORAL_TABLET | Freq: Every day | ORAL | Status: DC | PRN
  Filled 2015-04-09: qty 1

## 2015-04-09 MED ORDER — FENTANYL CITRATE (PF) 100 MCG/2ML IJ SOLN
INTRAMUSCULAR | Status: AC
  Administered 2015-04-09: 25 ug via INTRAVENOUS
  Filled 2015-04-09: qty 2

## 2015-04-09 MED ORDER — HYDROCODONE-ACETAMINOPHEN 5-325 MG PO TABS
ORAL_TABLET | ORAL | Status: AC
  Administered 2015-04-09: 1 via ORAL
  Filled 2015-04-09: qty 2

## 2015-04-09 MED ORDER — ONDANSETRON HCL 4 MG/2ML IJ SOLN: 4.0000 mg | Freq: Four times a day (QID) | INTRAMUSCULAR | Status: DC | PRN

## 2015-04-09 MED ORDER — CEFAZOLIN SODIUM-DEXTROSE 2-4 GM/100ML-% IV SOLN
INTRAVENOUS | Status: AC
  Administered 2015-04-09: 2000 mg via INTRAVENOUS
  Filled 2015-04-09: qty 100

## 2015-04-09 MED ORDER — LIDOCAINE HCL (CARDIAC) 20 MG/ML IV SOLN
INTRAVENOUS | Status: DC | PRN
  Administered 2015-04-09: 100 mg via INTRAVENOUS

## 2015-04-09 MED ORDER — ACETAMINOPHEN 10 MG/ML IV SOLN
INTRAVENOUS | Status: AC
  Filled 2015-04-09: qty 100

## 2015-04-09 MED ORDER — MIDAZOLAM HCL 2 MG/2ML IJ SOLN
INTRAMUSCULAR | Status: DC | PRN
  Administered 2015-04-09: 2 mg via INTRAVENOUS

## 2015-04-09 MED ORDER — CEFAZOLIN SODIUM-DEXTROSE 2-4 GM/100ML-% IV SOLN
2.0000 g | INTRAVENOUS | Status: AC
  Administered 2015-04-09: 2000 mg via INTRAVENOUS

## 2015-04-09 MED ORDER — HYDROCODONE-ACETAMINOPHEN 5-325 MG PO TABS: 1.0000 | ORAL_TABLET | ORAL | Status: DC | PRN

## 2015-04-09 MED ORDER — ONDANSETRON HCL 4 MG PO TABS: 4.0000 mg | ORAL_TABLET | ORAL | Status: DC | PRN

## 2015-04-09 MED ORDER — HYDROCODONE-ACETAMINOPHEN 5-325 MG PO TABS
1.0000 | ORAL_TABLET | ORAL | Status: DC | PRN
  Administered 2015-04-09 (×2): 1 via ORAL

## 2015-04-09 MED ORDER — DEXAMETHASONE SODIUM PHOSPHATE 10 MG/ML IJ SOLN
INTRAMUSCULAR | Status: DC | PRN
  Administered 2015-04-09: 10 mg via INTRAVENOUS

## 2015-04-09 MED ORDER — PROPOFOL 10 MG/ML IV BOLUS
INTRAVENOUS | Status: DC | PRN
  Administered 2015-04-09: 140 mg via INTRAVENOUS

## 2015-04-09 MED ORDER — OXYCODONE HCL 5 MG PO TABS
10.0000 mg | ORAL_TABLET | ORAL | Status: DC | PRN
  Administered 2015-04-09: 10 mg via ORAL
  Filled 2015-04-09: qty 2

## 2015-04-09 MED ORDER — TAMSULOSIN HCL 0.4 MG PO CAPS
0.4000 mg | ORAL_CAPSULE | Freq: Every day | ORAL | Status: DC
  Administered 2015-04-09: 0.4 mg via ORAL
  Filled 2015-04-09: qty 1

## 2015-04-09 MED ORDER — ACETAMINOPHEN 325 MG PO TABS
650.0000 mg | ORAL_TABLET | ORAL | Status: DC
  Administered 2015-04-09 – 2015-04-10 (×4): 650 mg via ORAL
  Filled 2015-04-09 (×5): qty 2

## 2015-04-09 MED ORDER — SUCCINYLCHOLINE CHLORIDE 20 MG/ML IJ SOLN
INTRAMUSCULAR | Status: DC | PRN
  Administered 2015-04-09: 100 mg via INTRAVENOUS

## 2015-04-09 MED ORDER — FAMOTIDINE 20 MG PO TABS
20.0000 mg | ORAL_TABLET | Freq: Once | ORAL | Status: AC
  Administered 2015-04-09: 20 mg via ORAL

## 2015-04-09 MED ORDER — SUGAMMADEX SODIUM 200 MG/2ML IV SOLN
INTRAVENOUS | Status: DC | PRN
  Administered 2015-04-09: 176 mg via INTRAVENOUS

## 2015-04-09 MED ORDER — FAMOTIDINE 20 MG PO TABS
ORAL_TABLET | ORAL | Status: AC
  Administered 2015-04-09: 20 mg via ORAL
  Filled 2015-04-09: qty 1

## 2015-04-09 MED ORDER — FINASTERIDE 5 MG PO TABS
5.0000 mg | ORAL_TABLET | Freq: Every evening | ORAL | Status: DC
  Administered 2015-04-09: 5 mg via ORAL
  Filled 2015-04-09: qty 1

## 2015-04-09 MED ORDER — ACETAMINOPHEN 10 MG/ML IV SOLN
INTRAVENOUS | Status: DC | PRN
  Administered 2015-04-09: 1000 mg via INTRAVENOUS

## 2015-04-09 MED ORDER — LACTATED RINGERS IV SOLN
INTRAVENOUS | Status: DC
  Administered 2015-04-09 (×4): via INTRAVENOUS

## 2015-04-09 MED ORDER — KETOROLAC TROMETHAMINE 30 MG/ML IJ SOLN
INTRAMUSCULAR | Status: DC | PRN
  Administered 2015-04-09: 30 mg via INTRAVENOUS

## 2015-04-09 MED ORDER — LACTATED RINGERS IV SOLN
INTRAVENOUS | Status: DC
  Administered 2015-04-09 – 2015-04-10 (×2): via INTRAVENOUS

## 2015-04-09 MED ORDER — ONDANSETRON HCL 4 MG/2ML IJ SOLN
INTRAMUSCULAR | Status: AC
  Administered 2015-04-09: 4 mg via INTRAVENOUS
  Filled 2015-04-09: qty 2

## 2015-04-09 MED ORDER — FENTANYL CITRATE (PF) 100 MCG/2ML IJ SOLN
25.0000 ug | INTRAMUSCULAR | Status: DC | PRN
  Administered 2015-04-09 (×2): 25 ug via INTRAVENOUS

## 2015-04-09 MED ORDER — ROCURONIUM BROMIDE 100 MG/10ML IV SOLN
INTRAVENOUS | Status: DC | PRN
  Administered 2015-04-09 (×3): 10 mg via INTRAVENOUS
  Administered 2015-04-09: 40 mg via INTRAVENOUS

## 2015-04-09 MED ORDER — HYDROCODONE-ACETAMINOPHEN 5-325 MG PO TABS
ORAL_TABLET | ORAL | Status: AC
  Filled 2015-04-09: qty 1

## 2015-04-09 MED ORDER — BUPIVACAINE HCL (PF) 0.5 % IJ SOLN
INTRAMUSCULAR | Status: AC
  Filled 2015-04-09: qty 30

## 2015-04-09 MED ORDER — KETOROLAC TROMETHAMINE 30 MG/ML IJ SOLN
30.0000 mg | Freq: Three times a day (TID) | INTRAMUSCULAR | Status: DC
  Administered 2015-04-09 – 2015-04-10 (×4): 30 mg via INTRAVENOUS
  Filled 2015-04-09 (×4): qty 1

## 2015-04-09 MED ORDER — ONDANSETRON HCL 4 MG/2ML IJ SOLN
4.0000 mg | Freq: Once | INTRAMUSCULAR | Status: AC | PRN
  Administered 2015-04-09: 4 mg via INTRAVENOUS

## 2015-04-09 MED ORDER — THROMBIN 5000 UNITS EX SOLR
OROMUCOSAL | Status: DC | PRN
  Administered 2015-04-09: 5 mL via TOPICAL

## 2015-04-09 SURGICAL SUPPLY — 63 items
APPLICATOR SURGIFLO (MISCELLANEOUS) ×3 IMPLANT
BLADE SURG 11 STRL SS SAFETY (MISCELLANEOUS) ×3 IMPLANT
BLADE SURG 15 STRL SS SAFETY (BLADE) ×3 IMPLANT
CANISTER SUCT 1200ML W/VALVE (MISCELLANEOUS) ×3 IMPLANT
CANNULA DILATOR 12 W/SLV (CANNULA) IMPLANT
CANNULA DILATOR 5 W/SLV (CANNULA) ×6 IMPLANT
CATH TRAY 16F METER LATEX (MISCELLANEOUS) ×3 IMPLANT
CHLORAPREP W/TINT 26ML (MISCELLANEOUS) ×3 IMPLANT
CLEANER CAUTERY TIP 5X5 PAD (MISCELLANEOUS) ×2 IMPLANT
DEVICE SECURE STRAP 25 ABSORB (INSTRUMENTS) ×6 IMPLANT
DISSECT BALLN SPACEMKR OVL PDB (BALLOONS)
DISSECTOR BALLN SPCMKR OVL PDB (BALLOONS) IMPLANT
DISSECTOR KITTNER STICK (MISCELLANEOUS) IMPLANT
DISSECTORS/KITTNER STICK (MISCELLANEOUS)
DRAPE LAPAROTOMY 100X77 ABD (DRAPES) IMPLANT
DRESSING TELFA 4X3 1S ST N-ADH (GAUZE/BANDAGES/DRESSINGS) ×3 IMPLANT
DRSG TEGADERM 2-3/8X2-3/4 SM (GAUZE/BANDAGES/DRESSINGS) ×9 IMPLANT
DRSG TEGADERM 4X4.75 (GAUZE/BANDAGES/DRESSINGS) ×3 IMPLANT
ELECT REM PT RETURN 9FT ADLT (ELECTROSURGICAL) ×3
ELECTRODE REM PT RTRN 9FT ADLT (ELECTROSURGICAL) ×2 IMPLANT
GAUZE SPONGE 4X4 12PLY STRL (GAUZE/BANDAGES/DRESSINGS) IMPLANT
GLOVE BIO SURGEON STRL SZ7.5 (GLOVE) ×12 IMPLANT
GLOVE INDICATOR 8.0 STRL GRN (GLOVE) ×6 IMPLANT
GOWN STRL REUS W/ TWL LRG LVL3 (GOWN DISPOSABLE) ×4 IMPLANT
GOWN STRL REUS W/TWL LRG LVL3 (GOWN DISPOSABLE) ×2
KIT RM TURNOVER STRD PROC AR (KITS) ×3 IMPLANT
LABEL OR SOLS (LABEL) ×3 IMPLANT
MESH 3DMAX 3X5 LT MED (Mesh General) ×3 IMPLANT
MESH 3DMAX 3X5 RT MED (Mesh General) ×3 IMPLANT
NDL INSUFF ACCESS 14 VERSASTEP (NEEDLE) ×3 IMPLANT
NDL SAFETY 22GX1.5 (NEEDLE) ×6 IMPLANT
NEEDLE FILTER BLUNT 18X 1/2SAF (NEEDLE) ×1
NEEDLE FILTER BLUNT 18X1 1/2 (NEEDLE) ×2 IMPLANT
NEEDLE HYPO 25X1 1.5 SAFETY (NEEDLE) IMPLANT
NS IRRIG 500ML POUR BTL (IV SOLUTION) ×3 IMPLANT
PACK BASIN MINOR ARMC (MISCELLANEOUS) IMPLANT
PACK LAP CHOLECYSTECTOMY (MISCELLANEOUS) ×3 IMPLANT
PAD CLEANER CAUTERY TIP 5X5 (MISCELLANEOUS) ×1
PENCIL ELECTRO HAND CTR (MISCELLANEOUS) ×3 IMPLANT
SCISSORS METZENBAUM CVD 33 (INSTRUMENTS) ×3 IMPLANT
SPOGE SURGIFLO 8M (HEMOSTASIS) ×1
SPONGE SURGIFLO 8M (HEMOSTASIS) ×2 IMPLANT
STRIP CLOSURE SKIN 1/2X4 (GAUZE/BANDAGES/DRESSINGS) ×3 IMPLANT
SUT PROLENE 0 CT 1 30 (SUTURE) ×3 IMPLANT
SUT SURGILON 0 BLK (SUTURE) IMPLANT
SUT VIC AB 0 SH 27 (SUTURE) IMPLANT
SUT VIC AB 3-0 54X BRD REEL (SUTURE) IMPLANT
SUT VIC AB 3-0 BRD 54 (SUTURE)
SUT VIC AB 3-0 SH 27 (SUTURE) ×1
SUT VIC AB 3-0 SH 27X BRD (SUTURE) ×2 IMPLANT
SUT VIC AB 4-0 FS2 27 (SUTURE) ×3 IMPLANT
SUT VICRYL 0 AB UR-6 (SUTURE) ×6 IMPLANT
SUT VICRYL 0 TIES 12 18 (SUTURE) IMPLANT
SWABSTK COMLB BENZOIN TINCTURE (MISCELLANEOUS) ×3 IMPLANT
SYR 3ML LL SCALE MARK (SYRINGE) ×3 IMPLANT
SYR CONTROL 10ML (SYRINGE) ×3 IMPLANT
SYRINGE 10CC LL (SYRINGE) ×3 IMPLANT
TACKER 5MM HERNIA 3.5CML NAB (ENDOMECHANICALS) IMPLANT
TROCAR BALLN 10M OMST10SB SPAC (TROCAR) IMPLANT
TROCAR XCEL 12X100 BLDLESS (ENDOMECHANICALS) IMPLANT
TROCAR XCEL BLUNT TIP 100MML (ENDOMECHANICALS) ×3 IMPLANT
TUBING INSUFFLATOR HI FLOW (MISCELLANEOUS) ×3 IMPLANT
WATER STERILE IRR 1000ML POUR (IV SOLUTION) IMPLANT

## 2015-04-09 NOTE — Op Note (Signed)
Preoperative diagnosis: Bilateral direct inguinal hernias, umbilical hernia.   Postoperative diagnosis: Same.  Operative procedure: Bilateral direct inguinal hernia repair with Bard 3-D Max mesh, lot number left [id number], right [id number]. Primary umbilical hernia repair.  Operative surgeon: Hervey Ard, M.D.  Assistant: Ellwood Sayers, M.D.  Anesthesia: Gen. endotracheal. Marcaine 0.5%, plain, 30 mL local infiltration.  Estimated blood loss: 30 mL.  Clinical note: This 60 year old male with BPH and has developed bilateral direct inguinal hernias as well as an asymptomatic umbilical hernia. Recent evaluation by the GU service including prostate biopsy showed no evidence of malignancy. Urinary symptoms are well-controlled on Rapaflo and Flomax. He was considered a candidate for repair. He received Kefzol prior the procedure. Hair was removed from the surgical site with clippers.  Operative note with the patient under adequate general endotracheal anesthesia Foley catheter was placed by the nurse. Positive return of urine. The abdomen was then prepped with ChloraPrep and draped. An infraumbilical incision was made in the umbilical skin elevated. The fascial defect was exposed. The hernia sac was excised and discarded. A 12 mm Hassan cannula was patent placed under direct vision and insufflation completed with 10 mmHg mercury. The patient was placed in Trendelenburg position. No intra-abdominal adhesions. The left side was approached first the peritoneum was opened approximately 3 cm above the direct defect. This was extended lateral to the inferior epigastric vessels. The peritoneum was swept inferiorly to create a space and expose the inguinal ligament and pubic tubercle. A medium Bard 3-D Max mesh was placed and appropriately orientated. This was tacked to the pubic tubercle and then along the inguinal ligament. Superiorly care was taken to avoid the inferior epigastric vessels. The mesh was smoothed  in place in the peritoneum approximated over the mesh making use of a secure strap tacker as had been used to fixate the mesh.  Attention was turned to the right groin. A similar technique was utilized. The 3-D Max mesh from Bard was again placed in the wound. In placing the initial medial tacks there was some bleeding, likely from a vessel along the course of the pubic tubercle. The area was aspirated and pressure held with a sponge. There was approximately 30 mL blood loss. 10 mL of low seal was placed in this area to assure hemostasis. The mesh was anchored along the inguinal ligament with the secure strap tacker and 2 lateral tacks applied as well. These superior and medial aspect again tacked under direct vision. The inferior epigastric vessels avoided. The peritoneum was used to cover this defect. The abdomen was desufflated and the fascia at the umbilicus was approximated with 0 Prolene figure-of-eight suture 2. The umbilical skin was tacked to the fascia with 3-0 Vicryl. The adipose layer was approximated with running 3-0 Vicryl suture and the skin closed with a running 4-0 Vicryl septic suture.  Benzoin, Steri-Strips, Telfa and tape dressings were applied.  The Foley catheter was removed with only about 10 mL of urine recorded. The procedure again was completed in the Trendelenburg position.  The patient tolerated the procedure well and was taken to recovery in stable condition.

## 2015-04-09 NOTE — Anesthesia Preprocedure Evaluation (Signed)
Anesthesia Evaluation  Patient identified by MRN, date of birth, ID band Patient awake    Reviewed: Allergy & Precautions, NPO status , Patient's Chart, lab work & pertinent test results, reviewed documented beta blocker date and time   Airway Mallampati: II  TM Distance: >3 FB     Dental  (+) Chipped   Pulmonary           Cardiovascular      Neuro/Psych    GI/Hepatic GERD  Controlled,  Endo/Other    Renal/GU      Musculoskeletal  (+) Arthritis ,   Abdominal   Peds  Hematology   Anesthesia Other Findings   Reproductive/Obstetrics                             Anesthesia Physical Anesthesia Plan  ASA: II  Anesthesia Plan: General   Post-op Pain Management:    Induction: Intravenous  Airway Management Planned: Oral ETT  Additional Equipment:   Intra-op Plan:   Post-operative Plan:   Informed Consent: I have reviewed the patients History and Physical, chart, labs and discussed the procedure including the risks, benefits and alternatives for the proposed anesthesia with the patient or authorized representative who has indicated his/her understanding and acceptance.     Plan Discussed with: CRNA  Anesthesia Plan Comments:         Anesthesia Quick Evaluation

## 2015-04-09 NOTE — Discharge Instructions (Signed)

## 2015-04-09 NOTE — Anesthesia Postprocedure Evaluation (Signed)
Anesthesia Post Note  Patient: Micheal Mccoy  Procedure(s) Performed: Procedure(s) (LRB): LAPAROSCOPIC INGUINAL HERNIA (Bilateral) HERNIA REPAIR UMBILICAL ADULT (N/A)  Patient location during evaluation: PACU Anesthesia Type: General Level of consciousness: awake and alert Pain management: pain level controlled Vital Signs Assessment: post-procedure vital signs reviewed and stable Respiratory status: spontaneous breathing, nonlabored ventilation, respiratory function stable and patient connected to nasal cannula oxygen Cardiovascular status: blood pressure returned to baseline and stable Postop Assessment: no signs of nausea or vomiting Anesthetic complications: no    Last Vitals:  Filed Vitals:   04/09/15 1226 04/09/15 1348  BP: 119/73 124/79  Pulse: 71 71  Temp:    Resp: 18 18    Last Pain:  Filed Vitals:   04/09/15 1353  PainSc: Mona

## 2015-04-09 NOTE — Progress Notes (Signed)
Patient reports significant groin pain and nausea. Still somewhat sleepy. Will place in observation.

## 2015-04-09 NOTE — Transfer of Care (Signed)
Immediate Anesthesia Transfer of Care Note  Patient: Micheal Mccoy  Procedure(s) Performed: Procedure(s): LAPAROSCOPIC INGUINAL HERNIA (Bilateral) HERNIA REPAIR UMBILICAL ADULT (N/A)  Patient Location: PACU  Anesthesia Type:General  Level of Consciousness: sedated  Airway & Oxygen Therapy: Patient Spontanous Breathing and Patient connected to face mask oxygen  Post-op Assessment: Report given to RN and Post -op Vital signs reviewed and stable  Post vital signs: Reviewed and stable  Last Vitals:  Filed Vitals:   04/09/15 0615  BP: 143/88  Pulse: 81  Temp: 36.8 C  Resp: 18    Complications: No apparent anesthesia complications

## 2015-04-09 NOTE — Anesthesia Procedure Notes (Signed)
Procedure Name: Intubation Date/Time: 04/09/2015 7:30 AM Performed by: Nelda Marseille Pre-anesthesia Checklist: Patient identified, Patient being monitored, Timeout performed, Emergency Drugs available and Suction available Patient Re-evaluated:Patient Re-evaluated prior to inductionOxygen Delivery Method: Circle system utilized Preoxygenation: Pre-oxygenation with 100% oxygen Intubation Type: IV induction Ventilation: Mask ventilation without difficulty Laryngoscope Size: Mac and 3 Grade View: Grade I Tube type: Oral Tube size: 7.5 mm Number of attempts: 1 Airway Equipment and Method: Stylet Placement Confirmation: ETT inserted through vocal cords under direct vision,  positive ETCO2 and breath sounds checked- equal and bilateral Secured at: 22 cm Tube secured with: Tape Dental Injury: Teeth and Oropharynx as per pre-operative assessment

## 2015-04-09 NOTE — H&P (Signed)
No change in clinical history or plans.

## 2015-04-10 DIAGNOSIS — K429 Umbilical hernia without obstruction or gangrene: Secondary | ICD-10-CM | POA: Diagnosis not present

## 2015-04-10 NOTE — Final Progress Note (Signed)
AVSS. Voiding well. Pain under much better control. Up brushing teeth this AM. Wounds: Clean. Minimal edema in groin. Home after noon meal if ambulation goes well.

## 2015-04-10 NOTE — Progress Notes (Signed)
Pt has ambulated twice in the hallway. Pain is under control. Pt was able to eat a regular diet and tolerate that well. Pt is ready to home once he has stayed for re evaluation of pain.

## 2015-04-12 ENCOUNTER — Encounter: Payer: Self-pay | Admitting: General Surgery

## 2015-04-19 ENCOUNTER — Ambulatory Visit (INDEPENDENT_AMBULATORY_CARE_PROVIDER_SITE_OTHER): Payer: BC Managed Care – PPO | Admitting: General Surgery

## 2015-04-19 ENCOUNTER — Encounter: Payer: Self-pay | Admitting: General Surgery

## 2015-04-19 VITALS — BP 112/70 | HR 76 | Resp 12 | Ht 75.0 in | Wt 191.0 lb

## 2015-04-19 DIAGNOSIS — K429 Umbilical hernia without obstruction or gangrene: Secondary | ICD-10-CM

## 2015-04-19 DIAGNOSIS — K4090 Unilateral inguinal hernia, without obstruction or gangrene, not specified as recurrent: Secondary | ICD-10-CM

## 2015-04-19 NOTE — Progress Notes (Signed)
Patient ID: Micheal Mccoy, male   DOB: 04-Jun-1955, 60 y.o.   MRN: [id number]  Chief Complaint  Patient presents with  . Routine Post Op    inguinal hernia    HPI Micheal Mccoy is a 60 y.o. male here today for his post op inguinal and umbilical hernia repair done on 04/09/15. Patient states he is doing well.   No difficulty with urination or bowel function post procedure. HPI  Past Medical History  Diagnosis Date  . Peyronie's disease     W/ PENILE CURVATURE  . Frequency of urination   . Urgency of urination   . GERD (gastroesophageal reflux disease)   . BPH (benign prostatic hypertrophy)   . Benign neoplasm of ascending colon   . Esophagitis, reflux 09/29/2005  . Arthritis   . Degenerative disc disease, cervical   . DDD (degenerative disc disease), lumbar 01/18/2015    Past Surgical History  Procedure Laterality Date  . Nesbit procedure N/A 06/28/2012    Procedure: 16 DOT PLACTATION PROCEDURE;  Surgeon: Claybon Jabs, MD;  Location: [facility];  Service: Urology;  Laterality: N/A;  . Peyronies disease  06/2012  . Colonoscopy  06/2004    Done by Dr. Bary Castilla. No polyps  . Colonoscopy with propofol N/A 09/14/2014    Procedure: COLONOSCOPY WITH PROPOFOL;  Surgeon: Lucilla Lame, MD;  Location: [place];  Service: Endoscopy;  Laterality: N/A;  . Polypectomy  09/14/2014    Procedure: POLYPECTOMY;  Surgeon: Lucilla Lame, MD;  Location: [place];  Service: Endoscopy;;  . Prostate biopsy  03-02-15    neg  . Inguinal hernia repair Bilateral 04/09/2015    Procedure: LAPAROSCOPIC INGUINAL HERNIA;  Surgeon: Robert Bellow, MD;  Location: [facility];  Service: General;  Laterality: Bilateral;  . Umbilical hernia repair N/A 04/09/2015    Procedure: HERNIA REPAIR UMBILICAL ADULT;  Surgeon: Robert Bellow, MD;  Location: [facility];  Service: General;  Laterality: N/A;  . Hernia repair Bilateral 04/09/2015    Bard 3D Max mesh for bilateral inguinal hernias, primary  repair of umbilical henria.     Family History  Problem Relation Age of Onset  . Prostate cancer Neg Hx   . Bladder Cancer Neg Hx   . Kidney cancer Neg Hx     Social History Social History  Substance Use Topics  . Smoking status: Never Smoker   . Smokeless tobacco: Never Used  . Alcohol Use: No    No Known Allergies  Current Outpatient Prescriptions  Medication Sig Dispense Refill  . CIALIS 5 MG tablet TAKE 1 TABLET BY MOUTH EVERY DAY FOR BPH (Patient taking differently: TAKE 1 TABLET BY MOUTH EVERY DAY FOR BPH in pm) 30 tablet 12  . finasteride (PROSCAR) 5 MG tablet Take 1 tablet (5 mg total) by mouth daily. (Patient taking differently: Take 5 mg by mouth every evening. ) 30 tablet 11  . HYDROcodone-acetaminophen (NORCO) 5-325 MG tablet Take 1-2 tablets by mouth every 4 (four) hours as needed for moderate pain. 30 tablet 0  . silodosin (RAPAFLO) 8 MG CAPS capsule Take 1 capsule (8 mg total) by mouth daily with breakfast. 30 capsule 11   No current facility-administered medications for this visit.    Review of Systems Review of Systems  Constitutional: Negative.   Respiratory: Negative.   Cardiovascular: Negative.     Blood pressure 112/70, pulse 76, resp. rate 12, height 6\' 3"  (1.905 m), weight 191 lb (86.637 kg).  Physical  Exam Physical Exam  Constitutional: He is oriented to person, place, and time. He appears well-developed and well-nourished.  Abdominal:    Well healed incision at the umbilical area, Inguinal hernia repair are intact  Neurological: He is alert and oriented to person, place, and time.  Skin: Skin is warm and dry.       Assessment    Doing well status post laparoscopic bilateral inguinal hernia repair, open umbilical hernia repair.    Plan    The patient will increase his activity as tolerated. Proper lifting technique reviewed. He will begin free weight lifting using one of her extremity is a time and it 50% of his previous baseline.     Patient to return in one month. PCP:  Caryn Section This information has been scribed by Gaspar Cola CMA.   Robert Bellow 04/19/2015, 10:08 AM

## 2015-04-19 NOTE — Patient Instructions (Addendum)
Patient to return in one month. 

## 2015-05-22 ENCOUNTER — Encounter: Payer: Self-pay | Admitting: General Surgery

## 2015-05-22 ENCOUNTER — Ambulatory Visit (INDEPENDENT_AMBULATORY_CARE_PROVIDER_SITE_OTHER): Payer: BC Managed Care – PPO | Admitting: General Surgery

## 2015-05-22 VITALS — BP 120/72 | HR 62 | Resp 12 | Ht 75.0 in | Wt 189.0 lb

## 2015-05-22 DIAGNOSIS — K4090 Unilateral inguinal hernia, without obstruction or gangrene, not specified as recurrent: Secondary | ICD-10-CM

## 2015-05-22 DIAGNOSIS — K429 Umbilical hernia without obstruction or gangrene: Secondary | ICD-10-CM

## 2015-05-22 NOTE — Progress Notes (Signed)
Patient ID: Micheal Mccoy, male   DOB: 1955/08/23, 60 y.o.   MRN: [id number]  Chief Complaint  Patient presents with  . Routine Post Op    HPI Micheal Mccoy is a 60 y.o. male for his post op bilateral laparoscopic inguinal hernia repair and open umbilical hernia repair  completed on 04/09/15. Patient states he is doing well, tylenol controlling his pain. He has returned to the gym, and is reinstituting free weights as part of his exercise regimen. He reports taking care with lifting.  I personally reviewed the patient's history.   HPI  Past Medical History  Diagnosis Date  . Peyronie's disease     W/ PENILE CURVATURE  . Frequency of urination   . Urgency of urination   . GERD (gastroesophageal reflux disease)   . BPH (benign prostatic hypertrophy)   . Benign neoplasm of ascending colon   . Esophagitis, reflux 09/29/2005  . Arthritis   . Degenerative disc disease, cervical   . DDD (degenerative disc disease), lumbar 01/18/2015    Past Surgical History  Procedure Laterality Date  . Nesbit procedure N/A 06/28/2012    Procedure: 16 DOT PLACTATION PROCEDURE;  Surgeon: Claybon Jabs, MD;  Location: [facility];  Service: Urology;  Laterality: N/A;  . Peyronies disease  06/2012  . Colonoscopy  06/2004    Done by Dr. Bary Castilla. No polyps  . Colonoscopy with propofol N/A 09/14/2014    Procedure: COLONOSCOPY WITH PROPOFOL;  Surgeon: Lucilla Lame, MD;  Location: [place];  Service: Endoscopy;  Laterality: N/A;  . Polypectomy  09/14/2014    Procedure: POLYPECTOMY;  Surgeon: Lucilla Lame, MD;  Location: [place];  Service: Endoscopy;;  . Prostate biopsy  03-02-15    neg  . Inguinal hernia repair Bilateral 04/09/2015    Procedure: LAPAROSCOPIC INGUINAL HERNIA;  Surgeon: Robert Bellow, MD;  Location: [facility];  Service: General;  Laterality: Bilateral;  . Umbilical hernia repair N/A 04/09/2015    Procedure: HERNIA REPAIR UMBILICAL ADULT;  Surgeon: Robert Bellow, MD;  Location: [facility];  Service: General;  Laterality: N/A;  . Hernia repair Bilateral 04/09/2015    Bard 3D Max mesh for bilateral inguinal hernias, primary repair of umbilical henria.     Family History  Problem Relation Age of Onset  . Prostate cancer Neg Hx   . Bladder Cancer Neg Hx   . Kidney cancer Neg Hx     Social History Social History  Substance Use Topics  . Smoking status: Never Smoker   . Smokeless tobacco: Never Used  . Alcohol Use: No    No Known Allergies  Current Outpatient Prescriptions  Medication Sig Dispense Refill  . acetaminophen (TYLENOL) 325 MG tablet Take 650 mg by mouth every 6 (six) hours as needed.    Marland Kitchen CIALIS 5 MG tablet TAKE 1 TABLET BY MOUTH EVERY DAY FOR BPH (Patient taking differently: TAKE 1 TABLET BY MOUTH EVERY DAY FOR BPH in pm) 30 tablet 12  . finasteride (PROSCAR) 5 MG tablet Take 1 tablet (5 mg total) by mouth daily. (Patient taking differently: Take 5 mg by mouth every evening. ) 30 tablet 11  . silodosin (RAPAFLO) 8 MG CAPS capsule Take 1 capsule (8 mg total) by mouth daily with breakfast. 30 capsule 11   No current facility-administered medications for this visit.    Review of Systems Review of Systems  Constitutional: Negative.   Respiratory: Negative.   Cardiovascular: Negative.   Gastrointestinal: Negative  for nausea, vomiting, diarrhea and constipation.    Blood pressure 120/72, pulse 62, resp. rate 12, height 6\' 3"  (1.905 m), weight 189 lb (85.73 kg).  Physical Exam Physical Exam  Constitutional: He is oriented to person, place, and time. He appears well-developed and well-nourished.  Abdominal: Soft. Normal appearance. There is no tenderness.    Repairs intact.  Neurological: He is alert and oriented to person, place, and time.  Skin: Skin is warm and dry.  Psychiatric: His behavior is normal.     Assessment    Doing well status post repair of bilateral inguinal hernias and open umbilical hernia  repair.    Plan        Follow up as needed. Proper lifting techniques reviewed. Resume activities as tolerated. The patient is aware to call back for any questions or concerns.   PCP:  Lelon Huh This information has been scribed by Gaspar Cola CMA.   Robert Bellow 05/23/2015, 8:28 PM

## 2015-05-22 NOTE — Patient Instructions (Addendum)
The patient is aware to call back for any questions or concerns.    Proper lifting techniques reviewed. Resume activities as tolerated. 

## 2015-05-23 ENCOUNTER — Ambulatory Visit (INDEPENDENT_AMBULATORY_CARE_PROVIDER_SITE_OTHER): Payer: BC Managed Care – PPO | Admitting: Urology

## 2015-05-23 ENCOUNTER — Encounter: Payer: Self-pay | Admitting: Urology

## 2015-05-23 VITALS — BP 117/74 | HR 67 | Ht 75.0 in | Wt 192.4 lb

## 2015-05-23 DIAGNOSIS — N529 Male erectile dysfunction, unspecified: Secondary | ICD-10-CM

## 2015-05-23 DIAGNOSIS — R350 Frequency of micturition: Secondary | ICD-10-CM

## 2015-05-23 DIAGNOSIS — R3129 Other microscopic hematuria: Secondary | ICD-10-CM

## 2015-05-23 DIAGNOSIS — Q6100 Congenital renal cyst, unspecified: Secondary | ICD-10-CM | POA: Diagnosis not present

## 2015-05-23 DIAGNOSIS — N486 Induration penis plastica: Secondary | ICD-10-CM

## 2015-05-23 DIAGNOSIS — R972 Elevated prostate specific antigen [PSA]: Secondary | ICD-10-CM | POA: Diagnosis not present

## 2015-05-23 DIAGNOSIS — N400 Enlarged prostate without lower urinary tract symptoms: Secondary | ICD-10-CM | POA: Diagnosis not present

## 2015-05-23 DIAGNOSIS — N281 Cyst of kidney, acquired: Secondary | ICD-10-CM

## 2015-05-23 LAB — URINALYSIS, COMPLETE
BILIRUBIN UA: NEGATIVE
Glucose, UA: NEGATIVE
Ketones, UA: NEGATIVE
Leukocytes, UA: NEGATIVE
Nitrite, UA: NEGATIVE
PH UA: 7.5 (ref 5.0–7.5)
Protein, UA: NEGATIVE
Specific Gravity, UA: 1.02 (ref 1.005–1.030)
UUROB: 0.2 mg/dL (ref 0.2–1.0)

## 2015-05-23 LAB — MICROSCOPIC EXAMINATION

## 2015-05-23 NOTE — Progress Notes (Signed)
05/23/2015 2:14 PM   [name] Dec 16, 1955 [id number]  Referring provider: [name], MD [address] [place], [place] [address]  Chief Complaint  Patient presents with  . Follow-up    BPH    HPI: The patient is a 60 year old male with a past medical history of Peyronie's disease status post plication by Dr. Karsten Ro in 2014 who presents with urinary frequency. The patient's IPSS score is 28. He has fours and fives every category except for nocturia which he rates his twice per night. He finds is very bothersome to his way of life. He is on Cialis 5 mg daily for erections and BPH. He feels it helps his erectile dysfunction but not his BPH. His PVR today is 11. He also was noted to have microscopic hematuria on her urinalysis today.   He had a PSA of 4.0 in 2015 and 3.9 in September 2016. PSA 4.2 in January 2017. He denied a negative prostate biopsy in February 2017.  PVR: 0 cc  CT Urogram showed 130 gm prostate. Though on ultrasound it was 93 g. It also showed a benign Bosniak 2 left upper pole cyst. Incidentally, it showed a hypodense lesion in the liver as well as a non-calcified 6 mm left lower lobe pulmonary nodule that requires further follow-up.   Cystoscopy was negative for source of hematuria. He did have a visually occlusive prostate with a median lobe.  IPSS score today is 9/2.Marland Kitchen He is very happy about the improvement in his urinary symptoms. He states they're much better than when he was first met. He is concerned by the retrograde ejaculation from Rapaflo.  The patient does note retrograde ejaculation symptoms since starting Rapaflo. He is not interested in intercourse at this time because he is having pain and bulging during intercourse. This appears to be from his bilateral directing no hernias. He states is getting worse. He is interested in surgical consultation for possible correction.   PMH: Past Medical History  Diagnosis Date  . Peyronie's  disease     W/ PENILE CURVATURE  . Frequency of urination   . Urgency of urination   . GERD (gastroesophageal reflux disease)   . BPH (benign prostatic hypertrophy)   . Benign neoplasm of ascending colon   . Esophagitis, reflux 09/29/2005  . Arthritis   . Degenerative disc disease, cervical   . DDD (degenerative disc disease), lumbar 01/18/2015    Surgical History: Past Surgical History  Procedure Laterality Date  . Nesbit procedure N/A 06/28/2012    Procedure: 16 DOT PLACTATION PROCEDURE;  Surgeon: Claybon Jabs, MD;  Location: [facility];  Service: Urology;  Laterality: N/A;  . Peyronies disease  06/2012  . Colonoscopy  06/2004    Done by Dr. Bary Castilla. No polyps  . Colonoscopy with propofol N/A 09/14/2014    Procedure: COLONOSCOPY WITH PROPOFOL;  Surgeon: Lucilla Lame, MD;  Location: [place];  Service: Endoscopy;  Laterality: N/A;  . Polypectomy  09/14/2014    Procedure: POLYPECTOMY;  Surgeon: Lucilla Lame, MD;  Location: [place];  Service: Endoscopy;;  . Prostate biopsy  03-02-15    neg  . Inguinal hernia repair Bilateral 04/09/2015    Procedure: LAPAROSCOPIC INGUINAL HERNIA;  Surgeon: Robert Bellow, MD;  Location: [facility];  Service: General;  Laterality: Bilateral;  . Umbilical hernia repair N/A 04/09/2015    Procedure: HERNIA REPAIR UMBILICAL ADULT;  Surgeon: Robert Bellow, MD;  Location: [facility];  Service: General;  Laterality: N/A;  . Hernia repair Bilateral 04/09/2015    Bard 3D Max mesh for bilateral inguinal hernias, primary repair of umbilical henria.     Home Medications:    Medication List       This list is accurate as of: 05/23/15  2:14 PM.  Always use your most recent med list.               acetaminophen 325 MG tablet  Commonly known as:  TYLENOL  Take 650 mg by mouth every 6 (six) hours as needed.     CIALIS 5 MG tablet  Generic drug:  tadalafil  TAKE 1 TABLET BY MOUTH EVERY DAY FOR BPH     finasteride 5 MG  tablet  Commonly known as:  PROSCAR  Take 1 tablet (5 mg total) by mouth daily.     silodosin 8 MG Caps capsule  Commonly known as:  RAPAFLO  Take 1 capsule (8 mg total) by mouth daily with breakfast.        Allergies: No Known Allergies  Family History: Family History  Problem Relation Age of Onset  . Prostate cancer Neg Hx   . Bladder Cancer Neg Hx   . Kidney cancer Neg Hx     Social History:  reports that he has never smoked. He has never used smokeless tobacco. He reports that he does not drink alcohol or use illicit drugs.  ROS: UROLOGY Frequent Urination?: No Hard to postpone urination?: No Burning/pain with urination?: No Get up at night to urinate?: Yes Leakage of urine?: No Urine stream starts and stops?: No Trouble starting stream?: No Do you have to strain to urinate?: No Blood in urine?: No Urinary tract infection?: No Sexually transmitted disease?: No Injury to kidneys or bladder?: No Painful intercourse?: No Weak stream?: No Erection problems?: Yes Penile pain?: No  Gastrointestinal Nausea?: No Vomiting?: No Indigestion/heartburn?: No Diarrhea?: No Constipation?: No  Constitutional Fever: No Night sweats?: No Weight loss?: No Fatigue?: No  Skin Skin rash/lesions?: No Itching?: No  Eyes Blurred vision?: No Double vision?: No  Ears/Nose/Throat Sore throat?: No Sinus problems?: No  Hematologic/Lymphatic Swollen glands?: No Easy bruising?: No  Cardiovascular Leg swelling?: No Chest pain?: No  Respiratory Cough?: No Shortness of breath?: No  Endocrine Excessive thirst?: No  Musculoskeletal Back pain?: No Joint pain?: No  Neurological Headaches?: No Dizziness?: No  Psychologic Depression?: No Anxiety?: No  Physical Exam: BP 117/74 mmHg  Pulse 67  Ht _0  (1.905 m)  Wt 192 lb 6.4 oz (87.272 kg)  BMI 24.05 kg/m2  Constitutional:  Alert and oriented, No acute distress. HEENT: [place] AT, moist mucus membranes.   Trachea midline, no masses. Cardiovascular: No clubbing, cyanosis, or edema. Respiratory: Normal respiratory effort, no increased work of breathing. GI: Abdomen is soft, nontender, nondistended, no abdominal masses GU: No CVA tenderness. DRE: 2+ smooth benign. Skin: No rashes, bruises or suspicious lesions. Lymph: No cervical or inguinal adenopathy. Neurologic: Grossly intact, no focal deficits, moving all 4 extremities. Psychiatric: Normal mood and affect.  Laboratory Data: Lab Results  Component Value Date   HGB 15.9 06/28/2012    Lab Results  Component Value Date   CREATININE 1.05 01/25/2015    Lab Results  Component Value Date   PSA 4.0 03/25/2013    No results found for: TESTOSTERONE  No results found for: HGBA1C  Urinalysis    Component Value Date/Time   APPEARANCEUR Clear 02/15/2015 0831   GLUCOSEU Negative 02/15/2015 0831   BILIRUBINUR  Negative 02/15/2015 0831   BILIRUBINUR Neg 01/17/2015 1410   PROTEINUR 1+* 02/15/2015 0831   PROTEINUR Neg 01/17/2015 1410   UROBILINOGEN 0.2 01/17/2015 1410   NITRITE Negative 02/15/2015 0831   NITRITE Neg 01/17/2015 1410   LEUKOCYTESUR Negative 02/15/2015 0831   LEUKOCYTESUR Negative 01/17/2015 1410     Assessment & Plan:    1. Asymptomatic microscopic hematuria -Negative hematuria work up. Follow up urinalysis in one year.  2. BPH with lower urinary tract symptoms (93 gm prostate) -Rapaflo 8 mg daily.At this time however, the patient would like to hold his Rapaflo due to the problems with retrograde ejaculation. He will try this. If his symptoms dramatically worsened. In the Rapaflo he will restart it.6 -Finasteride 5 mg daily   3. Erectile dysfunction Continue Cialis prn  4. Elevated PSA -negative biopsy in 02/2015 -check PSA today  5. Peyronie's disease. Stable status post plication.  6. Left upper pole Bosniak 2 benign cyst Benign lesion. No further workup necessary.   Return in about 6 months (around  11/23/2015).  Nickie Retort, MD  [facility] [address] [place], [place] [address] [phone]

## 2015-05-24 LAB — PSA: Prostate Specific Ag, Serum: 2.8 ng/mL (ref 0.0–4.0)

## 2015-05-28 ENCOUNTER — Telehealth: Payer: Self-pay

## 2015-05-28 NOTE — Telephone Encounter (Signed)
Spoke with pt in reference to PSA results. Pt voiced understanding. Pt was transferred to the front to make 17mo f/u appt.

## 2015-05-28 NOTE — Telephone Encounter (Signed)
-----   Message from Nickie Retort, MD sent at 05/24/2015  9:17 AM EDT ----- Please let patient know his PSA decreased to 2.8.  This is down from 4.2. I am happy that it came down as that is what I would expect to happen with finasteride treatment.

## 2015-11-22 ENCOUNTER — Telehealth: Payer: Self-pay | Admitting: Urology

## 2015-11-22 ENCOUNTER — Encounter: Payer: Self-pay | Admitting: Urology

## 2015-11-22 ENCOUNTER — Ambulatory Visit (INDEPENDENT_AMBULATORY_CARE_PROVIDER_SITE_OTHER): Payer: BC Managed Care – PPO | Admitting: Urology

## 2015-11-22 VITALS — BP 137/71 | HR 75 | Ht 75.0 in | Wt 196.0 lb

## 2015-11-22 DIAGNOSIS — N400 Enlarged prostate without lower urinary tract symptoms: Secondary | ICD-10-CM | POA: Diagnosis not present

## 2015-11-22 DIAGNOSIS — R972 Elevated prostate specific antigen [PSA]: Secondary | ICD-10-CM | POA: Diagnosis not present

## 2015-11-22 DIAGNOSIS — N5201 Erectile dysfunction due to arterial insufficiency: Secondary | ICD-10-CM | POA: Diagnosis not present

## 2015-11-22 DIAGNOSIS — R3129 Other microscopic hematuria: Secondary | ICD-10-CM | POA: Diagnosis not present

## 2015-11-22 LAB — URINALYSIS, COMPLETE
Bilirubin, UA: NEGATIVE
GLUCOSE, UA: NEGATIVE
KETONES UA: NEGATIVE
Leukocytes, UA: NEGATIVE
NITRITE UA: NEGATIVE
PROTEIN UA: NEGATIVE
SPEC GRAV UA: 1.025 (ref 1.005–1.030)
UUROB: 1 mg/dL (ref 0.2–1.0)
pH, UA: 6.5 (ref 5.0–7.5)

## 2015-11-22 LAB — MICROSCOPIC EXAMINATION
Bacteria, UA: NONE SEEN
Epithelial Cells (non renal): NONE SEEN /hpf (ref 0–10)
WBC UA: NONE SEEN /HPF (ref 0–?)

## 2015-11-22 MED ORDER — TADALAFIL 5 MG PO TABS: 5.0000 mg | ORAL_TABLET | Freq: Every day | ORAL | 11 refills | Status: DC | PRN

## 2015-11-22 MED ORDER — TADALAFIL 20 MG PO TABS: 20.0000 mg | ORAL_TABLET | Freq: Every day | ORAL | 11 refills | Status: DC | PRN

## 2015-11-22 NOTE — Telephone Encounter (Signed)
The pt spoke w/ Dr. Pilar Jarvis concerning his Cialis medication and was informed what to do.

## 2015-11-22 NOTE — Telephone Encounter (Signed)
Pt called and stated insurance company denied rx due to increase.  Cialis 5 mg was increased and they denied saying it needed prior authorization.  Please call appeals at [phone]

## 2015-11-22 NOTE — Addendum Note (Signed)
Addended by: Wilson Singer on: 11/22/2015 11:55 AM   Modules accepted: Orders

## 2015-11-22 NOTE — Progress Notes (Signed)
11/22/2015 11:35 AM   Micheal Mccoy [birth year] [id number]  Referring provider: [name], MD [address] [place], [place] [address]  Chief Complaint  Patient presents with  . Follow-up    HPI: The patient is a 60 year old male who presents for follow up.  1. Elevated PSA  He had a PSA of 4.0 in 2015 and 3.9 in September 2016. PSA 4.2 in January 2017. He had a negative prostate biopsy in February 2017. PSA was 2.8 in May 2017. He was started on finasteride between the two level checks.  2. Microhematuria Negative work up in January 2017 except for benign Bosniak 2 left upper pole cyst.   3. BPH On finasteride. IPSS: 9/2. Stopped Rapaflo at last visit due to retrograde ejaculation. No change in symptoms since stopping Rapaflo. Prostate: 93 gm on u/s Cystoscopy showed visually occlusive prostate with a median lobe.  4. ED On cialis 5 mg daily. He does not get a good enough erection to complete intercourse. Requesting a new medication  5. Peyronnie's Disease Stable s/p plication by Dr. Karsten Ro  The patient does note retrograde ejaculation symptoms since starting Rapaflo. He is not interested in intercourse at this time because he is having pain and bulging during intercourse. This appears to be from his bilateral directing no hernias. He states is getting worse. He is interested in surgical consultation for possible correction.     PMH: Past Medical History:  Diagnosis Date  . Arthritis   . Benign neoplasm of ascending colon   . BPH (benign prostatic hypertrophy)   . DDD (degenerative disc disease), lumbar 01/18/2015  . Degenerative disc disease, cervical   . Esophagitis, reflux 09/29/2005  . Frequency of urination   . GERD (gastroesophageal reflux disease)   . Peyronie's disease    W/ PENILE CURVATURE  . Urgency of urination     Surgical History: Past Surgical History:  Procedure Laterality Date  . COLONOSCOPY  06/2004   Done by Dr.  Bary Castilla. No polyps  . COLONOSCOPY WITH PROPOFOL N/A 09/14/2014   Procedure: COLONOSCOPY WITH PROPOFOL;  Surgeon: Lucilla Lame, MD;  Location: [place];  Service: Endoscopy;  Laterality: N/A;  . HERNIA REPAIR Bilateral 04/09/2015   Bard 3D Max mesh for bilateral inguinal hernias, primary repair of umbilical henria.   . INGUINAL HERNIA REPAIR Bilateral 04/09/2015   Procedure: LAPAROSCOPIC INGUINAL HERNIA;  Surgeon: Robert Bellow, MD;  Location: [facility];  Service: General;  Laterality: Bilateral;  . NESBIT PROCEDURE N/A 06/28/2012   Procedure: 16 DOT PLACTATION PROCEDURE;  Surgeon: Claybon Jabs, MD;  [facility];  Service: Urology;  Laterality: N/A;  . Peyronies Disease  06/2012  . POLYPECTOMY  09/14/2014   Procedure: POLYPECTOMY;  Surgeon: Lucilla Lame, MD;  Location: [place];  Service: Endoscopy;;  . PROSTATE BIOPSY  03-02-15   neg  . UMBILICAL HERNIA REPAIR N/A 04/09/2015   Procedure: HERNIA REPAIR UMBILICAL ADULT;  Surgeon: Robert Bellow, MD;  Location: [facility];  Service: General;  Laterality: N/A;    Home Medications:    Medication List       Accurate as of 11/22/15 11:35 AM. Always use your most recent med list.          acetaminophen 325 MG tablet Commonly known as:  TYLENOL Take 650 mg by mouth every 6 (six) hours as needed.   CIALIS 5 MG tablet Generic drug:  tadalafil TAKE 1 TABLET BY MOUTH EVERY DAY FOR  BPH   tadalafil 20 MG tablet Commonly known as:  CIALIS Take 1 tablet (20 mg total) by mouth daily as needed for erectile dysfunction.   finasteride 5 MG tablet Commonly known as:  PROSCAR Take 1 tablet (5 mg total) by mouth daily.   silodosin 8 MG Caps capsule Commonly known as:  RAPAFLO Take 1 capsule (8 mg total) by mouth daily with breakfast.       Allergies: No Known Allergies  Family History: Family History  Problem Relation Age of Onset  . Prostate cancer Neg Hx   . Bladder Cancer Neg Hx   . Kidney  cancer Neg Hx     Social History:  reports that he has never smoked. He has never used smokeless tobacco. He reports that he does not drink alcohol or use drugs.  ROS: UROLOGY Frequent Urination?: No Hard to postpone urination?: No Burning/pain with urination?: No Get up at night to urinate?: No Leakage of urine?: No Urine stream starts and stops?: Yes Trouble starting stream?: No Do you have to strain to urinate?: No Blood in urine?: No Urinary tract infection?: No Sexually transmitted disease?: No Injury to kidneys or bladder?: No Painful intercourse?: No Weak stream?: No Erection problems?: Yes Penile pain?: No  Gastrointestinal Nausea?: No Vomiting?: No Indigestion/heartburn?: No Diarrhea?: No Constipation?: No  Constitutional Fever: No Night sweats?: No Weight loss?: No Fatigue?: No  Skin Skin rash/lesions?: No Itching?: No  Eyes Blurred vision?: No Double vision?: No  Ears/Nose/Throat Sore throat?: No Sinus problems?: No  Hematologic/Lymphatic Swollen glands?: No Easy bruising?: No  Cardiovascular Leg swelling?: No Chest pain?: No  Respiratory Cough?: No Shortness of breath?: No  Endocrine Excessive thirst?: No  Musculoskeletal Back pain?: No Joint pain?: No  Neurological Headaches?: No Dizziness?: No  Psychologic Depression?: No Anxiety?: No  Physical Exam: BP 137/71 (BP Location: Left Arm, Patient Position: Sitting, Cuff Size: Normal)   Pulse 75   Ht 6\' 3"  (1.905 m)   Wt 196 lb (88.9 kg)   BMI 24.50 kg/m   Constitutional:  Alert and oriented, No acute distress. HEENT: [place] AT, moist mucus membranes.  Trachea midline, no masses. Cardiovascular: No clubbing, cyanosis, or edema. Respiratory: Normal respiratory effort, no increased work of breathing. GI: Abdomen is soft, nontender, nondistended, no abdominal masses GU: No CVA tenderness.  Skin: No rashes, bruises or suspicious lesions. Lymph: No cervical or inguinal  adenopathy. Neurologic: Grossly intact, no focal deficits, moving all 4 extremities. Psychiatric: Normal mood and affect.  Laboratory Data: Lab Results  Component Value Date   HGB 15.9 06/28/2012    Lab Results  Component Value Date   CREATININE 1.05 01/25/2015    Lab Results  Component Value Date   PSA 4.0 03/25/2013    No results found for: TESTOSTERONE  No results found for: HGBA1C  Urinalysis    Component Value Date/Time   APPEARANCEUR Cloudy (A) 05/23/2015 1359   GLUCOSEU Negative 05/23/2015 1359   BILIRUBINUR Negative 05/23/2015 1359   PROTEINUR Negative 05/23/2015 1359   UROBILINOGEN 0.2 01/17/2015 1410   NITRITE Negative 05/23/2015 1359   LEUKOCYTESUR Negative 05/23/2015 1359      Assessment & Plan:    1. Asymptomatic microscopic hematuria -Negative hematuria work up. Follow up urinalysis due in 6 months  2. BPH with lower urinary tract symptoms (93 gm prostate) -continue Finasteride 5 mg daily    3. Erectile dysfunction Will switch from cialis 5 mg daily to cialis 20 mg daily prn  4. Elevated PSA -  negative biopsy in 02/2015 -check PSA today (patient on Finasteride)  5. Peyronie's disease. Stable status post plication.  6. Left upper pole Bosniak 2 benign cyst Benign lesion. No further workup necessary.  Return in about 6 months (around 05/21/2016).  Nickie Retort, MD  [facility] [address], [place], [place] [address] [phone]

## 2015-11-22 NOTE — Addendum Note (Signed)
Addended by: Kerry Hough on: 11/22/2015 12:29 PM   Modules accepted: Orders

## 2015-11-23 LAB — PSA: PROSTATE SPECIFIC AG, SERUM: 2.3 ng/mL (ref 0.0–4.0)

## 2015-11-26 ENCOUNTER — Telehealth: Payer: Self-pay

## 2015-11-26 NOTE — Telephone Encounter (Signed)
Nickie Retort, MD  Wilson Singer, CMA        Please let patient know PSA is slightly decreased from last visit. Follow up as scheduled. thanks    The pt was notified of appt.

## 2015-12-28 ENCOUNTER — Ambulatory Visit (INDEPENDENT_AMBULATORY_CARE_PROVIDER_SITE_OTHER): Payer: BC Managed Care – PPO | Admitting: Family Medicine

## 2015-12-28 ENCOUNTER — Encounter: Payer: Self-pay | Admitting: Family Medicine

## 2015-12-28 VITALS — BP 130/80 | HR 66 | Temp 98.3°F | Resp 16 | Ht 75.0 in | Wt 199.0 lb

## 2015-12-28 DIAGNOSIS — H9193 Unspecified hearing loss, bilateral: Secondary | ICD-10-CM | POA: Diagnosis not present

## 2015-12-28 DIAGNOSIS — Z0000 Encounter for general adult medical examination without abnormal findings: Secondary | ICD-10-CM

## 2015-12-28 DIAGNOSIS — Z23 Encounter for immunization: Secondary | ICD-10-CM | POA: Diagnosis not present

## 2015-12-28 NOTE — Progress Notes (Signed)
Patient: Micheal Mccoy, Male    DOB: 05/20/1955, 60 y.o.   MRN: [id number] Visit Date: 12/28/2015  Today's Provider: Lelon Huh, MD   Chief Complaint  Patient presents with  . Annual Exam   Subjective:    Annual physical exam Micheal Mccoy is a 60 y.o. male who presents today for health maintenance and complete physical. He feels well. He reports exercising yes. He reports he is sleeping well. Complains of worsening hearing in both ears, worse on left for the last several months.  ----------------------------------------------------------------  BPH (benign prostatic hypertrophy) From 08/25/2015-no changes, refilled Cialis.    Review of Systems  Constitutional: Negative.   HENT: Positive for hearing loss and tinnitus.   Eyes: Positive for photophobia.  Respiratory: Negative.   Cardiovascular: Negative.   Gastrointestinal: Positive for anal bleeding.  Endocrine: Positive for polydipsia.  Genitourinary: Positive for urgency.  Musculoskeletal: Negative.   Skin: Negative.   Allergic/Immunologic: Negative.   Neurological: Negative.   Hematological: Negative.   Psychiatric/Behavioral: Negative.     Social History      He  reports that he has never smoked. He has never used smokeless tobacco. He reports that he does not drink alcohol or use drugs.       Social History   Social History  . Marital status: Married    Spouse name: N/A  . Number of children: N/A  . Years of education: N/A   Occupational History  . Associate Professor    Social History Main Topics  . Smoking status: Never Smoker  . Smokeless tobacco: Never Used  . Alcohol use No  . Drug use: No  . Sexual activity: Not Asked   Other Topics Concern  . None   Social History Narrative  . None    Past Medical History:  Diagnosis Date  . Arthritis   . Benign neoplasm of ascending colon   . BPH (benign prostatic hypertrophy)   . DDD (degenerative disc disease), lumbar 01/18/2015  .  Degenerative disc disease, cervical   . Esophagitis, reflux 09/29/2005  . Frequency of urination   . GERD (gastroesophageal reflux disease)   . Peyronie's disease    W/ PENILE CURVATURE  . Urgency of urination      Patient Active Problem List   Diagnosis Date Noted  . Bilateral inguinal hernia 04/09/2015  . Umbilical hernia without obstruction and without gangrene 02/23/2015  . Left inguinal hernia 02/23/2015  . Mccoy inguinal hernia 02/23/2015  . Prostate enlargement 02/23/2015  . DDD (degenerative disc disease), lumbar 01/18/2015  . Urinary frequency 01/17/2015  . Neck pain, chronic 01/17/2015  . History of adenomatous polyp of colon   . Arthritis 08/24/2014  . BPH (benign prostatic hypertrophy) 08/24/2014  . Erectile dysfunction 08/24/2014  . Peyronie's disease 06/28/2012  . Cervicalgia 07/12/2007  . Dizziness and giddiness 08/18/2006  . Esophagitis, reflux 09/29/2005  . Cervical spondylosis without myelopathy 01/06/1998    Past Surgical History:  Procedure Laterality Date  . COLONOSCOPY  06/2004   Done by Dr. Bary Castilla. No polyps  . COLONOSCOPY WITH PROPOFOL N/A 09/14/2014   Procedure: COLONOSCOPY WITH PROPOFOL;  Surgeon: Lucilla Lame, MD;  Location: [place];  Service: Endoscopy;  Laterality: N/A;  . HERNIA REPAIR Bilateral 04/09/2015   Bard 3D Max mesh for bilateral inguinal hernias, primary repair of umbilical henria.   . INGUINAL HERNIA REPAIR Bilateral 04/09/2015   Procedure: LAPAROSCOPIC INGUINAL HERNIA;  Surgeon: Robert Bellow, MD;  [facility]: [facility];  Service: General;  Laterality: Bilateral;  . NESBIT PROCEDURE N/A 06/28/2012   Procedure: 16 DOT PLACTATION PROCEDURE;  Surgeon: Claybon Jabs, MD;  Location: [facility];  Service: Urology;  Laterality: N/A;  . Peyronies Disease  06/2012  . POLYPECTOMY  09/14/2014   Procedure: POLYPECTOMY;  Surgeon: Lucilla Lame, MD;  Location: [place];  Service: Endoscopy;;  . PROSTATE BIOPSY   03-02-15   neg  . UMBILICAL HERNIA REPAIR N/A 04/09/2015   Procedure: HERNIA REPAIR UMBILICAL ADULT;  Surgeon: Robert Bellow, MD;  Location: [facility];  Service: General;  Laterality: N/A;    Family History        Family Status  Relation Status  . Mother Deceased at age 37   natural causes  . Father Deceased  . Sister Alive  . Brother Alive  . Daughter Alive  . Son Alive  . Brother Deceased   MVA  . Daughter Alive  . Neg Hx         His family history is not on file.     No Known Allergies   Current Outpatient Prescriptions:  .  acetaminophen (TYLENOL) 325 MG tablet, Take 650 mg by mouth every 6 (six) hours as needed., Disp: , Rfl:  .  CIALIS 5 MG tablet, TAKE 1 TABLET BY MOUTH EVERY DAY FOR BPH (Patient taking differently: TAKE 1 TABLET BY MOUTH EVERY DAY FOR BPH in pm), Disp: 30 tablet, Rfl: 12 .  finasteride (PROSCAR) 5 MG tablet, Take 1 tablet (5 mg total) by mouth daily. (Patient taking differently: Take 5 mg by mouth every evening. ), Disp: 30 tablet, Rfl: 11 .  silodosin (RAPAFLO) 8 MG CAPS capsule, Take 1 capsule (8 mg total) by mouth daily with breakfast., Disp: 30 capsule, Rfl: 11 .  tadalafil (CIALIS) 20 MG tablet, Take 1 tablet (20 mg total) by mouth daily as needed for erectile dysfunction., Disp: 6 tablet, Rfl: 11 .  tadalafil (CIALIS) 5 MG tablet, Take 1 tablet (5 mg total) by mouth daily as needed for erectile dysfunction., Disp: 30 tablet, Rfl: 11   Patient Care Team: [name], MD as PCP - General (Family Medicine) Robert Bellow, MD as Consulting Physician (General Surgery) Nickie Retort, MD as Consulting Physician (Urology)      Objective:   Vitals: BP 130/80 (BP Location: Mccoy Arm, Patient Position: Sitting, Cuff Size: Large)   Pulse 66   Temp 98.3 F (36.8 C) (Oral)   Resp 16   Ht 6\' 3"  (1.905 m)   Wt 199 lb (90.3 kg)   SpO2 97%   BMI 24.87 kg/m    Physical Exam   General Appearance:    Alert, cooperative, no distress,  appears stated age  Head:    Normocephalic, without obvious abnormality, atraumatic  Eyes:    PERRL, conjunctiva/corneas clear, EOM's intact, fundi    benign, both eyes       Ears:    Normal TM's and external ear canals, both ears  Nose:   Nares normal, septum midline, mucosa normal, no drainage   or sinus tenderness  Throat:   Lips, mucosa, and tongue normal; teeth and gums normal  Neck:   Supple, symmetrical, trachea midline, no adenopathy;       thyroid:  No enlargement/tenderness/nodules; no carotid   bruit or JVD  Back:     Symmetric, no curvature, ROM normal, no CVA tenderness  Lungs:     Clear to auscultation bilaterally, respirations  unlabored  Chest wall:    No tenderness or deformity  Heart:    Regular rate and rhythm, S1 and S2 normal, no murmur, rub   or gallop  Abdomen:     Soft, non-tender, bowel sounds active all four quadrants,    no masses, no organomegaly  Genitalia:    deferred  Rectal:    deferred  Extremities:   Extremities normal, atraumatic, no cyanosis or edema  Pulses:   2+ and symmetric all extremities  Skin:   Skin color, texture, turgor normal, no rashes or lesions  Lymph nodes:   Cervical, supraclavicular, and axillary nodes normal  Neurologic:   CNII-XII intact. Normal strength, sensation and reflexes      throughout    Depression Screen PHQ 2/9 Scores 12/28/2015 08/25/2014  PHQ - 2 Score 0 0  PHQ- 9 Score 0 0    Hearing Screening   125Hz  250Hz  500Hz  1000Hz  2000Hz  3000Hz  4000Hz  6000Hz  8000Hz   Mccoy ear:   40 40 25      Left ear:   25 25 25  25        Assessment & Plan:     Routine Health Maintenance and Physical Exam  Exercise Activities and Dietary recommendations Goals    None      Immunization History  Administered Date(s) Administered  . Tdap 03/22/2013    Health Maintenance  Topic Date Due  . Hepatitis C Screening  1955-04-03  . HIV Screening  08/05/1968  . ZOSTAVAX  08/05/2013  . INFLUENZA VACCINE  01/17/2016 (Originally  08/07/2015)  . COLONOSCOPY  09/14/2019  . TETANUS/TDAP  03/23/2023     Discussed health benefits of physical activity, and encouraged him to engage in regular exercise appropriate for his age and condition.    -------------------------------------------------------------------- 1. Annual physical exam  - Lipid panel - Comprehensive metabolic panel  2. Need for influenza vaccination  - Flu Vaccine QUAD 36+ mos IM  3. Hearing difficulty of both ears  - Ambulatory referral to ENT    Lelon Huh, MD  [facility]

## 2015-12-29 LAB — COMPREHENSIVE METABOLIC PANEL
A/G RATIO: 1.7 (ref 1.2–2.2)
ALBUMIN: 4.3 g/dL (ref 3.6–4.8)
ALT: 25 IU/L (ref 0–44)
AST: 26 IU/L (ref 0–40)
Alkaline Phosphatase: 54 IU/L (ref 39–117)
BILIRUBIN TOTAL: 0.6 mg/dL (ref 0.0–1.2)
BUN / CREAT RATIO: 11 (ref 10–24)
BUN: 10 mg/dL (ref 8–27)
CALCIUM: 9.5 mg/dL (ref 8.6–10.2)
CHLORIDE: 102 mmol/L (ref 96–106)
CO2: 28 mmol/L (ref 18–29)
Creatinine, Ser: 0.93 mg/dL (ref 0.76–1.27)
GFR, EST AFRICAN AMERICAN: 101 mL/min/{1.73_m2} (ref >59)
GFR, EST NON AFRICAN AMERICAN: 88 mL/min/{1.73_m2} (ref >59)
GLOBULIN, TOTAL: 2.6 g/dL (ref 1.5–4.5)
Glucose: 72 mg/dL (ref 65–99)
POTASSIUM: 4.4 mmol/L (ref 3.5–5.2)
Sodium: 142 mmol/L (ref 134–144)
TOTAL PROTEIN: 6.9 g/dL (ref 6.0–8.5)

## 2015-12-29 LAB — LIPID PANEL
CHOL/HDL RATIO: 3 ratio (ref 0.0–5.0)
CHOLESTEROL TOTAL: 161 mg/dL (ref 100–199)
HDL: 53 mg/dL (ref >39)
LDL Calculated: 97 mg/dL (ref 0–99)
TRIGLYCERIDES: 57 mg/dL (ref 0–149)
VLDL Cholesterol Cal: 11 mg/dL (ref 5–40)

## 2016-05-21 ENCOUNTER — Ambulatory Visit (INDEPENDENT_AMBULATORY_CARE_PROVIDER_SITE_OTHER): Payer: BC Managed Care – PPO | Admitting: Urology

## 2016-05-21 ENCOUNTER — Encounter: Payer: Self-pay | Admitting: Urology

## 2016-05-21 VITALS — BP 115/68 | HR 68 | Ht 75.0 in | Wt 193.8 lb

## 2016-05-21 DIAGNOSIS — R972 Elevated prostate specific antigen [PSA]: Secondary | ICD-10-CM | POA: Diagnosis not present

## 2016-05-21 DIAGNOSIS — R3129 Other microscopic hematuria: Secondary | ICD-10-CM

## 2016-05-21 DIAGNOSIS — N529 Male erectile dysfunction, unspecified: Secondary | ICD-10-CM | POA: Diagnosis not present

## 2016-05-21 DIAGNOSIS — N400 Enlarged prostate without lower urinary tract symptoms: Secondary | ICD-10-CM | POA: Diagnosis not present

## 2016-05-21 LAB — URINALYSIS, COMPLETE
Bilirubin, UA: NEGATIVE
Glucose, UA: NEGATIVE
Ketones, UA: NEGATIVE
Leukocytes, UA: NEGATIVE
NITRITE UA: NEGATIVE
PH UA: 6 (ref 5.0–7.5)
Protein, UA: NEGATIVE
Specific Gravity, UA: 1.025 (ref 1.005–1.030)
UUROB: 0.2 mg/dL (ref 0.2–1.0)

## 2016-05-21 LAB — MICROSCOPIC EXAMINATION: BACTERIA UA: NONE SEEN

## 2016-05-21 MED ORDER — SILDENAFIL CITRATE 20 MG PO TABS: ORAL_TABLET | ORAL | 3 refills | Status: DC

## 2016-05-21 NOTE — Progress Notes (Signed)
05/21/2016 2:26 PM   [name] 08-Mar-1955 [id number]  Referring provider: [name], [place], [place] [address]  Chief Complaint  Patient presents with  . Hematuria    Follow up    HPI: The patient is a 61 year old male who presents for follow up.  1. Elevated PSA  He had a PSA of 4.0 in 2015 and 3.9 in September 2016. PSA 4.2 in January 2017. He had a negative prostate biopsy in February 2017. PSA was 2.8 in May 2017. He was started on finasteride between the two level checks. It fell to 2.3 in November 2017. He has since stopped his finasteride.  2. Microhematuria Negative work up in January 2017 except for benign Bosniak 2 left upper pole cyst. His repeat urinalysis today is negative for red blood cells.  3. BPH Stopped all medications. IPPS is 8/2. Nocturia x1. Mild Feeling of incomplete bladder emptying and frequency. Overall mostly satisfied. He does not feel he needs medications. Prostate: 93 gm on u/s Cystoscopy showed visually occlusive prostate with a median lobe.  4. ED Was on cialis 5 mg daily which was increased to 20 mg at last visit, but his insurance would not pay for this medication. He is currently taking no medications for his erections. His erections have improved he is able to obtain and maintain erection that is softer than he would like and sometimes does not last long enough. He is interested in trying generic sildenafil.  5. Peyronnie's Disease Stable s/p plication by Dr. Karsten Ro     PMH: Past Medical History:  Diagnosis Date  . Arthritis   . Benign neoplasm of ascending colon   . Degenerative disc disease, cervical   . Esophagitis, reflux 09/29/2005  . Frequency of urination   . GERD (gastroesophageal reflux disease)   . Peyronie's disease    W/ PENILE CURVATURE    Surgical History: Past Surgical History:  Procedure Laterality Date  . COLONOSCOPY  06/2004   Done by Dr. Bary Castilla. No polyps  .  COLONOSCOPY WITH PROPOFOL N/A 09/14/2014   Procedure: COLONOSCOPY WITH PROPOFOL;  Surgeon: Lucilla Lame, MD;  Location: [place];  Service: Endoscopy;  Laterality: N/A;  . HERNIA REPAIR Bilateral 04/09/2015   Bard 3D Max mesh for bilateral inguinal hernias, primary repair of umbilical henria.   . INGUINAL HERNIA REPAIR Bilateral 04/09/2015   Procedure: LAPAROSCOPIC INGUINAL HERNIA;  Surgeon: Robert Bellow, MD;  Location: [facility];  Service: General;  Laterality: Bilateral;  . NESBIT PROCEDURE N/A 06/28/2012   Procedure: 16 DOT PLACTATION PROCEDURE;  Surgeon: Claybon Jabs, MD;  Location[facility];  Service: Urology;  Laterality: N/A;  . Peyronies Disease  06/2012  . POLYPECTOMY  09/14/2014   Procedure: POLYPECTOMY;  Surgeon: Lucilla Lame, MD;  Location: [place];  Service: Endoscopy;;  . PROSTATE BIOPSY  03-02-15   neg  . UMBILICAL HERNIA REPAIR N/A 04/09/2015   Procedure: HERNIA REPAIR UMBILICAL ADULT;  Surgeon: Robert Bellow, MD;  Location: [facility];  Service: General;  Laterality: N/A;    Home Medications:  Allergies as of 05/21/2016   No Known Allergies     Medication List       Accurate as of 05/21/16  2:26 PM. Always use your most recent med list.          acetaminophen 325 MG tablet Commonly known as:  TYLENOL Take 650 mg by mouth every 6 (six) hours as needed.  finasteride 5 MG tablet Commonly known as:  PROSCAR Take 1 tablet (5 mg total) by mouth daily.   multivitamin tablet Take 1 tablet by mouth daily.   sildenafil 20 MG tablet Commonly known as:  REVATIO Take 1 - 5 tabs PO daily prn   silodosin 8 MG Caps capsule Commonly known as:  RAPAFLO Take 1 capsule (8 mg total) by mouth daily with breakfast.   tadalafil 20 MG tablet Commonly known as:  CIALIS Take 1 tablet (20 mg total) by mouth daily as needed for erectile dysfunction.   tadalafil 5 MG tablet Commonly known as:  CIALIS Take 1 tablet (5 mg total) by mouth  daily as needed for erectile dysfunction.       Allergies: No Known Allergies  Family History: Family History  Problem Relation Age of Onset  . Prostate cancer Neg Hx   . Bladder Cancer Neg Hx   . Kidney cancer Neg Hx     Social History:  reports that he has never smoked. He has never used smokeless tobacco. He reports that he does not drink alcohol or use drugs.  ROS: UROLOGY Frequent Urination?: No Hard to postpone urination?: No Burning/pain with urination?: No Get up at night to urinate?: No Leakage of urine?: No Urine stream starts and stops?: No Trouble starting stream?: No Do you have to strain to urinate?: No Blood in urine?: No Urinary tract infection?: No Sexually transmitted disease?: No Injury to kidneys or bladder?: No Painful intercourse?: No Weak stream?: No Erection problems?: Yes Penile pain?: No  Gastrointestinal Nausea?: No Vomiting?: No Indigestion/heartburn?: No Diarrhea?: No Constipation?: No  Constitutional Fever: No Night sweats?: No Weight loss?: No Fatigue?: No  Skin Skin rash/lesions?: No Itching?: No  Eyes Blurred vision?: No Double vision?: No  Ears/Nose/Throat Sore throat?: No Sinus problems?: No  Hematologic/Lymphatic Swollen glands?: No Easy bruising?: No  Cardiovascular Leg swelling?: No Chest pain?: No  Respiratory Cough?: No Shortness of breath?: No  Endocrine Excessive thirst?: No  Musculoskeletal Back pain?: No Joint pain?: No  Neurological Headaches?: No Dizziness?: No  Psychologic Depression?: No Anxiety?: No  Physical Exam: BP 115/68 (BP Location: Left Arm, Patient Position: Sitting, Cuff Size: Normal)   Pulse 68   Ht 6\' 3"  (1.905 m)   Wt 193 lb 12.8 oz (87.9 kg)   BMI 24.22 kg/m   Constitutional:  Alert and oriented, No acute distress. HEENT: [place] AT, moist mucus membranes.  Trachea midline, no masses. Cardiovascular: No clubbing, cyanosis, or edema. Respiratory: Normal respiratory  effort, no increased work of breathing. GI: Abdomen is soft, nontender, nondistended, no abdominal masses GU: No CVA tenderness. DRE: 3+ benign Skin: No rashes, bruises or suspicious lesions. Lymph: No cervical or inguinal adenopathy. Neurologic: Grossly intact, no focal deficits, moving all 4 extremities. Psychiatric: Normal mood and affect.  Laboratory Data: Lab Results  Component Value Date   HGB 15.9 06/28/2012    Lab Results  Component Value Date   CREATININE 0.93 12/28/2015    Lab Results  Component Value Date   PSA 4.0 03/25/2013    No results found for: TESTOSTERONE  No results found for: HGBA1C  Urinalysis    Component Value Date/Time   APPEARANCEUR Clear 11/22/2015 1102   GLUCOSEU Negative 11/22/2015 1102   BILIRUBINUR Negative 11/22/2015 1102   PROTEINUR Negative 11/22/2015 1102   UROBILINOGEN 0.2 01/17/2015 1410   NITRITE Negative 11/22/2015 1102   LEUKOCYTESUR Negative 11/22/2015 1102    Assessment & Plan:    1. Asymptomatic  microscopic hematuria -Negative hematuria work up. Negative repeat urinalysis x1 today. We'll need to recheck urine again in 1 year.  2. BPH with lower urinary tract symptoms (93 gm prostate) -currently asymptomatic. No medications needed  3. Erectile dysfunction Will try generic sildenafil 1-5 tablets by mouth daily when necessary. He was warned of the risk of priapism and need for emergent intervention.  4. Elevated PSA -negative biopsy in 02/2015 -check PSA today (patient was on Finasteride when last checked and has now stopped)  5. Peyronie's disease. Stable status post plication.  6. Left upper pole Bosniak 2 benign cyst Benign lesion. No further workup necessary.   Return in about 6 months (around 11/21/2016) for PSA prior.  Nickie Retort, MD  [facility] [address] [place], [place] [address] [phone]

## 2016-05-22 LAB — PSA: Prostate Specific Ag, Serum: 3.3 ng/mL (ref 0.0–4.0)

## 2016-07-21 ENCOUNTER — Telehealth: Payer: Self-pay | Admitting: Urology

## 2016-07-21 DIAGNOSIS — R350 Frequency of micturition: Secondary | ICD-10-CM

## 2016-07-21 NOTE — Telephone Encounter (Signed)
Patient is asking for refills on Sildenafil and Rapaflo. He stated that he was told to just call back in if he wanted to have them refilled.  thanks

## 2016-07-23 MED ORDER — SILODOSIN 8 MG PO CAPS: 8.0000 mg | ORAL_CAPSULE | Freq: Every day | ORAL | 11 refills | Status: DC

## 2016-07-23 MED ORDER — SILDENAFIL CITRATE 20 MG PO TABS: ORAL_TABLET | ORAL | 3 refills | Status: DC

## 2016-07-23 NOTE — Telephone Encounter (Signed)
Patient notified RX is at pharmacy

## 2016-07-23 NOTE — Addendum Note (Signed)
Addended by: Kyra Manges on: 07/23/2016 09:31 AM   Modules accepted: Orders

## 2016-07-25 ENCOUNTER — Other Ambulatory Visit: Payer: Self-pay | Admitting: Radiology

## 2016-07-25 DIAGNOSIS — R350 Frequency of micturition: Secondary | ICD-10-CM

## 2016-07-25 MED ORDER — SILODOSIN 8 MG PO CAPS: 8.0000 mg | ORAL_CAPSULE | Freq: Every day | ORAL | 11 refills | Status: DC

## 2016-07-25 MED ORDER — FINASTERIDE 5 MG PO TABS: 5.0000 mg | ORAL_TABLET | Freq: Every day | ORAL | 11 refills | Status: DC

## 2016-11-17 ENCOUNTER — Other Ambulatory Visit: Payer: BC Managed Care – PPO

## 2016-11-17 DIAGNOSIS — R972 Elevated prostate specific antigen [PSA]: Secondary | ICD-10-CM

## 2016-11-18 LAB — PSA: PROSTATE SPECIFIC AG, SERUM: 4.3 ng/mL — AB (ref 0.0–4.0)

## 2016-11-20 ENCOUNTER — Ambulatory Visit (INDEPENDENT_AMBULATORY_CARE_PROVIDER_SITE_OTHER): Payer: BC Managed Care – PPO | Admitting: Urology

## 2016-11-20 ENCOUNTER — Encounter: Payer: Self-pay | Admitting: Urology

## 2016-11-20 VITALS — BP 120/76 | HR 72 | Ht 75.0 in | Wt 190.0 lb

## 2016-11-20 DIAGNOSIS — R972 Elevated prostate specific antigen [PSA]: Secondary | ICD-10-CM | POA: Diagnosis not present

## 2016-11-20 DIAGNOSIS — N400 Enlarged prostate without lower urinary tract symptoms: Secondary | ICD-10-CM

## 2016-11-20 DIAGNOSIS — N529 Male erectile dysfunction, unspecified: Secondary | ICD-10-CM | POA: Diagnosis not present

## 2016-11-20 MED ORDER — SILDENAFIL CITRATE 20 MG PO TABS: ORAL_TABLET | ORAL | 6 refills | Status: DC

## 2016-11-20 NOTE — Progress Notes (Signed)
11/20/2016 11:41 AM   [name] 1955/09/24 [id number]  Referring provider: [name], MD [address] [place], [place] [address]  Chief Complaint  Patient presents with  . Follow-up  . Benign Prostatic Hypertrophy  . Erectile Dysfunction  . Hematuria    HPI: The patient is a 61 year old male who presents for follow up.  1. Elevated PSA  He had a PSA of 4.0 in 2015 and 3.9 in September 2016. PSA 4.2 in January 2017. He hada negative prostate biopsy in February 2017. PSA was 2.8 in May 2017. He was started on finasteride between the two level checks. It fell to 2.3 in November 2017. He has since stopped his finasteride.  It  rose to 3.3 in May 2018.  It was 4.3 in November 2018 which was his baseline prior to starting finasteride.  DRE 3+ and benign in May 2018.  2. Microhematuria Negative work up in January 2017 except forbenign Bosniak 2 left upper pole cyst. His repeat urinalysis today is negative for red blood cells.  3. BPH Resume Rapaflo since his last visit.  However, is run out of this.  He is noting nocturia x2.  He has occasional intermittency.  He feels the Rapaflo improves his symptoms.  He is happy with his medication.  He plans to fill this prescription again as it is waiting his pharmacy  Prostate: 93 gm on u/s Cystoscopy showed visually occlusive prostate with a median lobe.  4. ED He has been on Cialis in the past however this was too expensive.  He is currently on sildenafil.  The tibial he takes 3 pills.  This was well for him with a good erection that lasted completion.  No issues of priapism.  No nitrates.  5. Peyronnie's Disease Stable s/p plication by Dr. Karsten Ro         PMH: Past Medical History:  Diagnosis Date  . Arthritis   . Benign neoplasm of ascending colon   . Degenerative disc disease, cervical   . Esophagitis, reflux 09/29/2005  . Frequency of urination   . GERD (gastroesophageal reflux disease)   .  Peyronie's disease    W/ PENILE CURVATURE    Surgical History: Past Surgical History:  Procedure Laterality Date  . COLONOSCOPY  06/2004   Done by Dr. Bary Castilla. No polyps  . COLONOSCOPY WITH PROPOFOL N/A 09/14/2014   Procedure: COLONOSCOPY WITH PROPOFOL;  Surgeon: Lucilla Lame, MD;  Location: [place];  Service: Endoscopy;  Laterality: N/A;  . HERNIA REPAIR Bilateral 04/09/2015   Bard 3D Max mesh for bilateral inguinal hernias, primary repair of umbilical henria.   . INGUINAL HERNIA REPAIR Bilateral 04/09/2015   Procedure: LAPAROSCOPIC INGUINAL HERNIA;  Surgeon: Robert Bellow, MD;  Location: [facility];  Service: General;  Laterality: Bilateral;  . NESBIT PROCEDURE N/A 06/28/2012   Procedure: 16 DOT PLACTATION PROCEDURE;  Surgeon: Claybon Jabs, MD;  Location: [facility];  Service: Urology;  Laterality: N/A;  . Peyronies Disease  06/2012  . POLYPECTOMY  09/14/2014   Procedure: POLYPECTOMY;  Surgeon: Lucilla Lame, MD;  Location: [facility];  Service: Endoscopy;;  . PROSTATE BIOPSY  03-02-15   neg  . UMBILICAL HERNIA REPAIR N/A 04/09/2015   Procedure: HERNIA REPAIR UMBILICAL ADULT;  Surgeon: Robert Bellow, MD;  Location: [facility];  Service: General;  Laterality: N/A;    Home Medications:  Allergies as of 11/20/2016   No Known Allergies     Medication List  Accurate as of 11/20/16 11:41 AM. Always use your most recent med list.          acetaminophen 325 MG tablet Commonly known as:  TYLENOL Take 650 mg by mouth every 6 (six) hours as needed.   finasteride 5 MG tablet Commonly known as:  PROSCAR Take 1 tablet (5 mg total) by mouth daily.   multivitamin tablet Take 1 tablet by mouth daily.   sildenafil 20 MG tablet Commonly known as:  REVATIO Take 1 - 5 tabs PO daily prn   sildenafil 20 MG tablet Commonly known as:  REVATIO Take 1 to 5 tabs PO daily prn   silodosin 8 MG Caps capsule Commonly known as:  RAPAFLO Take 1 capsule  (8 mg total) by mouth daily with breakfast.   tadalafil 20 MG tablet Commonly known as:  CIALIS Take 1 tablet (20 mg total) by mouth daily as needed for erectile dysfunction.   tadalafil 5 MG tablet Commonly known as:  CIALIS Take 1 tablet (5 mg total) by mouth daily as needed for erectile dysfunction.       Allergies: No Known Allergies  Family History: Family History  Problem Relation Age of Onset  . Prostate cancer Neg Hx   . Bladder Cancer Neg Hx   . Kidney cancer Neg Hx     Social History:  reports that  has never smoked. he has never used smokeless tobacco. He reports that he does not drink alcohol or use drugs.  ROS: UROLOGY Frequent Urination?: Yes Hard to postpone urination?: No Burning/pain with urination?: No Get up at night to urinate?: Yes Leakage of urine?: No Urine stream starts and stops?: Yes Trouble starting stream?: No Do you have to strain to urinate?: No Blood in urine?: No Urinary tract infection?: No Sexually transmitted disease?: No Injury to kidneys or bladder?: No Painful intercourse?: No Weak stream?: No Erection problems?: Yes Penile pain?: No  Gastrointestinal Nausea?: No Vomiting?: No Indigestion/heartburn?: No Diarrhea?: No Constipation?: No  Constitutional Fever: No Night sweats?: No Weight loss?: No Fatigue?: No  Skin Skin rash/lesions?: No Itching?: No  Eyes Blurred vision?: No Double vision?: No  Ears/Nose/Throat Sore throat?: No Sinus problems?: No  Hematologic/Lymphatic Swollen glands?: No Easy bruising?: No  Cardiovascular Leg swelling?: No Chest pain?: No  Respiratory Cough?: No Shortness of breath?: No  Endocrine Excessive thirst?: No  Musculoskeletal Back pain?: No Joint pain?: No  Neurological Headaches?: No Dizziness?: No  Psychologic Depression?: No Anxiety?: No  Physical Exam: BP 120/76   Pulse 72   Ht 6\' 3"  (1.905 m)   Wt 190 lb (86.2 kg)   BMI 23.75 kg/m     Constitutional:  Alert and oriented, No acute distress. HEENT: [place] AT, moist mucus membranes.  Trachea midline, no masses. Cardiovascular: No clubbing, cyanosis, or edema. Respiratory: Normal respiratory effort, no increased work of breathing. GI: Abdomen is soft, nontender, nondistended, no abdominal masses GU: No CVA tenderness.  Skin: No rashes, bruises or suspicious lesions. Lymph: No cervical or inguinal adenopathy. Neurologic: Grossly intact, no focal deficits, moving all 4 extremities. Psychiatric: Normal mood and affect.  Laboratory Data: Lab Results  Component Value Date   HGB 15.9 06/28/2012    Lab Results  Component Value Date   CREATININE 0.93 12/28/2015    Lab Results  Component Value Date   PSA 4.0 03/25/2013    No results found for: TESTOSTERONE  No results found for: HGBA1C  Urinalysis    Component Value Date/Time   APPEARANCEUR  Clear 05/21/2016 1353   GLUCOSEU Negative 05/21/2016 1353   BILIRUBINUR Negative 05/21/2016 1353   PROTEINUR Negative 05/21/2016 1353   UROBILINOGEN 0.2 01/17/2015 1410   NITRITE Negative 05/21/2016 1353   LEUKOCYTESUR Negative 05/21/2016 1353     Assessment & Plan:    1. Asymptomatic microscopic hematuria -Negative hematuria work up. Negative repeat urinalysis x1. We'll need to recheck urine at his next visit.  2. BPH with lower urinary tract symptoms (93 gm prostate) -currently on Rapaflo  3. Erectile dysfunction Continue sildenafil  4. Elevated PSA -negative biopsy in 02/2015 -PSA back to pre-finasteride level after stopping  finasteride over 6 months ago.  It is currently stable.  Repeat in 6 months with DRE.  5. Peyronie's disease. Stable status post plication.  6. Left upper pole Bosniak 2 benign cyst Benign lesion. No further workup necessary.    Return in about 6 months (around 05/20/2017) for psa prior.  Nickie Retort, MD  [facility] [address] [place], [place] [address] [phone]

## 2017-05-19 ENCOUNTER — Other Ambulatory Visit: Payer: Self-pay | Admitting: Family Medicine

## 2017-05-19 DIAGNOSIS — R972 Elevated prostate specific antigen [PSA]: Secondary | ICD-10-CM

## 2017-05-20 ENCOUNTER — Other Ambulatory Visit: Payer: BC Managed Care – PPO

## 2017-05-21 ENCOUNTER — Other Ambulatory Visit: Payer: BC Managed Care – PPO

## 2017-05-21 DIAGNOSIS — R972 Elevated prostate specific antigen [PSA]: Secondary | ICD-10-CM

## 2017-05-22 ENCOUNTER — Encounter: Payer: Self-pay | Admitting: Urology

## 2017-05-22 ENCOUNTER — Ambulatory Visit (INDEPENDENT_AMBULATORY_CARE_PROVIDER_SITE_OTHER): Payer: BC Managed Care – PPO | Admitting: Urology

## 2017-05-22 DIAGNOSIS — N529 Male erectile dysfunction, unspecified: Secondary | ICD-10-CM | POA: Diagnosis not present

## 2017-05-22 DIAGNOSIS — R972 Elevated prostate specific antigen [PSA]: Secondary | ICD-10-CM

## 2017-05-22 DIAGNOSIS — N400 Enlarged prostate without lower urinary tract symptoms: Secondary | ICD-10-CM | POA: Diagnosis not present

## 2017-05-22 LAB — PSA: PROSTATE SPECIFIC AG, SERUM: 3.9 ng/mL (ref 0.0–4.0)

## 2017-05-22 NOTE — Progress Notes (Signed)
05/22/2017 11:27 AM   [name] 11/04/1955 [id number]  Referring provider: [name], [place], [place] [address]  No chief complaint on file.   HPI: The patient is a 62 year old male who presents for follow up.  1. Elevated PSA  He had a PSA of 4.0 in 2015 and 3.9 in September 2016. PSA was 4.2 in January 2017. He hada negative prostate biopsy in February 2017. PSA was 2.8 in May 2017. He was started on finasteride between the two level checks.It fell to 2.3 in November 2017.He then stopped his finasteride.  It rose to 3.3 in May 2018.  It was 4.3 in November 2018 which was his baseline prior to starting finasteride.  PSA was 1.9 in May 2019.  2. Microhematuria Negative work up in January 2017 except forbenign Bosniak 2 left upper pole cyst.Repeat urinalysis was negative for red blood cells.  3. BPH Patient was previously on Rapaflo for his BPH symptoms.  He stopped this medication as he was bothered by the retrograde ejaculation.  He notes that he has nocturia once or twice per night.  He has a good stream.  He feels he empties his bladder but not his well as he did well on Rapaflo.  He also has occasional urgency.  He is not interested in any medications to improve his symptoms at this time.   Prostate: 93 gm on u/s Cystoscopy showed visually occlusive prostate with a median lobe.  4. ED He has been on Cialis in the past however this was too expensive.  He is currently on sildenafil.  He usually takes 3 pills.  This works well for him with a good erection that lasts completion to the completion of intercourse. No nitrates.  5. Peyronnie's Disease Stable s/p plication by Dr. Karsten Ro   PMH: Past Medical History:  Diagnosis Date  . Arthritis   . Benign neoplasm of ascending colon   . Degenerative disc disease, cervical   . Esophagitis, reflux 09/29/2005  . Frequency of urination   . GERD (gastroesophageal reflux disease)    . Peyronie's disease    W/ PENILE CURVATURE    Surgical History: Past Surgical History:  Procedure Laterality Date  . COLONOSCOPY  06/2004   Done by Dr. Bary Castilla. No polyps  . COLONOSCOPY WITH PROPOFOL N/A 09/14/2014   Procedure: COLONOSCOPY WITH PROPOFOL;  Surgeon: Lucilla Lame, MD;  Location: [facility];  Service: Endoscopy;  Laterality: N/A;  . HERNIA REPAIR Bilateral 04/09/2015   Bard 3D Max mesh for bilateral inguinal hernias, primary repair of umbilical henria.   . INGUINAL HERNIA REPAIR Bilateral 04/09/2015   Procedure: LAPAROSCOPIC INGUINAL HERNIA;  Surgeon: Robert Bellow, MD;  Location: [facility];  Service: General;  Laterality: Bilateral;  . NESBIT PROCEDURE N/A 06/28/2012   Procedure: 16 DOT PLACTATION PROCEDURE;  Surgeon: Claybon Jabs, MD;  Location: [facility];  Service: Urology;  Laterality: N/A;  . Peyronies Disease  06/2012  . POLYPECTOMY  09/14/2014   Procedure: POLYPECTOMY;  Surgeon: Lucilla Lame, MD;  Location: [facility];  Service: Endoscopy;;  . PROSTATE BIOPSY  03-02-15   neg  . UMBILICAL HERNIA REPAIR N/A 04/09/2015   Procedure: HERNIA REPAIR UMBILICAL ADULT;  Surgeon: Robert Bellow, MD;  Location: [facility];  Service: General;  Laterality: N/A;    Home Medications:  Allergies as of 05/22/2017   No Known Allergies     Medication List  Accurate as of 05/22/17 11:27 AM. Always use your most recent med list.          acetaminophen 325 MG tablet Commonly known as:  TYLENOL Take 650 mg by mouth every 6 (six) hours as needed.   multivitamin tablet Take 1 tablet by mouth daily.   sildenafil 20 MG tablet Commonly known as:  REVATIO Take 1 - 5 tabs PO daily prn       Allergies: No Known Allergies  Family History: Family History  Problem Relation Age of Onset  . Prostate cancer Neg Hx   . Bladder Cancer Neg Hx   . Kidney cancer Neg Hx     Social History:  reports that he has never smoked. He has never used  smokeless tobacco. He reports that he does not drink alcohol or use drugs.  ROS: UROLOGY Frequent Urination?: Yes Hard to postpone urination?: No Burning/pain with urination?: No Get up at night to urinate?: Yes Leakage of urine?: No Urine stream starts and stops?: Yes Trouble starting stream?: No Do you have to strain to urinate?: No Blood in urine?: No Urinary tract infection?: No Sexually transmitted disease?: No Injury to kidneys or bladder?: No Painful intercourse?: No Weak stream?: No Erection problems?: No Penile pain?: No  Gastrointestinal Nausea?: No Vomiting?: No Indigestion/heartburn?: No Diarrhea?: No Constipation?: No  Constitutional Fever: No Night sweats?: No Weight loss?: No Fatigue?: No  Skin Skin rash/lesions?: No Itching?: No  Eyes Blurred vision?: No Double vision?: No  Ears/Nose/Throat Sore throat?: No Sinus problems?: No  Hematologic/Lymphatic Swollen glands?: No Easy bruising?: No  Cardiovascular Leg swelling?: No Chest pain?: No  Respiratory Cough?: No Shortness of breath?: No  Endocrine Excessive thirst?: No  Musculoskeletal Back pain?: No Joint pain?: No  Neurological Headaches?: No Dizziness?: No  Psychologic Depression?: No Anxiety?: No  Physical Exam: There were no vitals taken for this visit.  Constitutional:  Alert and oriented, No acute distress. HEENT: [place] AT, moist mucus membranes.  Trachea midline, no masses. Cardiovascular: No clubbing, cyanosis, or edema. Respiratory: Normal respiratory effort, no increased work of breathing. GI: Abdomen is soft, nontender, nondistended, no abdominal masses GU: No CVA tenderness.  DRE.  80 g smooth benign. Skin: No rashes, bruises or suspicious lesions. Lymph: No cervical or inguinal adenopathy. Neurologic: Grossly intact, no focal deficits, moving all 4 extremities. Psychiatric: Normal mood and affect.  Laboratory Data: Lab Results  Component Value Date   HGB  15.9 06/28/2012    Lab Results  Component Value Date   CREATININE 0.93 12/28/2015    Lab Results  Component Value Date   PSA 4.0 03/25/2013    No results found for: TESTOSTERONE  No results found for: HGBA1C  Urinalysis    Component Value Date/Time   APPEARANCEUR Clear 05/21/2016 1353   GLUCOSEU Negative 05/21/2016 1353   BILIRUBINUR Negative 05/21/2016 1353   PROTEINUR Negative 05/21/2016 1353   UROBILINOGEN 0.2 01/17/2015 1410   NITRITE Negative 05/21/2016 1353   LEUKOCYTESUR Negative 05/21/2016 1353    Assessment & Plan:    1. BPH with lower urinary tract symptoms (93 gm prostate) - Will treat conservatively as the patient finds a medication more bothersome than the symptoms.  2. Erectile dysfunction Continue sildenafil prn  3. Elevated PSA -negative biopsy in 02/2015 -PSA is stable and slightly lower than it was 6 months ago.  Since it is now in the technically normal range, we will check it one more time in 6 months.  If it remains within  this range at his next appointment, we can consider transitioning him to annual PSA screening.  Return in about 6 months (around 11/22/2017) for PSA prior.  Nickie Retort, MD  [facility] [address], [place], [place] [address] [phone]

## 2017-06-24 ENCOUNTER — Other Ambulatory Visit: Payer: Self-pay | Admitting: Family Medicine

## 2017-06-24 MED ORDER — SILODOSIN 8 MG PO CAPS: 8.0000 mg | ORAL_CAPSULE | Freq: Every day | ORAL | 11 refills | Status: DC

## 2017-07-26 ENCOUNTER — Emergency Department
Admission: EM | Admit: 2017-07-26 | Discharge: 2017-07-26 | Disposition: A | Discharge status: Home / Self Care | Payer: BC Managed Care – PPO | Attending: Student in an Organized Health Care Education/Training Program | Admitting: Student in an Organized Health Care Education/Training Program

## 2017-07-26 ENCOUNTER — Emergency Department: Payer: BC Managed Care – PPO

## 2017-07-26 ENCOUNTER — Encounter: Payer: Self-pay | Admitting: Emergency Medicine

## 2017-07-26 ENCOUNTER — Other Ambulatory Visit: Payer: Self-pay

## 2017-07-26 DIAGNOSIS — M5412 Radiculopathy, cervical region: Secondary | ICD-10-CM

## 2017-07-26 DIAGNOSIS — Z79899 Other long term (current) drug therapy: Secondary | ICD-10-CM | POA: Diagnosis not present

## 2017-07-26 DIAGNOSIS — R202 Paresthesia of skin: Secondary | ICD-10-CM

## 2017-07-26 DIAGNOSIS — M542 Cervicalgia: Secondary | ICD-10-CM | POA: Insufficient documentation

## 2017-07-26 DIAGNOSIS — R200 Anesthesia of skin: Secondary | ICD-10-CM | POA: Diagnosis present

## 2017-07-26 LAB — CBC WITH DIFFERENTIAL/PLATELET
Basophils Absolute: 0 10*3/uL (ref 0–0.1)
Basophils Relative: 1 %
Eosinophils Absolute: 0.1 10*3/uL (ref 0–0.7)
Eosinophils Relative: 1 %
HCT: 44.8 % (ref 40.0–52.0)
Hemoglobin: 15.1 g/dL (ref 13.0–18.0)
Lymphocytes Relative: 28 %
Lymphs Abs: 1.3 10*3/uL (ref 1.0–3.6)
MCH: 27.4 pg (ref 26.0–34.0)
MCHC: 33.8 g/dL (ref 32.0–36.0)
MCV: 81.2 fL (ref 80.0–100.0)
Monocytes Absolute: 0.6 10*3/uL (ref 0.2–1.0)
Monocytes Relative: 12 %
NEUTROS ABS: 2.7 10*3/uL (ref 1.4–6.5)
NEUTROS PCT: 58 %
Platelets: 184 10*3/uL (ref 150–440)
RBC: 5.52 MIL/uL (ref 4.40–5.90)
RDW: 14.4 % (ref 11.5–14.5)
WBC: 4.8 10*3/uL (ref 3.8–10.6)

## 2017-07-26 LAB — TROPONIN I: Troponin I: <0.03 ng/mL (ref <0.03)

## 2017-07-26 LAB — BASIC METABOLIC PANEL
ANION GAP: 5 (ref 5–15)
BUN: 14 mg/dL (ref 8–23)
CHLORIDE: 109 mmol/L (ref 98–111)
CO2: 26 mmol/L (ref 22–32)
Calcium: 8.7 mg/dL — ABNORMAL LOW (ref 8.9–10.3)
Creatinine, Ser: 0.83 mg/dL (ref 0.61–1.24)
GFR calc non Af Amer: >60 mL/min (ref >60)
Glucose, Bld: 95 mg/dL (ref 70–99)
POTASSIUM: 3.8 mmol/L (ref 3.5–5.1)
Sodium: 140 mmol/L (ref 135–145)

## 2017-07-26 MED ORDER — LIDOCAINE 5 % EX PTCH
1.0000 | MEDICATED_PATCH | CUTANEOUS | Status: DC
  Administered 2017-07-26: 1 via TRANSDERMAL
  Filled 2017-07-26: qty 1

## 2017-07-26 MED ORDER — NAPROXEN 500 MG PO TABS: 500.0000 mg | ORAL_TABLET | Freq: Two times a day (BID) | ORAL | 0 refills | Status: DC

## 2017-07-26 MED ORDER — HYDROCODONE-ACETAMINOPHEN 5-325 MG PO TABS: 1.0000 | ORAL_TABLET | ORAL | 0 refills | Status: DC | PRN

## 2017-07-26 MED ORDER — ONDANSETRON HCL 4 MG/2ML IJ SOLN
4.0000 mg | Freq: Once | INTRAMUSCULAR | Status: AC
  Administered 2017-07-26: 4 mg via INTRAVENOUS
  Filled 2017-07-26: qty 2

## 2017-07-26 MED ORDER — LIDOCAINE 5 % EX PTCH: 1.0000 | MEDICATED_PATCH | Freq: Two times a day (BID) | CUTANEOUS | 0 refills | Status: DC

## 2017-07-26 MED ORDER — NAPROXEN 500 MG PO TABS
500.0000 mg | ORAL_TABLET | Freq: Once | ORAL | Status: AC
  Administered 2017-07-26: 500 mg via ORAL
  Filled 2017-07-26: qty 1

## 2017-07-26 MED ORDER — MORPHINE SULFATE (PF) 4 MG/ML IV SOLN
4.0000 mg | INTRAVENOUS | Status: DC | PRN
  Administered 2017-07-26: 4 mg via INTRAVENOUS
  Filled 2017-07-26: qty 1

## 2017-07-26 NOTE — ED Notes (Addendum)
Report to Susan, RN  

## 2017-07-26 NOTE — ED Notes (Signed)
Patient transported to X-ray 

## 2017-07-26 NOTE — ED Notes (Signed)
Placed on 2L [place], oxygen 88% on RA.   94% on 2L [place].

## 2017-07-26 NOTE — ED Provider Notes (Signed)
[facility] Emergency Department Provider Note    First MD Initiated Contact with Patient 07/26/17 0830     (approximate)  I have reviewed the triage vital signs and the nursing notes.   HISTORY  Chief Complaint Numbness (RIGHT arm)    HPI Micheal Mccoy is a 62 y.o. male with a history of degenerative disc disease of cervical spine presents with worsening right sided neck pain radiating down the right posterior shoulder and into his right arm.  Symptoms started roughly 1 week ago when he is traveling [place].  Denies any specific injury.  No heavy lifting.  No fevers.  States he is having new development of numbness on the right anterior forearm down to the wrist from the right elbow as well as weakness of the right upper extremity.  Symptoms are worsened when he looks to the left.  States that when he does this it feels like electricity shooting down his arm.  The pain is mild to moderate in severity.    Past Medical History:  Diagnosis Date  . Arthritis   . Benign neoplasm of ascending colon   . Degenerative disc disease, cervical   . Esophagitis, reflux 09/29/2005  . Frequency of urination   . GERD (gastroesophageal reflux disease)   . Peyronie's disease    W/ PENILE CURVATURE   Family History  Problem Relation Age of Onset  . Prostate cancer Neg Hx   . Bladder Cancer Neg Hx   . Kidney cancer Neg Hx    Past Surgical History:  Procedure Laterality Date  . COLONOSCOPY  06/2004   Done by Dr. Bary Castilla. No polyps  . COLONOSCOPY WITH PROPOFOL N/A 09/14/2014   Procedure: COLONOSCOPY WITH PROPOFOL;  Surgeon: Lucilla Lame, MD;  Location: [facility];  Service: Endoscopy;  Laterality: N/A;  . HERNIA REPAIR Bilateral 04/09/2015   Bard 3D Max mesh for bilateral inguinal hernias, primary repair of umbilical henria.   . INGUINAL HERNIA REPAIR Bilateral 04/09/2015   Procedure: LAPAROSCOPIC INGUINAL HERNIA;  Surgeon: Robert Bellow, MD;  Location:  [facility];  Service: General;  Laterality: Bilateral;  . NESBIT PROCEDURE N/A 06/28/2012   Procedure: 16 DOT PLACTATION PROCEDURE;  Surgeon: Claybon Jabs, MD;  Location: [facility];  Service: Urology;  Laterality: N/A;  . Peyronies Disease  06/2012  . POLYPECTOMY  09/14/2014   Procedure: POLYPECTOMY;  Surgeon: Lucilla Lame, MD;  Location: [facility];  Service: Endoscopy;;  . PROSTATE BIOPSY  03-02-15   neg  . UMBILICAL HERNIA REPAIR N/A 04/09/2015   Procedure: HERNIA REPAIR UMBILICAL ADULT;  Surgeon: Robert Bellow, MD;  Location: [facility];  Service: General;  Laterality: N/A;   Patient Active Problem List   Diagnosis Date Noted  . Prostate enlargement 02/23/2015  . DDD (degenerative disc disease), lumbar 01/18/2015  . Urinary frequency 01/17/2015  . Neck pain, chronic 01/17/2015  . History of adenomatous polyp of colon   . Arthritis 08/24/2014  . BPH (benign prostatic hypertrophy) 08/24/2014  . Erectile dysfunction 08/24/2014  . Peyronie's disease 06/28/2012  . Cervicalgia 07/12/2007  . Dizziness and giddiness 08/18/2006  . Esophagitis, reflux 09/29/2005  . Cervical spondylosis without myelopathy 01/06/1998      Prior to Admission medications   Medication Sig Start Date End Date Taking? Authorizing Provider  acetaminophen (TYLENOL) 325 MG tablet Take 650 mg by mouth every 6 (six) hours as needed.    [provider]  HYDROcodone-acetaminophen (NORCO) 5-325 MG tablet Take  1 tablet by mouth every 4 (four) hours as needed for moderate pain. 07/26/17   Merlyn Lot, MD  lidocaine (LIDODERM) 5 % Place 1 patch onto the skin every 12 (twelve) hours. Remove & Discard patch within 12 hours or as directed by MD 07/26/17 07/26/18  Merlyn Lot, MD  Multiple Vitamin (MULTIVITAMIN) tablet Take 1 tablet by mouth daily.    [provider]  naproxen (NAPROSYN) 500 MG tablet Take 1 tablet (500 mg total) by mouth 2 (two) times daily with a meal.  07/26/17 07/26/18  Merlyn Lot, MD  sildenafil (REVATIO) 20 MG tablet Take 1 - 5 tabs PO daily prn 07/23/16   Nickie Retort, MD  silodosin (RAPAFLO) 8 MG CAPS capsule Take 1 capsule (8 mg total) by mouth daily with breakfast. 06/24/17   Nickie Retort, MD    Allergies Patient has no known allergies.    Social History Social History   Tobacco Use  . Smoking status: Never Smoker  . Smokeless tobacco: Never Used  Substance Use Topics  . Alcohol use: No  . Drug use: No    Review of Systems Patient denies headaches, rhinorrhea, blurry vision, numbness, shortness of breath, chest pain, edema, cough, abdominal pain, nausea, vomiting, diarrhea, dysuria, fevers, rashes or hallucinations unless otherwise stated above in HPI. ____________________________________________   PHYSICAL EXAM:  VITAL SIGNS: Vitals:   07/26/17 1130 07/26/17 1200  BP: (!) 135/100 135/82  Pulse: 60 (!) 58  Resp: 13 (!) 21  Temp:    SpO2: 100% 100%    Constitutional: Alert and oriented.  Eyes: Conjunctivae are normal.  Head: Atraumatic. Nose: No congestion/rhinnorhea. Mouth/Throat: Mucous membranes are moist.   Neck: No stridor. Painless ROM.  Cardiovascular: Normal rate, regular rhythm. Grossly normal heart sounds.  Good peripheral circulation. Respiratory: Normal respiratory effort.  No retractions. Lungs CTAB. Gastrointestinal: Soft and nontender. No distention. No abdominal bruits. No CVA tenderness. Genitourinary:  Musculoskeletal: No lower extremity tenderness nor edema.  No joint effusions. Neurologic:  Normal speech and language. Decreased sensation to pain and light touch on right anterior forearm from elbow to wrist, decreased grip strenght RUE 4+ tricep, 5/5 bicep. No facial droop Skin:  Skin is warm, dry and intact. No rash noted. Psychiatric: Mood and affect are normal. Speech and behavior are normal.  ____________________________________________   LABS (all labs ordered  are listed, but only abnormal results are displayed)  Results for orders placed or performed during the hospital encounter of 07/26/17 (from the past 24 hour(s))  CBC with Differential/Platelet     Status: None   Collection Time: 07/26/17  8:51 AM  Result Value Ref Range   WBC 4.8 3.8 - 10.6 K/uL   RBC 5.52 4.40 - 5.90 MIL/uL   Hemoglobin 15.1 13.0 - 18.0 g/dL   HCT 44.8 40.0 - 52.0 %   MCV 81.2 80.0 - 100.0 fL   MCH 27.4 26.0 - 34.0 pg   MCHC 33.8 32.0 - 36.0 g/dL   RDW 14.4 11.5 - 14.5 %   Platelets 184 150 - 440 K/uL   Neutrophils Relative % 58 %   Neutro Abs 2.7 1.4 - 6.5 K/uL   Lymphocytes Relative 28 %   Lymphs Abs 1.3 1.0 - 3.6 K/uL   Monocytes Relative 12 %   Monocytes Absolute 0.6 0.2 - 1.0 K/uL   Eosinophils Relative 1 %   Eosinophils Absolute 0.1 0 - 0.7 K/uL   Basophils Relative 1 %   Basophils Absolute 0.0 0 - 0.1  K/uL  Basic metabolic panel     Status: Abnormal   Collection Time: 07/26/17  8:51 AM  Result Value Ref Range   Sodium 140 135 - 145 mmol/L   Potassium 3.8 3.5 - 5.1 mmol/L   Chloride 109 98 - 111 mmol/L   CO2 26 22 - 32 mmol/L   Glucose, Bld 95 70 - 99 mg/dL   BUN 14 8 - 23 mg/dL   Creatinine, Ser 0.83 0.61 - 1.24 mg/dL   Calcium 8.7 (L) 8.9 - 10.3 mg/dL   GFR calc non Af Amer >60 >60 mL/min   GFR calc Af Amer >60 >60 mL/min   Anion gap 5 5 - 15  Troponin I     Status: None   Collection Time: 07/26/17  8:51 AM  Result Value Ref Range   Troponin I <0.03 <0.03 ng/mL   ____________________________________________  EKG My review and personal interpretation at Time: 10:58   Indication: shoulder pain  Rate: 60  Rhythm: sinus Axis: normal Other: normal intervals, no stemi ____________________________________________  RADIOLOGY  I personally reviewed all radiographic images ordered to evaluate for the above acute complaints and reviewed radiology reports and findings.  These findings were personally discussed with the patient.  Please see medical  record for radiology report.  ____________________________________________   PROCEDURES  Procedure(s) performed:  Procedures    Critical Care performed: no ____________________________________________   INITIAL IMPRESSION / ASSESSMENT AND PLAN / ED COURSE  Pertinent labs & imaging results that were available during my care of the patient were reviewed by me and considered in my medical decision making (see chart for details).   DDX: radiculopathy, cord impingement, torticollis, fracture  AIJALON KIRTZ is a 62 y.o. who presents to the ED with symptoms as described above based on the patient's history of degenerative disc disease with neuro weakness x-ray ordered to evaluate for fracture which is not and therefore MRI ordered to evaluate for cord impingement.  Blood work is reassuring.  No evidence of ACS.  No masses.  Patient observed in the ER once pain was controlled had improved sensation.  Do suspect some component of radiculopathy with muscle spasm worsening symptoms.  Not clinically consistent with dissection.  Patient pain-free and I do believe he stable and appropriate for outpatient follow-up.      As part of my medical decision making, I reviewed the following data within the [facility] notes reviewed and incorporated, Labs reviewed, notes from prior ED visits.   ____________________________________________   FINAL CLINICAL IMPRESSION(S) / ED DIAGNOSES  Final diagnoses:  Paresthesia  Cervical radiculopathy      NEW MEDICATIONS STARTED DURING THIS VISIT:  New Prescriptions   HYDROCODONE-ACETAMINOPHEN (NORCO) 5-325 MG TABLET    Take 1 tablet by mouth every 4 (four) hours as needed for moderate pain.   LIDOCAINE (LIDODERM) 5 %    Place 1 patch onto the skin every 12 (twelve) hours. Remove & Discard patch within 12 hours or as directed by MD   NAPROXEN (NAPROSYN) 500 MG TABLET    Take 1 tablet (500 mg total) by mouth 2 (two) times daily  with a meal.     Note:  This document was prepared using Dragon voice recognition software and may include unintentional dictation errors.    Merlyn Lot, MD 07/26/17 1249

## 2017-07-26 NOTE — ED Notes (Signed)
Pt just back from MRI

## 2017-07-26 NOTE — ED Triage Notes (Signed)
Pt reports numbness to the RIGHT arm starting in the shoulder blade radiating down through the arm to his finger times.  Pt states when he moves his head to the left, a pain shoots down his right side.  Pt states he has a hx of a herniated disc in the past.  Pt states he has had some lightheadedness as well.

## 2017-07-27 ENCOUNTER — Telehealth: Payer: Self-pay

## 2017-07-27 NOTE — Telephone Encounter (Signed)
Please advise 

## 2017-07-27 NOTE — Telephone Encounter (Signed)
Patient called to schedule a ER Follow Up. He states he was seen at [facility] ER on 07/26/17. No available appointments this week for ER F/U. Is there a time he can be worked in. He states he is in a lot of pain. Please advise. CB# [phone]

## 2017-07-28 ENCOUNTER — Encounter: Payer: Self-pay | Admitting: Family Medicine

## 2017-07-28 ENCOUNTER — Ambulatory Visit (INDEPENDENT_AMBULATORY_CARE_PROVIDER_SITE_OTHER): Payer: BC Managed Care – PPO | Admitting: Family Medicine

## 2017-07-28 VITALS — BP 144/80 | HR 62 | Temp 98.4°F | Resp 16 | Wt 187.0 lb

## 2017-07-28 DIAGNOSIS — M47812 Spondylosis without myelopathy or radiculopathy, cervical region: Secondary | ICD-10-CM

## 2017-07-28 MED ORDER — PREDNISONE 10 MG PO TABS: ORAL_TABLET | ORAL | 0 refills | Status: AC

## 2017-07-28 NOTE — Progress Notes (Signed)
Patient: Micheal Mccoy Male    DOB: 28-Jul-1955   62 y.o.   MRN: [id number] Visit Date: 07/28/2017  Today's Provider: Lelon Huh, MD   Chief Complaint  Patient presents with  . Follow-up   Subjective:    HPI   Follow up ER visit  Patient was seen in ER for Paresthesia and cervical radiculopathy on 07/26/2017. He was treated for; Paresthesia and cervical radiculopathy. Treatment for this included; patient was given rx for hydrocodone-ace, lidocaine patches, and naproxen. Advised to follow up with pcp and neurosurgery. He reports good compliance with treatment. He reports this condition is Unchanged. Patient states he is still experiencing significant neck and back pain even after taking the prescribed medications. He also has numbness in his right arm. He states sitting and standing aggravates the pain. Today patient rates pain level 9/10.  He had MRI c-spine while in the ER showing 1. Diffuse cervical disc degeneration without high-grade spinal stenosis. 2. Moderate left greater than right neural foraminal stenosis at C7-T1. 3. Moderate to severe left neural foraminal stenosis at T1-2. ------------------------------------------------------------------------------------    No Known Allergies   Current Outpatient Medications:  .  acetaminophen (TYLENOL) 325 MG tablet, Take 650 mg by mouth every 6 (six) hours as needed., Disp: , Rfl:  .  HYDROcodone-acetaminophen (NORCO) 5-325 MG tablet, Take 1 tablet by mouth every 4 (four) hours as needed for moderate pain., Disp: 6 tablet, Rfl: 0 .  lidocaine (LIDODERM) 5 %, Place 1 patch onto the skin every 12 (twelve) hours. Remove & Discard patch within 12 hours or as directed by MD, Disp: 10 patch, Rfl: 0 .  Multiple Vitamin (MULTIVITAMIN) tablet, Take 1 tablet by mouth daily., Disp: , Rfl:  .  naproxen (NAPROSYN) 500 MG tablet, Take 1 tablet (500 mg total) by mouth 2 (two) times daily with a meal., Disp: 20 tablet, Rfl: 0 .   sildenafil (REVATIO) 20 MG tablet, Take 1 - 5 tabs PO daily prn, Disp: 30 tablet, Rfl: 3 .  silodosin (RAPAFLO) 8 MG CAPS capsule, Take 1 capsule (8 mg total) by mouth daily with breakfast., Disp: 30 capsule, Rfl: 11  Review of Systems  Constitutional: Negative for appetite change, chills and fever.  Respiratory: Negative for chest tightness, shortness of breath and wheezing.   Cardiovascular: Negative for chest pain and palpitations.  Gastrointestinal: Negative for abdominal pain, nausea and vomiting.  Musculoskeletal: Positive for back pain, neck pain and neck stiffness.  Neurological: Positive for numbness (right arm).    Social History   Tobacco Use  . Smoking status: Never Smoker  . Smokeless tobacco: Never Used  Substance Use Topics  . Alcohol use: No   Objective:   BP (!) 144/80 (BP Location: Left Arm, Patient Position: Sitting, Cuff Size: Large)   Pulse 62   Temp 98.4 F (36.9 C) (Oral)   Resp 16   Wt 187 lb (84.8 kg)   SpO2 99% Comment: room air  BMI 23.37 kg/m  Vitals:   07/28/17 1057  BP: (!) 144/80  Pulse: 62  Resp: 16  Temp: 98.4 F (36.9 C)  TempSrc: Oral  SpO2: 99%  Weight: 187 lb (84.8 kg)     Physical Exam   General Appearance:    Alert, cooperative, no distress  Eyes:    PERRL, conjunctiva/corneas clear, EOM's intact       Neurologic:   Awake, alert, oriented x 3. +4 MS on right, +5 on left. Slightly diminished reflex RUE.  Diminished s/s RUE.          Assessment & Plan:     1. Cervical spondylosis without myelopathy  - predniSONE (DELTASONE) 10 MG tablet; 6 tablets for 2 days, then 5 for 2 days, then 4 for 2 days, then 3 for 2 days, then 2 for 2 days, then 1 for 2 days.  Dispense: 42 tablet; Refill: 0 - Ambulatory referral to Neurosurgery       Lelon Huh, MD  [facility]

## 2017-11-19 ENCOUNTER — Other Ambulatory Visit: Payer: Self-pay

## 2017-11-19 DIAGNOSIS — R972 Elevated prostate specific antigen [PSA]: Secondary | ICD-10-CM

## 2017-11-20 ENCOUNTER — Other Ambulatory Visit: Payer: BC Managed Care – PPO

## 2017-11-20 DIAGNOSIS — R972 Elevated prostate specific antigen [PSA]: Secondary | ICD-10-CM

## 2017-11-21 LAB — PSA: PROSTATE SPECIFIC AG, SERUM: 3.1 ng/mL (ref 0.0–4.0)

## 2017-11-27 ENCOUNTER — Ambulatory Visit: Payer: BC Managed Care – PPO

## 2017-11-30 ENCOUNTER — Encounter: Payer: Self-pay | Admitting: Urology

## 2017-11-30 ENCOUNTER — Ambulatory Visit (INDEPENDENT_AMBULATORY_CARE_PROVIDER_SITE_OTHER): Payer: BC Managed Care – PPO | Admitting: Urology

## 2017-11-30 VITALS — BP 136/82 | HR 62 | Ht 75.0 in | Wt 186.5 lb

## 2017-11-30 DIAGNOSIS — Z125 Encounter for screening for malignant neoplasm of prostate: Secondary | ICD-10-CM | POA: Diagnosis not present

## 2017-11-30 MED ORDER — SILDENAFIL CITRATE 20 MG PO TABS: ORAL_TABLET | ORAL | 3 refills | Status: DC

## 2017-11-30 NOTE — Progress Notes (Signed)
   11/30/2017 12:25 PM   Micheal Mccoy February 01, 1955 [id number]  Reason for visit: Follow up screening PSA, BPH  HPI: I saw Micheal Mccoy in urology clinic today for PSA follow-up and BPH.  He is a 62 year old African-American male who was previously followed by Dr. Pilar Jarvis.  He had a negative prostate biopsy in February 2017 for PSA of 4.2.  He is intermittently been on finasteride in the past, however he says he stopped approximately 1 year ago.  His PSA today is stable at 3.1 from 3.9 in May 2019.  Regarding his BPH symptoms, he primarily has nocturia 2-3 times per night, weak stream, and intermittency.  He was previously on Rapaflo as well, however he stopped this secondary to bothersome retrograde ejaculation.  He has a very large prostate measured at 130 g on CT.  He also had a negative microscopic hematuria work-up in 2017.  He is on Revatio for erectile dysfunction with good results.  He has a history of a plication by Dr. Karsten Ro in 2014 for Peyronie's disease.  ROS: Please see flowsheet from today's date for complete review of systems.  Physical Exam: BP 136/82 (BP Location: Left Arm, Patient Position: Sitting, Cuff Size: Normal)   Pulse 62   Ht 6\' 3"  (1.905 m)   Wt 186 lb 8 oz (84.6 kg)   BMI 23.31 kg/m    Constitutional:  Alert and oriented, No acute distress. Respiratory: Normal respiratory effort, no increased work of breathing. GI: Abdomen is soft, nontender, nondistended, no abdominal masses GU: No CVA tenderness DRE: Deferred Skin: No rashes, bruises or suspicious lesions. Neurologic: Grossly intact, no focal deficits, moving all 4 extremities. Psychiatric: Normal mood and affect  Laboratory Data: PSA 3.1 from 3.9 in May 2019  Pertinent Imaging: None to review  Assessment & Plan:   In summary, Micheal Mccoy is a 62 year old African-American male with a history of elevated PSA of 4.2 and negative biopsy in 2017.  He has a large 130 g prostate with intravesical lobe.   He has been off finasteride for approximately 1 year.  PSA is stable at 3.1 from 3.9 six months ago.  His urinary symptoms are moderately controlled with behavioral strategies, as he does not like taking any medications for his BPH.  He would be a good candidate for HOLEP in the future if his urinary symptoms were bothersome, however he does not want retrograde ejaculation.  Return in about 1 year (around 12/01/2018) for psa prior, discuss LUTS.  Billey Co, [facility] [address], [place], [place] [address] [phone]

## 2017-12-16 ENCOUNTER — Other Ambulatory Visit: Payer: Self-pay | Admitting: Neurosurgery

## 2017-12-16 DIAGNOSIS — M5412 Radiculopathy, cervical region: Secondary | ICD-10-CM

## 2017-12-16 DIAGNOSIS — M4802 Spinal stenosis, cervical region: Secondary | ICD-10-CM

## 2018-01-05 ENCOUNTER — Ambulatory Visit
Admission: RE | Admit: 2018-01-05 | Discharge: 2018-01-05 | Disposition: A | Discharge status: Home / Self Care | Payer: BC Managed Care – PPO | Source: Ambulatory Visit | Attending: Neurosurgery | Admitting: Neurosurgery

## 2018-01-05 DIAGNOSIS — M4802 Spinal stenosis, cervical region: Secondary | ICD-10-CM | POA: Insufficient documentation

## 2018-01-05 DIAGNOSIS — M5412 Radiculopathy, cervical region: Secondary | ICD-10-CM | POA: Diagnosis present

## 2018-01-14 ENCOUNTER — Ambulatory Visit
Admission: RE | Admit: 2018-01-14 | Discharge: 2018-01-14 | Disposition: A | Discharge status: Home / Self Care | Payer: BC Managed Care – PPO | Source: Ambulatory Visit | Attending: Neurosurgery | Admitting: Neurosurgery

## 2018-01-14 ENCOUNTER — Other Ambulatory Visit: Payer: Self-pay

## 2018-01-14 ENCOUNTER — Encounter
Admission: RE | Admit: 2018-01-14 | Discharge: 2018-01-14 | Disposition: A | Discharge status: Home / Self Care | Payer: BC Managed Care – PPO | Source: Ambulatory Visit | Attending: Neurosurgery | Admitting: Neurosurgery

## 2018-01-14 DIAGNOSIS — Z01818 Encounter for other preprocedural examination: Secondary | ICD-10-CM | POA: Diagnosis present

## 2018-01-14 LAB — CBC
HEMATOCRIT: 48.2 % (ref 39.0–52.0)
HEMOGLOBIN: 15.2 g/dL (ref 13.0–17.0)
MCH: 26.2 pg (ref 26.0–34.0)
MCHC: 31.5 g/dL (ref 30.0–36.0)
MCV: 83 fL (ref 80.0–100.0)
Platelets: 211 10*3/uL (ref 150–400)
RBC: 5.81 MIL/uL (ref 4.22–5.81)
RDW: 13.9 % (ref 11.5–15.5)
WBC: 3.5 10*3/uL — AB (ref 4.0–10.5)
nRBC: 0 % (ref 0.0–0.2)

## 2018-01-14 LAB — DIFFERENTIAL
Abs Immature Granulocytes: 0 10*3/uL (ref 0.00–0.07)
BASOS ABS: 0 10*3/uL (ref 0.0–0.1)
Basophils Relative: 1 %
EOS PCT: 3 %
Eosinophils Absolute: 0.1 10*3/uL (ref 0.0–0.5)
Immature Granulocytes: 0 %
LYMPHS PCT: 34 %
Lymphs Abs: 1.2 10*3/uL (ref 0.7–4.0)
MONO ABS: 0.4 10*3/uL (ref 0.1–1.0)
MONOS PCT: 11 %
NEUTROS ABS: 1.8 10*3/uL (ref 1.7–7.7)
Neutrophils Relative %: 51 %

## 2018-01-14 LAB — SURGICAL PCR SCREEN
MRSA, PCR: NEGATIVE
STAPHYLOCOCCUS AUREUS: NEGATIVE

## 2018-01-14 LAB — BASIC METABOLIC PANEL
Anion gap: 7 (ref 5–15)
BUN: 10 mg/dL (ref 8–23)
CO2: 28 mmol/L (ref 22–32)
CREATININE: 0.85 mg/dL (ref 0.61–1.24)
Calcium: 9.1 mg/dL (ref 8.9–10.3)
Chloride: 103 mmol/L (ref 98–111)
GFR calc Af Amer: >60 mL/min (ref >60)
GFR calc non Af Amer: >60 mL/min (ref >60)
GLUCOSE: 80 mg/dL (ref 70–99)
Potassium: 3.9 mmol/L (ref 3.5–5.1)
Sodium: 138 mmol/L (ref 135–145)

## 2018-01-14 LAB — PROTIME-INR
INR: 1.11
Prothrombin Time: 14.2 seconds (ref 11.4–15.2)

## 2018-01-14 LAB — TYPE AND SCREEN
ABO/RH(D): A POS
Antibody Screen: NEGATIVE

## 2018-01-14 LAB — APTT: aPTT: 31 seconds (ref 24–36)

## 2018-01-14 NOTE — Patient Instructions (Signed)
Your procedure is scheduled on: Monday 01/25/2018 Report to [facility]. To find out your arrival time please call [phone] between 1PM - 3PM on Friday 01/22/2018.  Remember: Instructions that are not followed completely may result in serious medical risk, up to and including death, or upon the discretion of your surgeon and anesthesiologist your surgery may need to be rescheduled.     _X__ 1. Do not eat food after midnight the night before your procedure.                 No gum chewing or hard candies. You may drink clear liquids up to 2 hours                 before you are scheduled to arrive for your surgery- DO not drink clear                 liquids within 2 hours of the start of your surgery.                 Clear Liquids include:  water, apple juice without pulp, clear carbohydrate                 drink such as Clearfast or Gatorade, Black Coffee or Tea (Do not add                 anything to coffee or tea).  __X__2.  On the morning of surgery brush your teeth with toothpaste and water, you  may rinse your mouth with mouthwash if you wish.  Do not swallow any  toothpaste of mouthwash.     _X__ 3.  No Alcohol for 24 hours before or after surgery.   _X__ 4.  Do Not Smoke or use e-cigarettes For 24 Hours Prior to Your Surgery.                 Do not use any chewable tobacco products for at least 6 hours prior to                 surgery.  ____  5.  Bring all medications with you on the day of surgery if instructed.   __X__  6.  Notify your doctor if there is any change in your medical condition      (cold, fever, infections).     Do not wear jewelry, make-up, hairpins, clips or nail polish. Do not wear lotions, powders, or perfumes.  Do not shave 48 hours prior to surgery. Men may shave face and neck. Do not bring valuables to the hospital.    [facility] is not responsible for any belongings or valuables.  Contacts,  dentures/partials or body piercings may not be worn into surgery. Bring a case for your contacts, glasses or hearing aids, a denture cup will be supplied. Leave your suitcase in the car. After surgery it may be brought to your room. For patients admitted to the hospital, discharge time is determined by your treatment team.   Patients discharged the day of surgery will not be allowed to drive home.   Please read over the following fact sheets that you were given:   MRSA Information  __X__ Take these medicines the morning of surgery with A SIP OF WATER:    1. NONE  2.   3.   4.  5.  6.  ____ Fleet Enema (as directed)   __X__ Use CHG Soap/SAGE wipes  as directed  ____ Use inhalers on the day of surgery  ____ Stop metformin/Janumet/Farxiga 2 days prior to surgery    ____ Take 1/2 of usual insulin dose the night before surgery. No insulin the morning          of surgery.   ____ Stop Blood Thinners Coumadin/Plavix/Xarelto/Pleta/Pradaxa/Eliquis/Effient/Aspirin  on   Or contact your Surgeon, Cardiologist or Medical Doctor regarding  ability to stop your blood thinners  __X__ Stop Anti-inflammatories 7 days before surgery such as Advil, Ibuprofen, Motrin,  BC or Goodies Powder, Naprosyn, Naproxen, Aleve, Aspirin   May use Tylenol or acetaminophen for aches or pains  __X__ Stop all herbal supplements, fish oil or vitamin E until after surgery.    ____ Bring C-Pap to the hospital.

## 2018-01-25 ENCOUNTER — Ambulatory Visit: Payer: BC Managed Care – PPO

## 2018-01-25 ENCOUNTER — Encounter: Payer: Self-pay | Admitting: *Deleted

## 2018-01-25 ENCOUNTER — Ambulatory Visit: Payer: BC Managed Care – PPO | Admitting: Certified Registered Nurse Anesthetist

## 2018-01-25 ENCOUNTER — Other Ambulatory Visit: Payer: Self-pay

## 2018-01-25 ENCOUNTER — Observation Stay
Admission: RE | Admit: 2018-01-25 | Discharge: 2018-01-26 | Disposition: A | Discharge status: Home / Self Care | Payer: BC Managed Care – PPO | Attending: Neurosurgery | Admitting: Neurosurgery

## 2018-01-25 ENCOUNTER — Observation Stay: Payer: BC Managed Care – PPO

## 2018-01-25 ENCOUNTER — Encounter
Admission: RE | Disposition: A | Discharge status: Home / Self Care | Payer: Self-pay | Source: Home / Self Care | Attending: Neurosurgery

## 2018-01-25 DIAGNOSIS — M4802 Spinal stenosis, cervical region: Secondary | ICD-10-CM | POA: Insufficient documentation

## 2018-01-25 DIAGNOSIS — R079 Chest pain, unspecified: Secondary | ICD-10-CM | POA: Diagnosis not present

## 2018-01-25 DIAGNOSIS — G8918 Other acute postprocedural pain: Secondary | ICD-10-CM | POA: Diagnosis not present

## 2018-01-25 DIAGNOSIS — M5013 Cervical disc disorder with radiculopathy, cervicothoracic region: Secondary | ICD-10-CM | POA: Diagnosis present

## 2018-01-25 DIAGNOSIS — Z419 Encounter for procedure for purposes other than remedying health state, unspecified: Secondary | ICD-10-CM

## 2018-01-25 DIAGNOSIS — K219 Gastro-esophageal reflux disease without esophagitis: Secondary | ICD-10-CM | POA: Insufficient documentation

## 2018-01-25 DIAGNOSIS — Z981 Arthrodesis status: Secondary | ICD-10-CM

## 2018-01-25 HISTORY — PX: THORACIC DISCECTOMY: SHX100

## 2018-01-25 HISTORY — PX: ANTERIOR CERVICAL DISCECTOMY: SHX1160

## 2018-01-25 HISTORY — PX: ANTERIOR CERVICAL DECOMP/DISCECTOMY FUSION: SHX1161

## 2018-01-25 LAB — BASIC METABOLIC PANEL
Anion gap: 7 (ref 5–15)
BUN: 10 mg/dL (ref 8–23)
CO2: 27 mmol/L (ref 22–32)
CREATININE: 1 mg/dL (ref 0.61–1.24)
Calcium: 9.1 mg/dL (ref 8.9–10.3)
Chloride: 104 mmol/L (ref 98–111)
GFR calc Af Amer: >60 mL/min (ref >60)
GFR calc non Af Amer: >60 mL/min (ref >60)
Glucose, Bld: 204 mg/dL — ABNORMAL HIGH (ref 70–99)
Potassium: 4.3 mmol/L (ref 3.5–5.1)
Sodium: 138 mmol/L (ref 135–145)

## 2018-01-25 LAB — ABO/RH: ABO/RH(D): A POS

## 2018-01-25 SURGERY — ANTERIOR CERVICAL DECOMPRESSION/DISCECTOMY FUSION 1 LEVEL
Anesthesia: General

## 2018-01-25 MED ORDER — LACTATED RINGERS IV SOLN
INTRAVENOUS | Status: DC
  Administered 2018-01-25 (×2): via INTRAVENOUS

## 2018-01-25 MED ORDER — BACITRACIN 50000 UNITS IM SOLR
INTRAMUSCULAR | Status: AC
  Filled 2018-01-25: qty 1

## 2018-01-25 MED ORDER — FENTANYL CITRATE (PF) 100 MCG/2ML IJ SOLN: 25.0000 ug | INTRAMUSCULAR | Status: DC | PRN

## 2018-01-25 MED ORDER — PHENYLEPHRINE HCL 10 MG/ML IJ SOLN
INTRAMUSCULAR | Status: DC | PRN
  Administered 2018-01-25 (×7): 100 ug via INTRAVENOUS

## 2018-01-25 MED ORDER — SODIUM CHLORIDE 0.9% FLUSH: 3.0000 mL | INTRAVENOUS | Status: DC | PRN

## 2018-01-25 MED ORDER — PHENOL 1.4 % MT LIQD
1.0000 | OROMUCOSAL | Status: DC | PRN
  Filled 2018-01-25: qty 177

## 2018-01-25 MED ORDER — LIDOCAINE HCL (PF) 2 % IJ SOLN
INTRAMUSCULAR | Status: AC
  Filled 2018-01-25: qty 10

## 2018-01-25 MED ORDER — ACETAMINOPHEN 500 MG PO TABS
1000.0000 mg | ORAL_TABLET | Freq: Four times a day (QID) | ORAL | Status: AC
  Administered 2018-01-25 – 2018-01-26 (×3): 1000 mg via ORAL
  Filled 2018-01-25 (×3): qty 2

## 2018-01-25 MED ORDER — SODIUM CHLORIDE 0.9 % IV SOLN: 250.0000 mL | INTRAVENOUS | Status: DC

## 2018-01-25 MED ORDER — CEFAZOLIN SODIUM-DEXTROSE 2-4 GM/100ML-% IV SOLN
INTRAVENOUS | Status: AC
  Filled 2018-01-25: qty 100

## 2018-01-25 MED ORDER — THROMBIN 5000 UNITS EX SOLR
CUTANEOUS | Status: AC
  Filled 2018-01-25: qty 5000

## 2018-01-25 MED ORDER — HYDROMORPHONE HCL 1 MG/ML IJ SOLN: 0.5000 mg | INTRAMUSCULAR | Status: DC | PRN

## 2018-01-25 MED ORDER — ROCURONIUM BROMIDE 50 MG/5ML IV SOLN
INTRAVENOUS | Status: AC
  Filled 2018-01-25: qty 1

## 2018-01-25 MED ORDER — FENTANYL CITRATE (PF) 100 MCG/2ML IJ SOLN
INTRAMUSCULAR | Status: DC | PRN
  Administered 2018-01-25 (×4): 50 ug via INTRAVENOUS

## 2018-01-25 MED ORDER — KETAMINE HCL 50 MG/ML IJ SOLN
INTRAMUSCULAR | Status: AC
  Filled 2018-01-25: qty 10

## 2018-01-25 MED ORDER — OXYCODONE HCL 5 MG/5ML PO SOLN: 5.0000 mg | Freq: Once | ORAL | Status: DC | PRN

## 2018-01-25 MED ORDER — CEFAZOLIN SODIUM-DEXTROSE 2-4 GM/100ML-% IV SOLN
2.0000 g | INTRAVENOUS | Status: AC
  Administered 2018-01-25: 2 g via INTRAVENOUS

## 2018-01-25 MED ORDER — ACETAMINOPHEN 10 MG/ML IV SOLN
INTRAVENOUS | Status: AC
  Filled 2018-01-25: qty 100

## 2018-01-25 MED ORDER — LIDOCAINE-EPINEPHRINE 1 %-1:100000 IJ SOLN
INTRAMUSCULAR | Status: DC | PRN
  Administered 2018-01-25: 6 mL

## 2018-01-25 MED ORDER — SODIUM CHLORIDE FLUSH 0.9 % IV SOLN
INTRAVENOUS | Status: AC
  Filled 2018-01-25: qty 10

## 2018-01-25 MED ORDER — ONDANSETRON HCL 4 MG PO TABS: 4.0000 mg | ORAL_TABLET | Freq: Four times a day (QID) | ORAL | Status: DC | PRN

## 2018-01-25 MED ORDER — PROMETHAZINE HCL 25 MG/ML IJ SOLN: 6.2500 mg | INTRAMUSCULAR | Status: DC | PRN

## 2018-01-25 MED ORDER — FENTANYL CITRATE (PF) 100 MCG/2ML IJ SOLN
INTRAMUSCULAR | Status: AC
  Filled 2018-01-25: qty 2

## 2018-01-25 MED ORDER — SODIUM CHLORIDE 0.9 % IR SOLN
Status: DC | PRN
  Administered 2018-01-25: 1000 mL

## 2018-01-25 MED ORDER — MENTHOL 3 MG MT LOZG
1.0000 | LOZENGE | OROMUCOSAL | Status: DC | PRN
  Filled 2018-01-25: qty 9

## 2018-01-25 MED ORDER — MIDAZOLAM HCL 2 MG/2ML IJ SOLN
INTRAMUSCULAR | Status: AC
  Filled 2018-01-25: qty 2

## 2018-01-25 MED ORDER — METHOCARBAMOL 500 MG PO TABS: 500.0000 mg | ORAL_TABLET | Freq: Four times a day (QID) | ORAL | Status: DC | PRN

## 2018-01-25 MED ORDER — FAMOTIDINE 20 MG PO TABS
20.0000 mg | ORAL_TABLET | Freq: Once | ORAL | Status: AC
  Administered 2018-01-25: 20 mg via ORAL

## 2018-01-25 MED ORDER — EPHEDRINE SULFATE 50 MG/ML IJ SOLN
INTRAMUSCULAR | Status: AC
  Filled 2018-01-25: qty 1

## 2018-01-25 MED ORDER — PROPOFOL 500 MG/50ML IV EMUL
INTRAVENOUS | Status: AC
  Filled 2018-01-25: qty 50

## 2018-01-25 MED ORDER — DEXAMETHASONE SODIUM PHOSPHATE 10 MG/ML IJ SOLN
INTRAMUSCULAR | Status: DC | PRN
  Administered 2018-01-25: 10 mg via INTRAVENOUS

## 2018-01-25 MED ORDER — PROPOFOL 500 MG/50ML IV EMUL
INTRAVENOUS | Status: DC | PRN
  Administered 2018-01-25: 140 ug/kg/min via INTRAVENOUS

## 2018-01-25 MED ORDER — REMIFENTANIL HCL 1 MG IV SOLR
INTRAVENOUS | Status: AC
  Filled 2018-01-25: qty 1000

## 2018-01-25 MED ORDER — PROPOFOL 10 MG/ML IV BOLUS
INTRAVENOUS | Status: AC
  Filled 2018-01-25: qty 20

## 2018-01-25 MED ORDER — SUCCINYLCHOLINE CHLORIDE 20 MG/ML IJ SOLN
INTRAMUSCULAR | Status: AC
  Filled 2018-01-25: qty 1

## 2018-01-25 MED ORDER — OXYCODONE HCL 5 MG PO TABS
5.0000 mg | ORAL_TABLET | ORAL | Status: DC | PRN
  Administered 2018-01-25: 5 mg via ORAL
  Filled 2018-01-25: qty 1

## 2018-01-25 MED ORDER — ONDANSETRON HCL 4 MG/2ML IJ SOLN
INTRAMUSCULAR | Status: AC
  Filled 2018-01-25: qty 2

## 2018-01-25 MED ORDER — SODIUM CHLORIDE 0.9 % IV SOLN
INTRAVENOUS | Status: DC | PRN
  Administered 2018-01-25: .2 ug/kg/min via INTRAVENOUS

## 2018-01-25 MED ORDER — OXYCODONE HCL 5 MG PO TABS: 5.0000 mg | ORAL_TABLET | Freq: Once | ORAL | Status: DC | PRN

## 2018-01-25 MED ORDER — GELATIN ABSORBABLE 12-7 MM EX MISC
CUTANEOUS | Status: DC | PRN
  Administered 2018-01-25: 1 via TOPICAL

## 2018-01-25 MED ORDER — SENNOSIDES-DOCUSATE SODIUM 8.6-50 MG PO TABS: 1.0000 | ORAL_TABLET | Freq: Every evening | ORAL | Status: DC | PRN

## 2018-01-25 MED ORDER — ONDANSETRON HCL 4 MG/2ML IJ SOLN: 4.0000 mg | Freq: Four times a day (QID) | INTRAMUSCULAR | Status: DC | PRN

## 2018-01-25 MED ORDER — METHOCARBAMOL 1000 MG/10ML IJ SOLN
500.0000 mg | Freq: Four times a day (QID) | INTRAVENOUS | Status: DC | PRN
  Filled 2018-01-25: qty 5

## 2018-01-25 MED ORDER — MIDAZOLAM HCL 2 MG/2ML IJ SOLN
INTRAMUSCULAR | Status: DC | PRN
  Administered 2018-01-25: 2 mg via INTRAVENOUS

## 2018-01-25 MED ORDER — ROCURONIUM BROMIDE 100 MG/10ML IV SOLN
INTRAVENOUS | Status: DC | PRN
  Administered 2018-01-25: 10 mg via INTRAVENOUS

## 2018-01-25 MED ORDER — LIDOCAINE-EPINEPHRINE 1 %-1:100000 IJ SOLN
INTRAMUSCULAR | Status: AC
  Filled 2018-01-25: qty 1

## 2018-01-25 MED ORDER — ACETAMINOPHEN 325 MG PO TABS: 650.0000 mg | ORAL_TABLET | ORAL | Status: DC | PRN

## 2018-01-25 MED ORDER — LIDOCAINE HCL (CARDIAC) PF 100 MG/5ML IV SOSY
PREFILLED_SYRINGE | INTRAVENOUS | Status: DC | PRN
  Administered 2018-01-25: 80 mg via INTRAVENOUS

## 2018-01-25 MED ORDER — SODIUM CHLORIDE 0.9 % IV SOLN
INTRAVENOUS | Status: DC
  Administered 2018-01-25: 16:00:00 via INTRAVENOUS

## 2018-01-25 MED ORDER — DEXAMETHASONE SODIUM PHOSPHATE 10 MG/ML IJ SOLN
INTRAMUSCULAR | Status: AC
  Filled 2018-01-25: qty 1

## 2018-01-25 MED ORDER — KETAMINE HCL 50 MG/ML IJ SOLN
INTRAMUSCULAR | Status: DC | PRN
  Administered 2018-01-25: 50 mg via INTRAMUSCULAR

## 2018-01-25 MED ORDER — PHENYLEPHRINE HCL 10 MG/ML IJ SOLN
INTRAMUSCULAR | Status: AC
  Filled 2018-01-25: qty 1

## 2018-01-25 MED ORDER — SODIUM CHLORIDE 0.9% FLUSH
INTRAVENOUS | Status: DC | PRN
  Administered 2018-01-25: 10 mL

## 2018-01-25 MED ORDER — MEPERIDINE HCL 50 MG/ML IJ SOLN: 6.2500 mg | INTRAMUSCULAR | Status: DC | PRN

## 2018-01-25 MED ORDER — PROPOFOL 10 MG/ML IV BOLUS
INTRAVENOUS | Status: DC | PRN
  Administered 2018-01-25: 150 mg via INTRAVENOUS

## 2018-01-25 MED ORDER — EPHEDRINE SULFATE 50 MG/ML IJ SOLN
INTRAMUSCULAR | Status: DC | PRN
  Administered 2018-01-25 (×2): 10 mg via INTRAVENOUS

## 2018-01-25 MED ORDER — SODIUM CHLORIDE (PF) 0.9 % IJ SOLN
INTRAMUSCULAR | Status: AC
  Filled 2018-01-25: qty 20

## 2018-01-25 MED ORDER — OXYCODONE HCL 5 MG PO TABS: 10.0000 mg | ORAL_TABLET | ORAL | Status: DC | PRN

## 2018-01-25 MED ORDER — SUCCINYLCHOLINE CHLORIDE 20 MG/ML IJ SOLN
INTRAMUSCULAR | Status: DC | PRN
  Administered 2018-01-25: 80 mg via INTRAVENOUS

## 2018-01-25 MED ORDER — ACETAMINOPHEN 10 MG/ML IV SOLN
INTRAVENOUS | Status: DC | PRN
  Administered 2018-01-25: 1000 mg via INTRAVENOUS

## 2018-01-25 MED ORDER — GELATIN ABSORBABLE 12-7 MM EX MISC
CUTANEOUS | Status: AC
  Filled 2018-01-25: qty 1

## 2018-01-25 MED ORDER — THROMBIN 5000 UNITS EX SOLR
CUTANEOUS | Status: DC | PRN
  Administered 2018-01-25: 5000 [IU] via TOPICAL

## 2018-01-25 MED ORDER — LIDOCAINE HCL 4 % MT SOLN
OROMUCOSAL | Status: DC | PRN
  Administered 2018-01-25: 4 mL via TOPICAL

## 2018-01-25 MED ORDER — DEXMEDETOMIDINE HCL 200 MCG/2ML IV SOLN
INTRAVENOUS | Status: DC | PRN
  Administered 2018-01-25 (×2): 8 ug via INTRAVENOUS
  Administered 2018-01-25: 4 ug via INTRAVENOUS

## 2018-01-25 MED ORDER — ONDANSETRON HCL 4 MG/2ML IJ SOLN
INTRAMUSCULAR | Status: DC | PRN
  Administered 2018-01-25: 4 mg via INTRAVENOUS

## 2018-01-25 MED ORDER — SODIUM CHLORIDE 0.9% FLUSH
3.0000 mL | Freq: Two times a day (BID) | INTRAVENOUS | Status: DC
  Administered 2018-01-25: 3 mL via INTRAVENOUS

## 2018-01-25 MED ORDER — ACETAMINOPHEN 650 MG RE SUPP: 650.0000 mg | RECTAL | Status: DC | PRN

## 2018-01-25 MED ORDER — FAMOTIDINE 20 MG PO TABS
ORAL_TABLET | ORAL | Status: AC
  Filled 2018-01-25: qty 1

## 2018-01-25 SURGICAL SUPPLY — 72 items
BIT DRILL 11 STRL (BIT) ×1
BIT DRILL 11MM SERILE (BIT) IMPLANT
BIT DRILL 13 (BIT) IMPLANT
BLADE BOVIE TIP EXT 4 (BLADE) ×2 IMPLANT
BLADE SURG 15 STRL LF DISP TIS (BLADE) ×1 IMPLANT
BLADE SURG 15 STRL SS (BLADE) ×1
BONE WEDGE CONERSTONE 5X14X11 (Bone Implant) ×1 IMPLANT
BONE WEDGE CONERSTONE 6X14X11 (Bone Implant) ×1 IMPLANT
BUR DIAMOND COARSE 4.0 RND (BURR) ×2 IMPLANT
BUR NEURO DRILL SOFT 3.0X3.8M (BURR) ×2 IMPLANT
CANISTER SUCT 1200ML W/VALVE (MISCELLANEOUS) ×2 IMPLANT
CHLORAPREP W/TINT 26ML (MISCELLANEOUS) ×4 IMPLANT
COLLAR CERV MED MED DENS 3 (SOFTGOODS) IMPLANT
COLLAR CERV SM MED DENS 3 (SOFTGOODS) IMPLANT
COLLAR CERV XL MED DENS 3 (SOFTGOODS) IMPLANT
COUNTER NEEDLE 20/40 LG (NEEDLE) ×2 IMPLANT
COVER LIGHT HANDLE STERIS (MISCELLANEOUS) ×4 IMPLANT
COVER WAND RF STERILE (DRAPES) ×2 IMPLANT
CRADLE LAMINECT ARM (MISCELLANEOUS) ×2 IMPLANT
CUP MEDICINE 2OZ PLAST GRAD ST (MISCELLANEOUS) ×4 IMPLANT
DERMABOND ADVANCED (GAUZE/BANDAGES/DRESSINGS) ×1
DERMABOND ADVANCED .7 DNX12 (GAUZE/BANDAGES/DRESSINGS) ×1 IMPLANT
DRAPE MICROSCOPE SPINE 48X150 (DRAPES) ×2 IMPLANT
DRAPE SURG 17X11 SM STRL (DRAPES) ×4 IMPLANT
DRAPE THYROID T SHEET (DRAPES) ×2 IMPLANT
DRILL BIT 11MM SERILE (BIT) ×1
ELECT CAUTERY BLADE TIP 2.5 (TIP) ×2
ELECT EZSTD 165MM 6.5IN (MISCELLANEOUS) ×2
ELECTRODE CAUTERY BLDE TIP 2.5 (TIP) ×1 IMPLANT
ELECTRODE EZSTD 165MM 6.5IN (MISCELLANEOUS) ×1 IMPLANT
FEE INTRAOP MONITOR IMPULS NCS (MISCELLANEOUS) IMPLANT
GAUZE SPONGE 4X4 12PLY STRL (GAUZE/BANDAGES/DRESSINGS) ×2 IMPLANT
GLOVE INDICATOR 7.0 STRL GRN (GLOVE) ×2 IMPLANT
GLOVE INDICATOR 8.0 STRL GRN (GLOVE) ×2 IMPLANT
GLOVE SURG SYN 7.0 (GLOVE) ×4 IMPLANT
GLOVE SURG SYN 7.0 PF PI (GLOVE) ×2 IMPLANT
GLOVE SURG SYN 8.0 (GLOVE) ×4 IMPLANT
GLOVE SURG SYN 8.0 PF PI (GLOVE) ×2 IMPLANT
GOWN STANDARD M  REUSABLE (MISCELLANEOUS) ×2 IMPLANT
GOWN STRL REUS W/ TWL XL LVL3 (GOWN DISPOSABLE) ×1 IMPLANT
GOWN STRL REUS W/TWL XL LVL3 (GOWN DISPOSABLE) ×1
GRADUATE 1200CC STRL 31836 (MISCELLANEOUS) ×2 IMPLANT
IMPLANT BEARING DEPUY (Knees) ×3 IMPLANT
INTRAOP MONITOR FEE IMPULS NCS (MISCELLANEOUS)
INTRAOP MONITOR FEE IMPULSE (MISCELLANEOUS)
IV CATH ANGIO 12GX3 LT BLUE (NEEDLE) ×2 IMPLANT
KIT TURNOVER KIT A (KITS) ×2 IMPLANT
MARKER SKIN DUAL TIP RULER LAB (MISCELLANEOUS) ×4 IMPLANT
NDL SPNL 22GX3.5 QUINCKE BK (NEEDLE) ×1 IMPLANT
NEEDLE HYPO 22GX1.5 SAFETY (NEEDLE) ×2 IMPLANT
NEEDLE SPNL 22GX3.5 QUINCKE BK (NEEDLE) ×2 IMPLANT
NS IRRIG 1000ML POUR BTL (IV SOLUTION) ×2 IMPLANT
PACK LAMINECTOMY NEURO (CUSTOM PROCEDURE TRAY) ×2 IMPLANT
PASTE BONE GRAFTON 1CC (Bone Implant) ×1 IMPLANT
PIN CASPAR 14 (PIN) ×1 IMPLANT
PIN CASPAR 14MM (PIN) ×2
PLATE ZEVO 2LVL 35MM (Plate) ×1 IMPLANT
SCREW 3.5 SELFDRILL 15MM VARI (Screw) ×2 IMPLANT
SCREW 4.0 SELFDRILL 13MM VARI (Screw) ×1 IMPLANT
SPOGE SURGIFLO 8M (HEMOSTASIS) ×1
SPONGE KITTNER 5P (MISCELLANEOUS) ×2 IMPLANT
SPONGE SURGIFLO 8M (HEMOSTASIS) ×1 IMPLANT
SUT VIC AB 3-0 SH 27 (SUTURE) ×1
SUT VIC AB 3-0 SH 27X BRD (SUTURE) IMPLANT
SUT VICRYL 2-0 SH 8X27 (SUTURE) ×2 IMPLANT
SUT VICRYL 3-0 CR8 SH (SUTURE) ×2 IMPLANT
SYR 30ML LL (SYRINGE) ×2 IMPLANT
TAPE CLOTH 3X10 WHT NS LF (GAUZE/BANDAGES/DRESSINGS) ×2 IMPLANT
TOWEL OR 17X26 4PK STRL BLUE (TOWEL DISPOSABLE) ×2 IMPLANT
TRAY FOLEY MTR SLVR 16FR STAT (SET/KITS/TRAYS/PACK) IMPLANT
TUBING CONNECTING 10 (TUBING) ×2 IMPLANT
drill bit 11mm ×1 IMPLANT

## 2018-01-25 NOTE — Anesthesia Procedure Notes (Signed)
Procedure Name: Intubation Date/Time: 01/25/2018 9:04 AM Performed by: Eben Burow, CRNA Pre-anesthesia Checklist: Patient identified, Emergency Drugs available, Suction available and Patient being monitored Patient Re-evaluated:Patient Re-evaluated prior to induction Oxygen Delivery Method: Circle system utilized Preoxygenation: Pre-oxygenation with 100% oxygen Induction Type: IV induction Ventilation: Mask ventilation without difficulty Laryngoscope Size: McGraph and 4 Grade View: Grade I Tube type: Oral Tube size: 7.5 mm Number of attempts: 1 Airway Equipment and Method: Stylet,  Video-laryngoscopy and LTA kit utilized Placement Confirmation: positive ETCO2 and breath sounds checked- equal and bilateral Secured at: 23 cm Tube secured with: Tape Dental Injury: Teeth and Oropharynx as per pre-operative assessment

## 2018-01-25 NOTE — Transfer of Care (Signed)
Immediate Anesthesia Transfer of Care Note  Patient: Micheal Mccoy  Procedure(s) Performed: ANTERIOR CERVICAL DECOMPRESSION/DISCECTOMY FUSION 1 LEVEL C7-T1, T1-T2 (N/A )  Patient Location: PACU  Anesthesia Type:General  Level of Consciousness: drowsy  Airway & Oxygen Therapy: Patient Spontanous Breathing and Patient connected to face mask oxygen  Post-op Assessment: Report given to RN and Post -op Vital signs reviewed and stable  Post vital signs: Reviewed and stable  Last Vitals:  Vitals Value Taken Time  BP 113/79 01/25/2018 12:30 PM  Temp 36.6 C 01/25/2018 12:30 PM  Pulse 90 01/25/2018 12:34 PM  Resp 0 01/25/2018 12:34 PM  SpO2 97 % 01/25/2018 12:34 PM  Vitals shown include unvalidated device data.  Last Pain:  Vitals:   01/25/18 1230  TempSrc:   PainSc: Asleep         Complications: No apparent anesthesia complications

## 2018-01-25 NOTE — Anesthesia Post-op Follow-up Note (Signed)
Anesthesia QCDR form completed.        

## 2018-01-25 NOTE — Progress Notes (Signed)
Pt C/o pain in his mid chest when breathing. Vs stable. No s/s of distress noted. Dr Lacinda Axon made aware. Order for EKG and chest Xray received. Will continue to monitor Pt.

## 2018-01-25 NOTE — Op Note (Signed)
Operative Note 01/25/2018  PRE-OP DIAGNOSIS:  Cervical Radiculopathy[M54.12] Herniated nucleus pulposus, cervical [M50.20]   POST-OP DIAGNOSIS:Post-Op Diagnosis Codes: Cervical Radiculopathy[M54.12] * Herniated nucleus pulposus, cervical [M50.20]   Procedure(s): 1.ARTHRODESIS, ANTERIOR INTERBODY, INCLUDING DISC SPACE PREPARATION, DISCECTOMY, OSTEOPHYTECTOMY AND DECOMPRESSION OF SPINAL CORD AND/ORNERVE ROOTS; CERVICAL BELOW C2--C7-T1ACDF  2.ARTHRODESIS ANTERIOR INTERBODY W/DISCECTOMY ADDITIONALLEVELT1-T2 3.ANTERIOR INSTRUMENTATION; 2 TO 3 VERTEBRAL SEGMENTS (LIST IN ADDITION TO PRIMARY PROCEDURE) 4.ALLOGRAFT, STRUCTURAL, FOR SPINE SURGERY ONLY (LIST IN ADDITION TO PRIMARY PROCEDURE)  SURGEON: Surgeon(s) and Role: * Malen Gauze, MD - Primary  Marin Olp, [place], Asisstant  ANESTHESIA:General   OPERATIVE FINDINGS: Stenosis atC7/T1and T1/2  OPERATIVE REPORT:   Indications Mr.Grahampresented to our clinic on12/10/19 with ongoing numbness and pain in his right arm.He had a MRI that showed disc herniation and osteophytes causing severe stenosis at C7/T1andT1/2. Given this, we recommended an anterior cervical decompression and fusion to relieve the pressure on thenerve roots and allow for healing.The risks of hematoma, infection, poor bone healing and failure of fusion, cord injury, weakness, numbness, neck pain, stroke, and death were discussed in detail. All questions were answered and the patient elected to proceed with the surgery.  Procedure After obtaining informed consent, the patient was taken to the Operating Room where general anesthesia was induced and the patient intubated. Vascular access was obtained. Decadronand antibioticswereadministered. The head was slightly extended with 10 pounds of head weight applied and imaging used to identify a skin crease overlying the T1vertebral body. Appropriate padding was performed.    The patient was prepped and draped in the usual sterile fashion and a timeout was performed per protocol. Local anesthesia was instilled with epinephrine along the planned incision site. A transverse cervicalincision was performed on the left in a skin crease. The incision was carried to the level of the platysma and then cautery was used to incise the muscle. Blunt dissection was used to expand the plane and the dissection was carried deep medial to the SCM and carotid sheath being careful to identify the trachea and esophagus medially. The prevertebral fascia was identified and this was bluntly dissected to expose the disc spaces. A needle was placed in the disc space and x-ray confirmed the T1/2disc level.   Next, cautery was used to undermine the longus colli muscles bilaterally and identify the C7/T1and T1/2 disc spaces. Caspar pins were placed at [place] a retractor system placed under the muscles to complete the exposure. Next, a combination of curettes and Kerrison rongeurs were used to remove the anterior osteophyte at C7/T1and then the disc material. A 88mm matchstick was used to shave the endplates of the adjacent bodies. A trial spacer was used to size the graft and then hemostasis obtained.  The microscope was brought into the field for the remainder of the surgery. The matchsitickdrill bitwas used to remove the osteophyte/disc complex deep to the level of the PLL at C7/T1. There was disc protrusion at this level that was removed with rongeurs. There was also significant osteophyte at this level that was removed. The PLL was entered with a hook and then the PLL was removed along with remaining disc material to decompress centrally and then out into bilateral neuro foramen. A blunt probe was used to confirm no residual stenosis laterally and a curette used to ensure no posterior osteophyte remained. Floseal was used for hemostasis. Allograft was placed, 26mm in height and placed  slightly recessed to theanterior edge of vertebral bodyto promote arthrodesis.  The caspar pin from C7 was removed and  placed in T2, bone wax was placed. Next, the retractors were moved to the disc space.Next, a combination of curettes and Kerrison rongeurs were used to remove the anterior osteophyte at T1/2and then the disc material. There was more disc material at this level which was removed with curettes. A 39mm matchstick was used to shave the endplates of the adjacent bodies. A trial spacer was used to size the graft and then hemostasis obtained.  The matchsitickdrill bitwas used to remove the osteophyte/disc complex deep to the level of the PLL at T1/2. There was significant disc protrusion on the right at this level that was removed with rongeurs.The PLL was entered with a hook and then the PLL was removed along with remaining disc material to decompress centrally and then out into bilateral neuro foramen. A blunt probe was used to confirm no residual stenosis laterally and a curette used to ensure no posterior osteophyte remained. Floseal was used for hemostasis. Allograft was placed, 67mm in height and placed slightly recessed to theanterior edge of vertebral bodyto promote arthrodesis.  The caspar pins were removed and bone wax placed. The remainder of the osteophytes were removedto allow for plating. Next, atwo level 64mm plate was found to be the adequate size and was placed in the midline and secured with two 17mm screws and four 84mm screws at each bone level. Xray was obtained confirming good graft placement and adequate depth of screws. The retractors were removed. The wound was irrigated copiously and hemostasis obtained. The platysma was closed with 2-0 vicryl suture. The dermis was closed with 3-0 Vicryl and Dermabond was placed on the skin.  The patient had general anesthesia reversed and was extubated following the procedure. He awoke following commands with symmetric  movement. He was taken to the PACU where he continued his recovery and then the ward.   ESTIMATED BLOOD LOSS:50cc   SPECIMENS:None    IMPLANT PASTE BONE Edrick Kins [id number]   Inventory Item: PASTE BONE GRAFTON 1CC Serial no.: [id number] Model/Cat no.: [id number]  Implant name: PASTE BONE Edrick Kins - [id number] Laterality: N/A Area: Spine Cervical  Manufacturer: [employer]oni [employer] Date of Manufacture:    Action: Implanted Number Used: 1   Device Identifier:  Device Identifier Type:    BONE WEDGE CONERSTONE [id number] - [id number]   Inventory Item: BONE WEDGE CONERSTONE [id number] Serial no.: [id number] Model/Cat no.: [id number]  Implant name: BONE Corena Herter [id number] - [id number] Laterality: N/A Area: Spine Cervical  Manufacturer: [employer] Date of Manufacture:    Action: Implanted Number Used: 1   Device Identifier:  Device Identifier Type:    BONE WEDGE CONERSTONE [id number] - [id number]   Inventory Item: BONE WEDGE CONERSTONE [id number] Serial no.: [id number] Model/Cat no.: [id number]  Implant name: BONE Corena Herter [id number] - [id number] Laterality: N/A Area: Spine Cervical  Manufacturer: [employer]oni [employer] Date of Manufacture:    Action: Implanted Number Used: 1   Device Identifier:  Device Identifier Type:    PLATE ZEVO 2LVL 97LG - [id number]   Inventory Item: PLATE ZEVO 2LVL 08XK Serial no.:  Model/Cat no.: [id number]  Implant name: PLATE ZEVO 2LVL 14HF - [id number] Laterality: N/A Area: Spine Cervical  Manufacturer: [employer]oni [employer] Date of Manufacture:    Action: Implanted Number Used: 1   Device Identifier:  Device Identifier Type:    SCREW 3.5 SELFDRILL 15MM VARI - [id number]   Inventory Item: SCREW 3.5 SELFDRILL 15MM VARI Serial no.:  Model/Cat no.: [id number]  Implant name: SCREW 3.5 SELFDRILL 15MM  Jocelyn Lamer - [id number] Laterality: N/A Area: Spine Cervical  Manufacturer: [employer]oni [employer] Date of Manufacture:    Action: Implanted Number Used: 2    Device Identifier:  Device Identifier Type:    medtronic 3.5 92mm screw   Inventory Item:  Serial no.:  Model/Cat no.: [id number]  Implant name: medtronic 3.5 63mm screw Laterality: N/A Area: Spine Cervical  Manufacturer: [place] Date of Manufacture:    Action: Implanted Number Used: 3   Device Identifier:  Device Identifier Type:    SCREW 4.0 SELFDRILL 13MM VARI - [id number]   Inventory Item: SCREW 4.0 SELFDRILL 13MM VARI Serial no.:  Model/Cat no.: [id number]  Implant name: SCREW 4.0 SELFDRILL 13MM VARI - [id number] Laterality: N/A Area: Spine Cervical  Manufacturer: [employer] Roni [employer] Date of Manufacture:    Action: Implanted Number Used: 1   Device Identifier:  Device Identifier Type:      ATTESTATION: I performed the procedure in its entirety with the assistance of Marin Olp, physician assistant  Deetta Perla, MD

## 2018-01-25 NOTE — Anesthesia Postprocedure Evaluation (Signed)
Anesthesia Post Note  Patient: Micheal Mccoy  Procedure(s) Performed: ANTERIOR CERVICAL DECOMPRESSION/DISCECTOMY FUSION 1 LEVEL C7-T1, T1-T2 (N/A )  Patient location during evaluation: PACU Anesthesia Type: General Level of consciousness: awake and alert and oriented Pain management: pain level controlled Vital Signs Assessment: post-procedure vital signs reviewed and stable Respiratory status: spontaneous breathing Cardiovascular status: blood pressure returned to baseline Anesthetic complications: no     Last Vitals:  Vitals:   01/25/18 1330 01/25/18 1345  BP: 126/82 (!) 137/92  Pulse: 88 84  Resp: 14 (!) 22  Temp:    SpO2: 97% 98%    Last Pain:  Vitals:   01/25/18 1330  TempSrc:   PainSc: 0-No pain                 Talecia Sherlin

## 2018-01-25 NOTE — Care Management Obs Status (Signed)
[facility] NOTIFICATION   Patient Details  Name: Micheal Mccoy MRN: [id number] Date of Birth: 09/02/1955   Medicare Observation Status Notification Given:  Yes    Tjuana Vickrey Jen Mow, RN 01/25/2018, 4:25 PM

## 2018-01-25 NOTE — Plan of Care (Signed)
  Problem: Education: Goal: Knowledge of General Education information will improve Description Including pain rating scale, medication(s)/side effects and non-pharmacologic comfort measures Outcome: Progressing   

## 2018-01-25 NOTE — Progress Notes (Signed)
Dr. Lacinda Axon called about the results. No new orders received.

## 2018-01-25 NOTE — H&P (Signed)
NACHUM DEROSSETT is an 63 y.o. male.   Chief Complaint: Right arm pain and numbness HPI: Mr. Rickman returns today with numbness in the right arm and more difficulty with activity. He is also having more neck pain. He is noticed some symptoms occurring on the right side throughout the right arm. He had a nerve conduction study previously that did reveal a C8 radiculopathy on the right. He has previously tried injections, therapy, medications without any significant relief. He has noticed that he is having more difficulty with his home and work activities given the right hand numbness.   Past Medical History:  Diagnosis Date  . Arthritis   . Benign neoplasm of ascending colon   . Degenerative disc disease, cervical   . Esophagitis, reflux 09/29/2005  . Frequency of urination   . GERD (gastroesophageal reflux disease)   . Peyronie's disease    W/ PENILE CURVATURE    Past Surgical History:  Procedure Laterality Date  . COLONOSCOPY  06/2004   Done by Dr. Bary Castilla. No polyps  . COLONOSCOPY WITH PROPOFOL N/A 09/14/2014   Procedure: COLONOSCOPY WITH PROPOFOL;  Surgeon: Lucilla Lame, MD;  Location: [place] [facility];  Service: Endoscopy;  Laterality: N/A;  . HERNIA REPAIR Bilateral 04/09/2015   Bard 3D Max mesh for bilateral inguinal hernias, primary repair of umbilical henria.   . INGUINAL HERNIA REPAIR Bilateral 04/09/2015   Procedure: LAPAROSCOPIC INGUINAL HERNIA;  Surgeon: Robert Bellow, MD;  Location: [facility];  Service: General;  Laterality: Bilateral;  . NESBIT PROCEDURE N/A 06/28/2012   Procedure: 16 DOT PLACTATION PROCEDURE;  Surgeon: Claybon Jabs, MD;  Location: [facility];  Service: Urology;  Laterality: N/A;  . Peyronies Disease  06/2012  . POLYPECTOMY  09/14/2014   Procedure: POLYPECTOMY;  Surgeon: Lucilla Lame, MD;  Location: [place];  Service: Endoscopy;;  . PROSTATE BIOPSY  03-02-15   neg  . UMBILICAL HERNIA REPAIR N/A 04/09/2015   Procedure: HERNIA REPAIR  UMBILICAL ADULT;  Surgeon: Robert Bellow, MD;  Location: [facility];  Service: General;  Laterality: N/A;    Family History  Problem Relation Age of Onset  . Prostate cancer Neg Hx   . Bladder Cancer Neg Hx   . Kidney cancer Neg Hx    Social History:  reports that he has never smoked. He has never used smokeless tobacco. He reports that he does not drink alcohol or use drugs.  Allergies: No Known Allergies  Medications Prior to Admission  Medication Sig Dispense Refill  . sildenafil (REVATIO) 20 MG tablet Take 1 - 5 tabs PO daily prn (Patient taking differently: Take 20-100 mg by mouth daily as needed (ED). Take 1 - 5 tabs PO daily prn) 30 tablet 3    No results found for this or any previous visit (from the past 48 hour(s)). No results found.  ROS General ROS: Negative Respiratory ROS: Negative Cardiovascular ROS: Negative Gastrointestinal ROS: Negative Genito-Urinary ROS: Negative Musculoskeletal ROS: Positive for neck and back pain Neurological ROS: Positive for right hand numbness and pain Dermatological ROS: Negative  Blood pressure (!) 141/99, pulse 62, temperature 98.2 F (36.8 C), temperature source Oral, resp. rate 14, height 6\' 3"  (1.905 m), weight 86.6 kg, SpO2 100 %. Physical Exam  General appearance: Alert, cooperative, in no acute distress Neck: Good range of motion CV: Regular rate Pulm: Clear to auscultation   Neurologic exam:  Mental status: alertness: alert, affect: normal Speech: fluent and clear Motor: Strength 5 out of  5 in bilateral upper extremities with exception of 4+ out of 5 in grip on the right Sensory: intact to light touch in bilateral extremities Gait: normal   Assessment/Plan Plan for C7/T1 ACDF today  Deetta Perla, MD 01/25/2018, 8:20 AM

## 2018-01-25 NOTE — Anesthesia Preprocedure Evaluation (Signed)
Anesthesia Evaluation  Patient identified by MRN, date of birth, ID band Patient awake    Reviewed: Allergy & Precautions, NPO status , Patient's Chart, lab work & pertinent test results  History of Anesthesia Complications Negative for: history of anesthetic complications  Airway Mallampati: II  TM Distance: >3 FB Neck ROM: Full    Dental no notable dental hx.    Pulmonary neg pulmonary ROS, neg sleep apnea, neg COPD,    breath sounds clear to auscultation- rhonchi (-) wheezing      Cardiovascular Exercise Tolerance: Good (-) hypertension(-) CAD, (-) Past MI, (-) Cardiac Stents and (-) CABG  Rhythm:Regular Rate:Normal - Systolic murmurs and - Diastolic murmurs    Neuro/Psych neg Seizures negative neurological ROS  negative psych ROS   GI/Hepatic Neg liver ROS, GERD  ,  Endo/Other  negative endocrine ROSneg diabetes  Renal/GU negative Renal ROS     Musculoskeletal  (+) Arthritis ,   Abdominal (+) - obese,   Peds  Hematology negative hematology ROS (+)   Anesthesia Other Findings Past Medical History: No date: Arthritis No date: Benign neoplasm of ascending colon No date: Degenerative disc disease, cervical 09/29/2005: Esophagitis, reflux No date: Frequency of urination No date: GERD (gastroesophageal reflux disease) No date: Peyronie's disease     Comment:  W/ PENILE CURVATURE   Reproductive/Obstetrics                             Anesthesia Physical Anesthesia Plan  ASA: II  Anesthesia Plan: General   Post-op Pain Management:    Induction: Intravenous  PONV Risk Score and Plan: 1 and Ondansetron, Propofol infusion, Midazolam and TIVA  Airway Management Planned: Oral ETT  Additional Equipment:   Intra-op Plan:   Post-operative Plan: Extubation in OR  Informed Consent: I have reviewed the patients History and Physical, chart, labs and discussed the procedure including  the risks, benefits and alternatives for the proposed anesthesia with the patient or authorized representative who has indicated his/her understanding and acceptance.     Dental advisory given  Plan Discussed with: CRNA and Anesthesiologist  Anesthesia Plan Comments:         Anesthesia Quick Evaluation

## 2018-01-26 ENCOUNTER — Observation Stay: Payer: BC Managed Care – PPO

## 2018-01-26 ENCOUNTER — Encounter: Payer: Self-pay | Admitting: Family Medicine

## 2018-01-26 DIAGNOSIS — M5013 Cervical disc disorder with radiculopathy, cervicothoracic region: Secondary | ICD-10-CM | POA: Diagnosis not present

## 2018-01-26 LAB — CK: Total CK: 229 U/L (ref 49–397)

## 2018-01-26 LAB — TROPONIN I: Troponin I: <0.03 ng/mL (ref <0.03)

## 2018-01-26 MED ORDER — IOHEXOL 350 MG/ML SOLN
75.0000 mL | Freq: Once | INTRAVENOUS | Status: AC | PRN
  Administered 2018-01-26: 75 mL via INTRAVENOUS

## 2018-01-26 MED ORDER — OXYCODONE HCL 5 MG PO TABS: 5.0000 mg | ORAL_TABLET | ORAL | 0 refills | Status: DC | PRN

## 2018-01-26 MED ORDER — METHOCARBAMOL 500 MG PO TABS: 500.0000 mg | ORAL_TABLET | Freq: Four times a day (QID) | ORAL | 0 refills | Status: DC | PRN

## 2018-01-26 NOTE — H&P (Signed)
[facility] NAME: Micheal Mccoy    MR#:  [id number]  DATE OF BIRTH:  1955/12/02  DATE OF ADMISSION:  01/25/2018  PRIMARY CARE PHYSICIAN: [name], MD   REQUESTING/REFERRING PHYSICIAN: [name], MD  CHIEF COMPLAINT:  No chief complaint on file.   HISTORY OF PRESENT ILLNESS: Micheal Mccoy  is a 63 y.o. male with a known history of spinal arthritis, GERD who underwent surgery to the C-spine yesterday.  Patient started having substernal sharp chest pains especially with deep breathing and moving around.  Patient also states that he gets short of breath when he tries to take deep breaths.  EKG showed normal sinus rhythm without any EKG changes.  His cardiac enzymes are negative.  To coronary artery disease or any chest pain prior to surgery.    PAST MEDICAL HISTORY:   Past Medical History:  Diagnosis Date  . Arthritis   . Benign neoplasm of ascending colon   . Degenerative disc disease, cervical   . Esophagitis, reflux 09/29/2005  . Frequency of urination   . GERD (gastroesophageal reflux disease)   . Peyronie's disease    W/ PENILE CURVATURE    PAST SURGICAL HISTORY:  Past Surgical History:  Procedure Laterality Date  . ANTERIOR CERVICAL DECOMP/DISCECTOMY FUSION N/A 01/25/2018   Procedure: ANTERIOR CERVICAL DECOMPRESSION/DISCECTOMY FUSION 1 LEVEL C7-T1, T1-T2;  Surgeon: Deetta Perla, MD;  Location: [facility];  Service: Neurosurgery;  Laterality: N/A;  . ANTERIOR CERVICAL DISCECTOMY  01/25/2018   Dr. Lacinda Axon, ACDF ARTHRODESIS, OSTEOPHYTECTOMY &DECOMPRESSION OF SPINAL CORD AND/ORNERVE ROOTS; BELOW C2--C7-T1  . COLONOSCOPY  06/2004   Done by Dr. Bary Castilla. No polyps  . COLONOSCOPY WITH PROPOFOL N/A 09/14/2014   Procedure: COLONOSCOPY WITH PROPOFOL;  Surgeon: Lucilla Lame, MD;  Location: [place];  Service: Endoscopy;  Laterality: N/A;  . HERNIA REPAIR Bilateral 04/09/2015   Bard 3D Max mesh for bilateral inguinal hernias,  primary repair of umbilical henria.   . INGUINAL HERNIA REPAIR Bilateral 04/09/2015   Procedure: LAPAROSCOPIC INGUINAL HERNIA;  Surgeon: Robert Bellow, MD;  Location: [facility];  Service: General;  Laterality: Bilateral;  . NESBIT PROCEDURE N/A 06/28/2012   Procedure: 16 DOT PLACTATION PROCEDURE;  Surgeon: Claybon Jabs, MD;  Location: [facility];  Service: Urology;  Laterality: N/A;  . Peyronies Disease  06/2012  . POLYPECTOMY  09/14/2014   Procedure: POLYPECTOMY;  Surgeon: Lucilla Lame, MD;  Location: [place];  Service: Endoscopy;;  . PROSTATE BIOPSY  03-02-15   neg  . THORACIC DISCECTOMY  01/25/2018   Dr. Lacinda Axon, ARTHRODESIS ANTERIOR INTERBODY W/DISCECTOMY ADDITIONAL LEVEL T1-T2  . UMBILICAL HERNIA REPAIR N/A 04/09/2015   Procedure: HERNIA REPAIR UMBILICAL ADULT;  Surgeon: Robert Bellow, MD;  Location: [facility];  Service: General;  Laterality: N/A;    SOCIAL HISTORY:  Social History   Tobacco Use  . Smoking status: Never Smoker  . Smokeless tobacco: Never Used  Substance Use Topics  . Alcohol use: No    FAMILY HISTORY:  Family History  Problem Relation Age of Onset  . Prostate cancer Neg Hx   . Bladder Cancer Neg Hx   . Kidney cancer Neg Hx     DRUG ALLERGIES: No Known Allergies  REVIEW OF SYSTEMS:   CONSTITUTIONAL: No fever, fatigue or weakness.  EYES: No blurred or double vision.  EARS, NOSE, AND THROAT: No tinnitus or ear pain.  RESPIRATORY: No cough, shortness of breath, wheezing or hemoptysis.  CARDIOVASCULAR: Positive chest pain with deep breathing, orthopnea, edema.  GASTROINTESTINAL: No nausea, vomiting, diarrhea or abdominal pain.  GENITOURINARY: No dysuria, hematuria.  ENDOCRINE: No polyuria, nocturia,  HEMATOLOGY: No anemia, easy bruising or bleeding SKIN: No rash or lesion. MUSCULOSKELETAL: No joint pain or arthritis.   NEUROLOGIC: No tingling, numbness, weakness.  PSYCHIATRY: No anxiety or depression.   MEDICATIONS AT HOME:   Prior to Admission medications   Medication Sig Start Date End Date Taking? Authorizing Provider  sildenafil (REVATIO) 20 MG tablet Take 1 - 5 tabs PO daily prn Patient taking differently: Take 20-100 mg by mouth daily as needed (ED). Take 1 - 5 tabs PO daily prn 11/30/17  Yes Billey Co, MD      PHYSICAL EXAMINATION:   VITAL SIGNS: Blood pressure 136/89, pulse 73, temperature 97.9 F (36.6 C), temperature source Oral, resp. rate 18, height 6\' 3"  (1.905 m), weight 86.6 kg, SpO2 99 %.  GENERAL:  63 y.o.-year-old patient lying in the bed with no acute distress.  EYES: Pupils equal, round, reactive to light and accommodation. No scleral icterus. Extraocular muscles intact.  HEENT: Head atraumatic, normocephalic. Oropharynx and nasopharynx clear.  NECK:  Supple, no jugular venous distention. No thyroid enlargement, no tenderness.  LUNGS: Normal breath sounds bilaterally, no wheezing, rales,rhonchi or crepitation. No use of accessory muscles of respiration.  CARDIOVASCULAR: S1, S2 normal. No murmurs, rubs, or gallops.  ABDOMEN: Soft, nontender, nondistended. Bowel sounds present. No organomegaly or mass.  EXTREMITIES: No pedal edema, cyanosis, or clubbing.  NEUROLOGIC: Cranial nerves II through XII are intact. Muscle strength 5/5 in all extremities. Sensation intact. Gait not checked.  PSYCHIATRIC: The patient is alert and oriented x 3.  SKIN: No obvious rash, lesion, or ulcer.   LABORATORY PANEL:   CBC No results for input(s): WBC, HGB, HCT, PLT, MCV, MCH, MCHC, RDW, LYMPHSABS, MONOABS, EOSABS, BASOSABS, BANDABS in the last 168 hours.  Invalid input(s): NEUTRABS, BANDSABD ------------------------------------------------------------------------------------------------------------------  Chemistries  Recent Labs  Lab 01/25/18 2054  NA 138  K 4.3  CL 104  CO2 27  GLUCOSE 204*  BUN 10  CREATININE 1.00  CALCIUM 9.1    ------------------------------------------------------------------------------------------------------------------ estimated creatinine clearance is 89.2 mL/min (by C-G formula based on SCr of 1 mg/dL). ------------------------------------------------------------------------------------------------------------------ No results for input(s): TSH, T4TOTAL, T3FREE, THYROIDAB in the last 72 hours.  Invalid input(s): FREET3   Coagulation profile No results for input(s): INR, PROTIME in the last 168 hours. ------------------------------------------------------------------------------------------------------------------- No results for input(s): DDIMER in the last 72 hours. -------------------------------------------------------------------------------------------------------------------  Cardiac Enzymes Recent Labs  Lab 01/26/18 0835  TROPONINI <0.03   ------------------------------------------------------------------------------------------------------------------ Invalid input(s): POCBNP  ---------------------------------------------------------------------------------------------------------------  Urinalysis    Component Value Date/Time   APPEARANCEUR Clear 05/21/2016 1353   GLUCOSEU Negative 05/21/2016 1353   BILIRUBINUR Negative 05/21/2016 1353   PROTEINUR Negative 05/21/2016 1353   UROBILINOGEN 0.2 01/17/2015 1410   NITRITE Negative 05/21/2016 1353   LEUKOCYTESUR Negative 05/21/2016 1353     RADIOLOGY: Dg Chest 1 View  Result Date: 01/25/2018 CLINICAL DATA:  64 year old male with central chest tightness EXAM: CHEST  1 VIEW COMPARISON:  Chest x-ray 01/14/2018 FINDINGS: The lungs are clear and negative for focal airspace consolidation, pulmonary edema or suspicious pulmonary nodule. No pleural effusion or pneumothorax. Cardiac and mediastinal contours are within normal limits. No acute fracture or lytic or blastic osseous lesions. The visualized upper abdominal bowel gas  pattern is unremarkable. Interval surgical changes of C7-T2 anterior spinal fusion. IMPRESSION: No active disease. Electronically Signed   By: Myrle Sheng  Laurence Ferrari M.D.   On: 01/25/2018 20:14   Dg Cervical Spine 1 View  Result Date: 01/25/2018 CLINICAL DATA:  Anterior cervical discectomy and fusion. EXAM: DG CERVICAL SPINE - 1 VIEW COMPARISON:  Earlier today and cervical spine MRI dated 01/05/2018. FINDINGS: A cross-table lateral view of the cervical spine demonstrates and interbody bone plug at the C7-T1 level. The T1-2 level is obscured by the patient's shoulders. Again noted are anterior cervical spurs at multiple levels. IMPRESSION: Interbody bone plug at the C7-T1 level. Electronically Signed   By: Claudie Revering M.D.   On: 01/25/2018 12:16   Dg Cervical Spine 1 View  Result Date: 01/25/2018 CLINICAL DATA:  Anterior cervical discectomy and fusion. EXAM: DG CERVICAL SPINE - 1 VIEW COMPARISON:  Cervical spine MRI dated 01/05/2018. FINDINGS: A cross-table lateral view of the cervical spine demonstrates a metallic localizer with its tip overlying the anterior aspect of the T1-2 disc space. Anterior cervical spine degenerative spur formation is noted at multiple levels. IMPRESSION: Localizer at the T1-2 level. Electronically Signed   By: Claudie Revering M.D.   On: 01/25/2018 12:15   Dg Cervical Spine 1 View  Result Date: 01/25/2018 CLINICAL DATA:  ACDF. EXAM: DG CERVICAL SPINE - 1 VIEW COMPARISON:  MRI 01/05/2018 FINDINGS: Two lateral portable radiographs obtained in the operating room of the cervical spine were submitted. On the first image an endotracheal tube is noted. Multi level degenerative disc disease is again identified. On the subsequent image a surgical probe is identified anterior to the T1 vertebra. IMPRESSION: 1. Lateral portable radiographs of the cervical spine show surgical probe localization at the level of T1. Electronically Signed   By: Kerby Moors M.D.   On: 01/25/2018 12:14   Dg  Cervical Spine 2 Or 3 Views  Result Date: 01/26/2018 CLINICAL DATA:  Status post cervical fusion EXAM: CERVICAL SPINE - 2-3 VIEW COMPARISON:  Radiography from yesterday FINDINGS: C7-T1 and T1-2 anterior fusion with ventral plate. Expected soft tissue gas and swelling in the neck. No evident fracture or hardware displacement. IMPRESSION: C7-T2 anterior fusion.  No unexpected finding. Electronically Signed   By: Monte Fantasia M.D.   On: 01/26/2018 06:50   Ct Angio Chest Pe W Or Wo Contrast  Result Date: 01/26/2018 CLINICAL DATA:  Anterior chest pain and shortness of breath since yesterday when the patient underwent cervical fusion. EXAM: CT ANGIOGRAPHY CHEST WITH CONTRAST TECHNIQUE: Multidetector CT imaging of the chest was performed using the standard protocol during bolus administration of intravenous contrast. Multiplanar CT image reconstructions and MIPs were obtained to evaluate the vascular anatomy. CONTRAST:  75 mL OMNIPAQUE IOHEXOL 350 MG/ML SOLN COMPARISON:  None. FINDINGS: Cardiovascular: Satisfactory opacification of the pulmonary arteries to the segmental level. No evidence of pulmonary embolism. Normal heart size. No pericardial effusion. A few scattered aortic atherosclerotic calcifications are identified. Mediastinum/Nodes: No enlarged mediastinal, hilar, or axillary lymph nodes. Thyroid gland, trachea, and esophagus demonstrate no significant findings. Lungs/Pleura: No pleural or pericardial effusion. Mild dependent atelectasis noted. Upper Abdomen: Two small cysts are seen in the liver. No acute abnormality. Musculoskeletal: No acute or focal abnormality. Cervical fusion hardware is partially imaged. There is some gas in the soft tissues of the neck, greater on the left consistent with recent surgery. Review of the MIP images confirms the above findings. IMPRESSION: Negative for pulmonary embolus. No acute disease or finding to explain the patient's symptoms. Mild aortic atherosclerosis  (ICD10-I70.0). Electronically Signed   By: Inge Rise M.D.   On: 01/26/2018  13:42    EKG: Orders placed or performed during the hospital encounter of 01/25/18  . EKG 12-Lead  . EKG 12-Lead  . EKG 12-Lead  . EKG 12-Lead    IMPRESSION AND PLAN: Patient 63 year old status post C-spine surgery now with chest pain  1.  Chest pain could be pleuritic I discussed with Dr. Lacinda Axon related to his surgery CT per PE shows no evidence of pulmonary embolism or pneumonia Recommend NSAIDs if able to take post this type of surgery I do not feel that this is cardiac in nature.  Based on no previous symptoms prior to surgery as well as his EKG and cardiac enzymes being negative. Okay to discharge home if stable from surgical standpoint    All the records are reviewed and case discussed with ED provider. Management plans discussed with the patient, family and they are in agreement.  CODE STATUS:    Code Status Orders  (From admission, onward)         Start     Ordered   01/25/18 1416  Full code  Continuous     01/25/18 1416        Code Status History    Date Active Date Inactive Code Status Order ID Comments User Context   04/09/2015 1241 04/10/2015 1621 Full Code [id number]  Robert Bellow, MD Inpatient    Advance Directive Documentation     Most Recent Value  Type of Advance Directive  Healthcare Power of [place], Living will  Pre-existing out of facility DNR order (yellow form or pink MOST form)  -  "MOST" Form in Place?  -       TOTAL TIME TAKING CARE OF THIS PATIENT: 55 minutes.    Dustin Flock M.D on 01/26/2018 at 2:08 PM  Between 7am to 6pm - Pager - [phone]  After 6pm go to [email or web address] - password [email or web address]  [facility] Office  [phone]  CC: Primary care physician; [name], MD

## 2018-01-26 NOTE — Progress Notes (Signed)
Procedure: C7-T2 ACDF Procedure date: 01/25/2018 Diagnosis: Cervical and thoracic radiculopathy  History: POD1:  Micheal Mccoy is POD1 s/p C7-T2 ACDF. He is recovering well. Upper extremity pain has resolved and he is already experiencing improvement in ability to write. Numbness persists. Complains of posterior cervical pain that has been adequately controlled with current pain regimen. Has ambulated, voided, and eaten without issue.  Main complaint at this time is anterior chest pain. Described as intermittent sharpness throughout the sternum. Presented after procedure yesterday. Associated with complaints of SOB and sensation of needing to belch. Aggravated with sitting up and lateral head movements. Relieved with laying flat.   Physical Exam: Vitals:   01/25/18 2352 01/26/18 0743  BP: (!) 145/81 136/89  Pulse: 89 73  Resp: 19 18  Temp: 98.7 F (37.1 C) 97.9 F (36.6 C)  SpO2: 100% 99%    AA Ox3 Strength:5/5 throughout upper extremities Sensation: intact and symmetric throughout upper extremities. Skin: Glue intact at incision site.   Data:  Recent Labs  Lab 01/25/18 2054  NA 138  K 4.3  CL 104  CO2 27  BUN 10  CREATININE 1.00  GLUCOSE 204*  CALCIUM 9.1   No results for input(s): AST, ALT, ALKPHOS in the last 168 hours.  Invalid input(s): TBILI   No results for input(s): WBC, HGB, HCT, PLT in the last 168 hours. No results for input(s): APTT, INR in the last 168 hours.       Other tests/results: EXAM: CERVICAL SPINE - 2-3 VIEW  COMPARISON:  Radiography from yesterday  FINDINGS: C7-T1 and T1-2 anterior fusion with ventral plate. Expected soft tissue gas and swelling in the neck. No evident fracture or hardware displacement.  IMPRESSION: C7-T2 anterior fusion.  No unexpected finding.  EXAM: CHEST  1 VIEW  COMPARISON:  Chest x-ray 01/14/2018  FINDINGS: The lungs are clear and negative for focal airspace consolidation, pulmonary edema or  suspicious pulmonary nodule. No pleural effusion or pneumothorax. Cardiac and mediastinal contours are within normal limits. No acute fracture or lytic or blastic osseous lesions. The visualized upper abdominal bowel gas pattern is unremarkable. Interval surgical changes of C7-T2 anterior spinal fusion.  IMPRESSION: No active disease. Assessment/Plan:  [name] is POD1 s/p C7-T2 ACDF. He is recovering well. Post op pain seems appropriately controlled on current medications. Will consult medicine for evaluation of post-op sternal pain complaints. Troponin and CK results within normal limits. No abnormalities noted on Chest xray.    - mobilize - pain control - DVT prophylaxis  Marin Olp PA-C Department of Neurosurgery

## 2018-01-26 NOTE — Progress Notes (Signed)
Discharge instructions and prescriptions given to pt. IVs removed. Pt dressed and has no questions at this time. Pt ready to discharge home with wife.

## 2018-01-26 NOTE — Discharge Summary (Addendum)
Procedure: C7-T2 ACDF Procedure date: 01/25/2018 Diagnosis: Cervical and thoracic radiculopathy  History: POD1:   Update: Patient continues to do well. He was evaluated by hospitalist. Feels pain is gradually improving. Continues to complain of posterior cervical soreness.    Micheal Mccoy is POD1 s/p C7-T2 ACDF. He is recovering well. Upper extremity pain has resolved and he is already experiencing improvement in ability to write. Numbness persists. Complains of posterior cervical pain that has been adequately controlled with current pain regimen. Has ambulated, voided, and eaten without issue.  Main complaint at this time is anterior chest pain. Described as intermittent sharpness throughout the sternum. Presented after procedure yesterday. Associated with complaints of SOB and sensation of needing to belch. Aggravated with sitting up and lateral head movements. Relieved with laying flat.   Physical Exam: Vitals:   01/26/18 0743 01/26/18 1530  BP: 136/89 (!) 141/77  Pulse: 73 77  Resp: 18 18  Temp: 97.9 F (36.6 C) 98.8 F (37.1 C)  SpO2: 99% 99%    AA Ox3 Strength:5/5 throughout upper extremities Sensation: intact and symmetric throughout upper extremities. Skin: Glue intact at incision site.   Data:  Recent Labs  Lab 01/25/18 2054  NA 138  K 4.3  CL 104  CO2 27  BUN 10  CREATININE 1.00  GLUCOSE 204*  CALCIUM 9.1   No results for input(s): AST, ALT, ALKPHOS in the last 168 hours.  Invalid input(s): TBILI   No results for input(s): WBC, HGB, HCT, PLT in the last 168 hours. No results for input(s): APTT, INR in the last 168 hours.       Other tests/results: EXAM: CERVICAL SPINE - 2-3 VIEW  COMPARISON:  Radiography from yesterday  FINDINGS: C7-T1 and T1-2 anterior fusion with ventral plate. Expected soft tissue gas and swelling in the neck. No evident fracture or hardware displacement.  IMPRESSION: C7-T2 anterior fusion.  No unexpected  finding.  EXAM: CHEST  1 VIEW  COMPARISON:  Chest x-ray 01/14/2018  FINDINGS: The lungs are clear and negative for focal airspace consolidation, pulmonary edema or suspicious pulmonary nodule. No pleural effusion or pneumothorax. Cardiac and mediastinal contours are within normal limits. No acute fracture or lytic or blastic osseous lesions. The visualized upper abdominal bowel gas pattern is unremarkable. Interval surgical changes of C7-T2 anterior spinal fusion.  IMPRESSION: No active disease.  EXAM: CT ANGIOGRAPHY CHEST WITH CONTRAST  TECHNIQUE: Multidetector CT imaging of the chest was performed using the standard protocol during bolus administration of intravenous contrast. Multiplanar CT image reconstructions and MIPs were obtained to evaluate the vascular anatomy.  CONTRAST:  75 mL OMNIPAQUE IOHEXOL 350 MG/ML SOLN  COMPARISON:  None.  FINDINGS: Cardiovascular: Satisfactory opacification of the pulmonary arteries to the segmental level. No evidence of pulmonary embolism. Normal heart size. No pericardial effusion. A few scattered aortic atherosclerotic calcifications are identified.  Mediastinum/Nodes: No enlarged mediastinal, hilar, or axillary lymph nodes. Thyroid gland, trachea, and esophagus demonstrate no significant findings.  Lungs/Pleura: No pleural or pericardial effusion. Mild dependent atelectasis noted.  Upper Abdomen: Two small cysts are seen in the liver. No acute abnormality.  Musculoskeletal: No acute or focal abnormality. Cervical fusion hardware is partially imaged. There is some gas in the soft tissues of the neck, greater on the left consistent with recent surgery.  Review of the MIP images confirms the above findings.  IMPRESSION: Negative for pulmonary embolus. No acute disease or finding to explain the patient's symptoms.  Mild aortic atherosclerosis (ICD10-I70.0).  Assessment/Plan:  Micheal Kindred  E Mccoy is POD1 s/p  C7-T2 ACDF. He is recovering well. Chest pain workup negative for cardiac pathology or PE. Will continue post op pain control with tylenol, oxycodone, and robaxin as needed. Advised to take ibuprofen for 1 week for pleuritic pain. Educated patient on wound care and activity restrictions. He will follow up in clinic in 2 weeks to monitor progress. Advised to contact office if any questions or concerns arise before then.   Medicine consult appreciated.   Marin Olp PA-C Department of Neurosurgery

## 2018-01-26 NOTE — Discharge Instructions (Signed)
Your surgeon has performed an operation on your cervical spine (neck) to relieve pressure on the spinal cord and/or nerves. This involved making an incision in the front of your neck and removing one or more of the discs that support your spine. Next, a small piece of bone, a titanium plate, and screws were used to fuse two or more of the vertebrae (bones) together.  The following are instructions to help in your recovery once you have been discharged from the hospital. Even if you feel well, it is important that you follow these activity guidelines. If you do not let your neck heal properly from the surgery, you can increase the chance of return of your symptoms and other complications.  * Do not take anti-inflammatory medications for 3 months after surgery (naproxen [Aleve], ibuprofen [Advil, Motrin], celecoxib [Celebrex], etc.). These medications can prevent your bones from healing properly.  You may take ibuprofen for 1 week in to help with sternal chest pain/pleuritis pain  Activity    No bending, lifting, or twisting (BLT). Avoid lifting objects heavier than 10 pounds (gallon milk jug).  Where possible, avoid household activities that involve lifting, bending, reaching, pushing, or pulling such as laundry, vacuuming, grocery shopping, and childcare. Try to arrange for help from friends and family for these activities while your back heals.  Increase physical activity slowly as tolerated.  Taking short walks is encouraged, but avoid strenuous exercise. Do not jog, run, bicycle, lift weights, or participate in any other exercises unless specifically allowed by your doctor.  Talk to your doctor before resuming sexual activity.  You should not drive until cleared by your doctor.  Until released by your doctor, you should not return to work or school.  You should rest at home and let your body heal.   You may shower three days after your surgery.  After showering, lightly dab your incision  dry. Do not take a tub bath or go swimming until approved by your doctor at your follow-up appointment.  If your doctor ordered a cervical collar (neck brace) for you, you should wear it whenever you are out of bed. You may remove it when lying down or sleeping, but you should wear it at all other times. Not all neck surgeries require a cervical collar.  If you smoke, we strongly recommend that you quit.  Smoking has been proven to interfere with normal bone healing and will dramatically reduce the success rate of your surgery. Please contact [facility] (800-QUIT-NOW) and use the resources at www.[facility].com for assistance in stopping smoking.  Surgical Incision   If you have a dressing on your incision, you may remove it two days after your surgery. Keep your incision area clean and dry.  If you have staples or stitches on your incision, you should have a follow up scheduled for removal. If you do not have staples or stitches, you will have steri-strips (small pieces of surgical tape) or Dermabond glue. The steri-strips/glue should begin to peel away within about a week (it is fine if the steri-strips fall off before then). If the strips are still in place one week after your surgery, you may gently remove them.  Diet           You may return to your usual diet. However, you may experience discomfort when swallowing in the first month after your surgery. This is normal. You may find that softer foods are more comfortable for you to swallow. Be sure to stay hydrated.  When  to Contact us  You may experience pain in your neck and/or pain between your shoulder blades. This is normal and should improve in the next few weeks with the help of pain medication, muscle relaxers, and rest. Some patients report that a warm compress on the back of the neck or between the shoulder blades helps.  However, should you experience any of the following, contact us immediately:  New numbness or weakness  Pain  that is progressively getting worse, and is not relieved by your pain medication, muscle relaxers, rest, and warm compresses  Bleeding, redness, swelling, pain, or drainage from surgical incision  Chills or flu-like symptoms  Fever greater than 101.0 F (38.3 C)  Inability to eat, drink fluids, or take medications  Problems with bowel or bladder functions  Difficulty breathing or shortness of breath  Warmth, tenderness, or swelling in your calf Contact Information  During office hours (Monday-Friday 9 am to 5 pm), please call your physician at [phone] and ask for Berdine Addison  After hours and weekends, please call the [facility] Operator at [phone] and ask for the Neurosurgery Resident On Call   For a life-threatening emergency, call 911

## 2018-01-27 ENCOUNTER — Encounter: Payer: Self-pay | Admitting: Neurosurgery

## 2018-06-16 ENCOUNTER — Telehealth: Payer: Self-pay | Admitting: Family Medicine

## 2018-06-16 NOTE — Telephone Encounter (Signed)
Pt called saying his employer has ask him to present something from his physician saying he is high risk for Covid.  He would like to Dr. Caryn Section to write me up something   CB#  [phone]  Thanks Con Memos

## 2018-06-18 NOTE — Telephone Encounter (Signed)
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  If you agree please send the note to his Mychart so he can print it out.    Thanks,   -Mickel Baas

## 2018-06-18 NOTE — Telephone Encounter (Signed)
He's at average risk for his age, he doesn't have any medical conditions that put him at a higher risk. Why does he want a statement stating he is at high risk?

## 2018-06-23 NOTE — Telephone Encounter (Signed)
Letter sent through mychart.

## 2018-11-16 ENCOUNTER — Ambulatory Visit (INDEPENDENT_AMBULATORY_CARE_PROVIDER_SITE_OTHER): Payer: BC Managed Care – PPO | Admitting: Family Medicine

## 2018-11-16 ENCOUNTER — Other Ambulatory Visit: Payer: Self-pay

## 2018-11-16 ENCOUNTER — Encounter: Payer: Self-pay | Admitting: Family Medicine

## 2018-11-16 VITALS — BP 120/80 | HR 63 | Temp 97.3°F | Resp 16 | Ht 75.0 in | Wt 190.0 lb

## 2018-11-16 DIAGNOSIS — Z0000 Encounter for general adult medical examination without abnormal findings: Secondary | ICD-10-CM | POA: Diagnosis not present

## 2018-11-16 DIAGNOSIS — Z23 Encounter for immunization: Secondary | ICD-10-CM

## 2018-11-16 DIAGNOSIS — R350 Frequency of micturition: Secondary | ICD-10-CM

## 2018-11-16 DIAGNOSIS — Z981 Arthrodesis status: Secondary | ICD-10-CM

## 2018-11-16 DIAGNOSIS — Z125 Encounter for screening for malignant neoplasm of prostate: Secondary | ICD-10-CM | POA: Diagnosis not present

## 2018-11-16 DIAGNOSIS — Z1159 Encounter for screening for other viral diseases: Secondary | ICD-10-CM

## 2018-11-16 NOTE — Progress Notes (Signed)
Patient: Micheal Mccoy, Male    DOB: [birth year], 63 y.o.   MRN: [id number] Visit Date: 11/16/2018  Today's Provider: Lelon Huh, MD   Chief Complaint  Patient presents with  . Annual Exam   Subjective:     Complete Physical Micheal Mccoy is a 63 y.o. male. He feels well. He reports exercising daily. He reports he is sleeping fairly well.  He is having some numbness in his Mccoy arm. He had cervical discectomy in January by Dr. Lacinda Axon and states numbness had resolved for several months, but has started bothering him again recently.  He does reports having occasional episode of rectal bleeding associated with hemorrhoids. He did have tubular adenoma by Dr. Allen Norris in 2016 and will be due for follow up colonoscopy next year.  -----------------------------------------------------------  He also states he has been having much more trouble with urinary hesitancy and frequency, having to get up several times a night and trouble with dribbling at end of urination. He is followed by urology and has follow up scheduled with Dr. Diamantina Providence in December.   Review of Systems  Constitutional: Negative for appetite change, chills, fatigue and fever.  HENT: Negative for congestion, ear pain, hearing loss, nosebleeds and trouble swallowing.   Eyes: Negative for pain and visual disturbance.  Respiratory: Negative for cough, chest tightness and shortness of breath.   Cardiovascular: Negative for chest pain, palpitations and leg swelling.  Gastrointestinal: Positive for blood in stool. Negative for abdominal pain, constipation, diarrhea, nausea and vomiting.  Endocrine: Positive for polyuria. Negative for polydipsia and polyphagia.  Genitourinary: Positive for decreased urine volume, dysuria, frequency and urgency. Negative for flank pain.  Musculoskeletal: Positive for arthralgias. Negative for back pain, joint swelling, myalgias and neck stiffness.  Skin: Negative for color change, rash and wound.   Neurological: Positive for numbness. Negative for dizziness, tremors, seizures, speech difficulty, weakness, light-headedness and headaches.  Psychiatric/Behavioral: Negative for behavioral problems, confusion, decreased concentration, dysphoric mood and sleep disturbance. The patient is not nervous/anxious.   All other systems reviewed and are negative.    Social History   Socioeconomic History  . Marital status: Married    Spouse name: Not on file  . Number of children: 3  . Years of education: Not on file  . Highest education level: Not on file  Occupational History  . Occupation: Writer: [employer]  . Financial resource strain: Not on file  . Food insecurity    Worry: Not on file    Inability: Not on file  . Transportation needs    Medical: Not on file    Non-medical: Not on file  Tobacco Use  . Smoking status: Never Smoker  . Smokeless tobacco: Never Used  Substance and Sexual Activity  . Alcohol use: No  . Drug use: No  . Sexual activity: Yes  Lifestyle  . Physical activity    Days per week: Not on file    Minutes per session: Not on file  . Stress: Not on file  Relationships  . Social Herbalist on phone: Not on file    Gets together: Not on file    Attends religious service: Not on file    Active member of club or organization: Not on file    Attends meetings of clubs or organizations: Not on file    Relationship status: Not on file  . Intimate partner violence  Fear of current or ex partner: Not on file    Emotionally abused: Not on file    Physically abused: Not on file    Forced sexual activity: Not on file  Other Topics Concern  . Not on file  Social History Narrative  . Not on file    Past Medical History:  Diagnosis Date  . Arthritis   . Benign neoplasm of ascending colon   . Degenerative disc disease, cervical   . Esophagitis, reflux 09/29/2005  . Frequency of urination   . GERD  (gastroesophageal reflux disease)   . Peyronie's disease    W/ PENILE CURVATURE     Patient Active Problem List   Diagnosis Date Noted  . S/P cervical spinal fusion 01/25/2018  . Prostate enlargement 02/23/2015  . DDD (degenerative disc disease), lumbar 01/18/2015  . Urinary frequency 01/17/2015  . Neck pain, chronic 01/17/2015  . History of adenomatous polyp of colon   . Arthritis 08/24/2014  . BPH (benign prostatic hypertrophy) 08/24/2014  . Erectile dysfunction 08/24/2014  . Peyronie's disease 06/28/2012  . Cervicalgia 07/12/2007  . Dizziness and giddiness 08/18/2006  . Esophagitis, reflux 09/29/2005  . Cervical spondylosis without myelopathy 01/06/1998    Past Surgical History:  Procedure Laterality Date  . ANTERIOR CERVICAL DECOMP/DISCECTOMY FUSION N/A 01/25/2018   Procedure: ANTERIOR CERVICAL DECOMPRESSION/DISCECTOMY FUSION 1 LEVEL C7-T1, T1-T2;  Surgeon: Deetta Perla, MD;  Location: [facility];  Service: Neurosurgery;  Laterality: N/A;  . ANTERIOR CERVICAL DISCECTOMY  01/25/2018   Dr. Lacinda Axon, ACDF ARTHRODESIS, OSTEOPHYTECTOMY &DECOMPRESSION OF SPINAL CORD AND/ORNERVE ROOTS; BELOW C2--C7-T1  . COLONOSCOPY  06/2004   Done by Dr. Bary Castilla. No polyps  . COLONOSCOPY WITH PROPOFOL N/A 09/14/2014   Procedure: COLONOSCOPY WITH PROPOFOL;  Surgeon: Lucilla Lame, MD;  Location: [place];  Service: Endoscopy;  Laterality: N/A;  . HERNIA REPAIR Bilateral 04/09/2015   Bard 3D Max mesh for bilateral inguinal hernias, primary repair of umbilical henria.   . INGUINAL HERNIA REPAIR Bilateral 04/09/2015   Procedure: LAPAROSCOPIC INGUINAL HERNIA;  Surgeon: Robert Bellow, MD;  Location: [facility];  Service: General;  Laterality: Bilateral;  . NESBIT PROCEDURE N/A 06/28/2012   Procedure: 16 DOT PLACTATION PROCEDURE;  Surgeon: Claybon Jabs, MD;  Location: [facility];  Service: Urology;  Laterality: N/A;  . Peyronies Disease  06/2012  . POLYPECTOMY  09/14/2014   Procedure:  POLYPECTOMY;  Surgeon: Lucilla Lame, MD;  Location: [place];  Service: Endoscopy;;  . PROSTATE BIOPSY  03-02-15   neg  . THORACIC DISCECTOMY  01/25/2018   Dr. Lacinda Axon, ARTHRODESIS ANTERIOR INTERBODY W/DISCECTOMY ADDITIONAL LEVEL T1-T2  . UMBILICAL HERNIA REPAIR N/A 04/09/2015   Procedure: HERNIA REPAIR UMBILICAL ADULT;  Surgeon: Robert Bellow, MD;  Location: [facility];  Service: General;  Laterality: N/A;    His family history is negative for Prostate cancer, Bladder Cancer, and Kidney cancer.   Current Outpatient Medications:  .  sildenafil (REVATIO) 20 MG tablet, Take 1 - 5 tabs PO daily prn (Patient not taking: Reported on 11/16/2018), Disp: 30 tablet, Rfl: 3  Patient Care Team: [name], MD as PCP - General (Family Medicine) Bary Castilla, Forest Gleason, MD as Consulting Physician (General Surgery) Nickie Retort, MD as Consulting Physician (Urology) Deetta Perla, MD as Consulting Physician (Neurosurgery)     Objective:    Vitals: BP 120/80 (BP Location: Left Arm, Patient Position: Sitting, Cuff Size: Large)   Pulse 63   Temp (!) 97.3 F (36.3 C) (Temporal)  Resp 16   Ht 6\' 3"  (1.905 m)   Wt 190 lb (86.2 kg)   BMI 23.75 kg/m   Physical Exam   General Appearance:    Well developed, well nourished male. Alert, cooperative, in no acute distress, appears stated age  Head:    Normocephalic, without obvious abnormality, atraumatic  Eyes:    PERRL, conjunctiva/corneas clear, EOM's intact, fundi    benign, both eyes       Ears:    Normal TM's and external ear canals, both ears  Nose:   Nares normal, septum midline, mucosa normal, no drainage   or sinus tenderness  Throat:   Lips, mucosa, and tongue normal; teeth and gums normal  Neck:   Supple, symmetrical, trachea midline, no adenopathy;       thyroid:  No enlargement/tenderness/nodules; no carotid   bruit or JVD  Back:     Symmetric, no curvature, ROM normal, no CVA tenderness  Lungs:     Clear to  auscultation bilaterally, respirations unlabored  Chest wall:    No tenderness or deformity  Heart:    Normal heart rate. Normal rhythm. No murmurs, rubs, or gallops.  S1 and S2 normal  Abdomen:     Soft, non-tender, bowel sounds active all four quadrants,    no masses, no organomegaly  Genitalia:    deferred  Rectal:    deferred  Extremities:   All extremities are intact. No cyanosis or edema  Pulses:   2+ and symmetric all extremities  Skin:   Skin color, texture, turgor normal, no rashes or lesions  Lymph nodes:   Cervical, supraclavicular, and axillary nodes normal  Neurologic:   CNII-XII intact. Normal strength, sensation and reflexes      throughout    Activities of Daily Living In your present state of health, do you have any difficulty performing the following activities: 11/16/2018 01/25/2018  Hearing? [place]? Y -  Difficulty concentrating or making decisions? N -  Walking or climbing stairs? N -  Dressing or bathing? N -  Doing errands, shopping? N N  Some recent data might be hidden    Fall Risk Assessment Fall Risk  11/16/2018 12/28/2015  Falls in the past year? 0 No  Number falls in past yr: 0 -  Injury with Fall? 0 -  Follow up Falls evaluation completed -     Depression Screen PHQ 2/9 Scores 11/16/2018 07/28/2017 12/28/2015 08/25/2014  PHQ - 2 Score 3 3 0 0  PHQ- 9 Score 5 5 0 0    Cognitive Testing - 6-CIT  Correct? Score   What year is it? yes 0 0 or 4  What month is it? yes 0 0 or 3  Memorize:    Pia Mau,  40,  [place],      What time is it? (within 1 hour) yes 0 0 or 3  Count backwards from 20 yes 0 0, 2, or 4  Name the months of the year yes 0 0, 2, or 4  Repeat name & address above no 3 0, 2, 4, 6, 8, or 10       TOTAL SCORE  3/28   Interpretation:  Normal  Normal (0-7) Abnormal (8-28)   No flowsheet data found.     Assessment & Plan:    Annual Physical Reviewed patient's Family Medical History Reviewed and updated  list of patient's medical providers Assessment of cognitive impairment was done Assessed patient's functional ability  Established a written schedule for health screening [place] Completed and Reviewed  Exercise Activities and Dietary recommendations Goals   None     Immunization History  Administered Date(s) Administered  . Influenza,inj,Quad PF,6+ Mos 12/28/2015  . Tdap 03/22/2013    Health Maintenance  Topic Date Due  . Hepatitis C Screening  13-Jan-1955  . HIV Screening  08/05/1968  . PNA vac Low Risk Adult (1 of 2 - PCV13) 08/06/2018  . INFLUENZA VACCINE  08/07/2018  . COLONOSCOPY  09/14/2019  . TETANUS/TDAP  03/23/2023     Discussed health benefits of physical activity, and encouraged him to engage in regular exercise appropriate for his age and condition.    ------------------------------------------------------------------------------------------------------------  1. Annual physical exam - Comprehensive metabolic panel - Lipid panel (fasting)  2. Urinary frequency Likely BPH. Consider trial of tamsulosin if PSA is normal. He does have appointment wschedued with Dr. Glori Luis next month.   3. Prostate cancer screening   - PSA Total (Reflex To Free)  4. Need for pneumococcal vaccination  - Pneumovax-23  5. Need for hepatitis C screening test  - Hepatitis C antibody  6. Need for shingles vaccine  - Varicella-zoster vaccine IM (Shingrix)  7. Need for influenza vaccination  - Flu Vaccine QUAD High Dose(Fluad)  8 s/p cervical spinal fusion He has been having some occasional left arm numbness, if progresses will need to get back in with Dr. Lacinda Axon.    The entirety of the information documented in the History of Present Illness, Review of Systems and Physical Exam were personally obtained by me. Portions of this information were initially documented by Meyer Cory, CMA and reviewed by me for thoroughness and accuracy.        Lelon Huh, MD  [facility]

## 2018-11-17 ENCOUNTER — Telehealth: Payer: Self-pay

## 2018-11-17 ENCOUNTER — Other Ambulatory Visit: Payer: Self-pay | Admitting: Family Medicine

## 2018-11-17 DIAGNOSIS — Z125 Encounter for screening for malignant neoplasm of prostate: Secondary | ICD-10-CM

## 2018-11-17 LAB — COMPREHENSIVE METABOLIC PANEL
ALT: 16 IU/L (ref 0–44)
AST: 15 IU/L (ref 0–40)
Albumin/Globulin Ratio: 1.6 (ref 1.2–2.2)
Albumin: 4.4 g/dL (ref 3.8–4.8)
Alkaline Phosphatase: 60 IU/L (ref 39–117)
BUN/Creatinine Ratio: 14 (ref 10–24)
BUN: 13 mg/dL (ref 8–27)
Bilirubin Total: 0.4 mg/dL (ref 0.0–1.2)
CO2: 24 mmol/L (ref 20–29)
Calcium: 9.5 mg/dL (ref 8.6–10.2)
Chloride: 104 mmol/L (ref 96–106)
Creatinine, Ser: 0.9 mg/dL (ref 0.76–1.27)
GFR calc Af Amer: 103 mL/min/{1.73_m2} (ref >59)
GFR calc non Af Amer: 89 mL/min/{1.73_m2} (ref >59)
Globulin, Total: 2.7 g/dL (ref 1.5–4.5)
Glucose: 87 mg/dL (ref 65–99)
Potassium: 4.4 mmol/L (ref 3.5–5.2)
Sodium: 142 mmol/L (ref 134–144)
Total Protein: 7.1 g/dL (ref 6.0–8.5)

## 2018-11-17 LAB — LIPID PANEL
Chol/HDL Ratio: 3.2 ratio (ref 0.0–5.0)
Cholesterol, Total: 177 mg/dL (ref 100–199)
HDL: 55 mg/dL (ref >39)
LDL Chol Calc (NIH): 113 mg/dL — ABNORMAL HIGH (ref 0–99)
Triglycerides: 42 mg/dL (ref 0–149)
VLDL Cholesterol Cal: 9 mg/dL (ref 5–40)

## 2018-11-17 LAB — FPSA% REFLEX
% FREE PSA: 13.6 %
PSA, FREE: 0.75 ng/mL

## 2018-11-17 LAB — PSA TOTAL (REFLEX TO FREE): Prostate Specific Ag, Serum: 5.5 ng/mL — ABNORMAL HIGH (ref 0.0–4.0)

## 2018-11-17 LAB — HEPATITIS C ANTIBODY: Hep C Virus Ab: <0.1 s/co ratio (ref 0.0–0.9)

## 2018-11-17 MED ORDER — TAMSULOSIN HCL 0.4 MG PO CAPS: 0.4000 mg | ORAL_CAPSULE | Freq: Every evening | ORAL | 3 refills | Status: DC

## 2018-11-17 NOTE — Telephone Encounter (Signed)
Called pt no answer. LM for pt informing him of the information below. Lab order placed. Pt advised to call and make lab appt prior to office visit.

## 2018-11-17 NOTE — Telephone Encounter (Signed)
-----   Message from Billey Co, MD sent at 11/17/2018 12:59 PM EST ----- PSA was slightly elevated this year at 5.5. Please have him repeat PSA with free/total ratio in December before our follow up visit.  [name], MD 11/17/2018

## 2018-11-30 ENCOUNTER — Other Ambulatory Visit: Payer: Self-pay

## 2018-11-30 ENCOUNTER — Other Ambulatory Visit: Payer: BC Managed Care – PPO

## 2018-11-30 DIAGNOSIS — Z125 Encounter for screening for malignant neoplasm of prostate: Secondary | ICD-10-CM

## 2018-12-01 LAB — PSA, TOTAL AND FREE
PSA, Free Pct: 13.1 %
PSA, Free: 0.92 ng/mL
Prostate Specific Ag, Serum: 7 ng/mL — ABNORMAL HIGH (ref 0.0–4.0)

## 2018-12-06 ENCOUNTER — Ambulatory Visit: Payer: BC Managed Care – PPO | Admitting: Urology

## 2018-12-15 ENCOUNTER — Other Ambulatory Visit: Payer: Self-pay

## 2018-12-15 ENCOUNTER — Encounter: Payer: Self-pay | Admitting: Urology

## 2018-12-15 ENCOUNTER — Ambulatory Visit (INDEPENDENT_AMBULATORY_CARE_PROVIDER_SITE_OTHER): Payer: BC Managed Care – PPO | Admitting: Urology

## 2018-12-15 ENCOUNTER — Telehealth: Payer: Self-pay | Admitting: *Deleted

## 2018-12-15 VITALS — BP 162/95 | HR 62 | Ht 75.0 in | Wt 190.0 lb

## 2018-12-15 DIAGNOSIS — N138 Other obstructive and reflux uropathy: Secondary | ICD-10-CM | POA: Diagnosis not present

## 2018-12-15 DIAGNOSIS — N401 Enlarged prostate with lower urinary tract symptoms: Secondary | ICD-10-CM | POA: Diagnosis not present

## 2018-12-15 DIAGNOSIS — R972 Elevated prostate specific antigen [PSA]: Secondary | ICD-10-CM

## 2018-12-15 DIAGNOSIS — Z125 Encounter for screening for malignant neoplasm of prostate: Secondary | ICD-10-CM | POA: Diagnosis not present

## 2018-12-15 LAB — BLADDER SCAN AMB NON-IMAGING

## 2018-12-15 NOTE — Progress Notes (Signed)
   12/15/2018 4:17 PM   Micheal Mccoy [birth year] [id number]  Reason for visit: Follow up elevated PSA, BPH and urinary symptoms  HPI: I saw Micheal Mccoy back in urology clinic for the above issues.  He is a very healthy and active 63 year old African-American male with an extensive urologic history.  He has a history of a negative prostate biopsy with Dr. Pilar Jarvis in 2017 for mildly elevated PSA of 4.2.  His PSA last year was stable at 3.1.  He denies any family history of prostate cancer.  PSA on 11/16/2019 was elevated at 5.5, and remained elevated at 7.0(13% free) on repeat PSA 11/30/2018.  He also has a history of a negative gross hematuria work-up in 2017, and prostate measured 125 g at that time.  He also reports acute worsening of his urinary symptoms over the last 2 months with straining to urinate, weak stream, urgency, frequency, and nocturia 4-5 times per night.  He was recently started on Flomax by his primary which he feels has helped significantly.  PVR in clinic today is 35 mL.  Urinalysis today is benign with 0-5 WBCs, 0 RBCs, no bacteria, nitrite negative.  We discussed options at length regarding both his elevated PSA and his BPH and urinary symptoms with significantly enlarged prostate.  With his persistently rising PSA and no evidence of infection on urinalysis today, I strongly recommended pursuing prostate MRI.  We discussed that if there is any abnormal lesions found on prostate MRI, would perform a fusion biopsy with [facility] in [place].  If prostate MRI shows no abnormal lesions, would consider proceeding directly to HoLEP for his significant obstructive bothersome urinary symptoms.  We discussed the different treatment strategies for prostate cancer versus BPH and obstruction, and I think he has a good understanding of the plan.  Prostate MRI ordered, call with results Fusion biopsy if any abnormal lesions, if prostate MRI normal pursue HoLEP   A total of 25  minutes were spent face-to-face with the patient, greater than 50% was spent in patient education, counseling, and coordination of care regarding elevated PSA, BPH, and urinary symptoms.  Micheal Mccoy, [facility] [address] [place], [place] [address] [phone]

## 2018-12-15 NOTE — Patient Instructions (Addendum)

## 2018-12-15 NOTE — Telephone Encounter (Addendum)
Patient informed-voiced understanding.  ----- Message from Billey Co, MD sent at 12/15/2018  4:26 PM EST ----- Regarding: prostate MRI Urinalysis is completely normal, no sign of blood or infection.  I ordered prostate MRI as we discussed in clinic, I will call him with those results  Thanks  Micheal Madrid, MD 12/15/2018

## 2018-12-16 LAB — URINALYSIS, COMPLETE
Bilirubin, UA: NEGATIVE
Glucose, UA: NEGATIVE
Ketones, UA: NEGATIVE
Leukocytes,UA: NEGATIVE
Nitrite, UA: NEGATIVE
Protein,UA: NEGATIVE
Specific Gravity, UA: >1.03 — ABNORMAL HIGH (ref 1.005–1.030)
Urobilinogen, Ur: 0.2 mg/dL (ref 0.2–1.0)
pH, UA: 5.5 (ref 5.0–7.5)

## 2018-12-16 LAB — MICROSCOPIC EXAMINATION
Bacteria, UA: NONE SEEN
RBC, Urine: NONE SEEN /hpf (ref 0–2)

## 2019-01-03 ENCOUNTER — Ambulatory Visit
Admission: RE | Admit: 2019-01-03 | Discharge: 2019-01-03 | Disposition: A | Discharge status: Home / Self Care | Payer: BC Managed Care – PPO | Source: Ambulatory Visit | Attending: Urology | Admitting: Urology

## 2019-01-03 ENCOUNTER — Other Ambulatory Visit: Payer: Self-pay

## 2019-01-03 DIAGNOSIS — Z125 Encounter for screening for malignant neoplasm of prostate: Secondary | ICD-10-CM | POA: Diagnosis present

## 2019-01-03 MED ORDER — GADOBUTROL 1 MMOL/ML IV SOLN
8.0000 mL | Freq: Once | INTRAVENOUS | Status: AC | PRN
  Administered 2019-01-03: 8 mL via INTRAVENOUS

## 2019-01-04 ENCOUNTER — Telehealth: Payer: Self-pay | Admitting: Urology

## 2019-01-04 ENCOUNTER — Encounter: Payer: Self-pay | Admitting: *Deleted

## 2019-01-04 NOTE — Telephone Encounter (Signed)
UROLOGY TELEPHONE NOTE  I had phone follow-up with Micheal Mccoy today to discuss his prostate results.  Briefly, he is a healthy 63 year old male with a history of a negative prostate biopsy with Dr. Pilar Jarvis in 2017 for mildly elevated PSA of 4.2.  PSA in November 2020 was elevated at 5.5, and remained elevated at 7.0(13% free) on repeat PSA 2 weeks later.  Urinalysis was benign.  He had a negative gross hematuria work-up in 2017, and prostate measured 125 g at that time.  Prostate MRI was performed on 01/03/2019 and showed no suspicious findings for microscopic prostate cancer.  Prostate volume was 87 mL.  We discussed these findings at length.  He has bothersome urinary symptoms of weak stream, straining, feeling of incomplete emptying, and nocturia at baseline.  He takes Flomax which he feels helps, but he does not like being on medication.  We discussed options for his BPH at length including continuing Flomax, addition of finasteride, or an outlet procedure with HOLEP.  We discussed the risks and benefits of HOLEP at length.  The procedure requires general anesthesia and takes 2 to 3 hours, and a holmium laser is used to enucleate the prostate and push this tissue into the bladder.  A morcellator is then used to remove this tissue, which is sent for pathology.  Majority of patients are able to discharge the same day with a catheter in place for 2 to 3 days, and will follow-up in clinic for a voiding trial.  Approximately 10% of patients will be admitted overnight to monitor the urine, or if they have multiple comorbidities.  We specifically discussed the risks of bleeding, infection, retrograde ejaculation, temporary urgency and urge incontinence, very low risk of long-term incontinence, and possible need for additional procedures.  He would like to proceed with HoLEP.   [name], MD 01/04/2019

## 2019-01-06 ENCOUNTER — Other Ambulatory Visit: Payer: Self-pay | Admitting: Urology

## 2019-01-06 ENCOUNTER — Other Ambulatory Visit: Payer: BC Managed Care – PPO

## 2019-01-06 ENCOUNTER — Other Ambulatory Visit: Payer: Self-pay

## 2019-01-06 DIAGNOSIS — N138 Other obstructive and reflux uropathy: Secondary | ICD-10-CM

## 2019-01-06 DIAGNOSIS — N401 Enlarged prostate with lower urinary tract symptoms: Secondary | ICD-10-CM

## 2019-01-06 LAB — MICROSCOPIC EXAMINATION
Bacteria, UA: NONE SEEN
Epithelial Cells (non renal): NONE SEEN /hpf (ref 0–10)
WBC, UA: NONE SEEN /hpf (ref 0–5)

## 2019-01-06 LAB — URINALYSIS, COMPLETE
Bilirubin, UA: NEGATIVE
Glucose, UA: NEGATIVE
Ketones, UA: NEGATIVE
Leukocytes,UA: NEGATIVE
Nitrite, UA: NEGATIVE
Protein,UA: NEGATIVE
Specific Gravity, UA: >1.03 — ABNORMAL HIGH (ref 1.005–1.030)
Urobilinogen, Ur: 0.2 mg/dL (ref 0.2–1.0)
pH, UA: 5.5 (ref 5.0–7.5)

## 2019-01-09 LAB — CULTURE, URINE COMPREHENSIVE

## 2019-01-10 ENCOUNTER — Other Ambulatory Visit: Payer: Self-pay | Admitting: Radiology

## 2019-01-11 ENCOUNTER — Encounter
Admission: RE | Admit: 2019-01-11 | Discharge: 2019-01-11 | Disposition: A | Discharge status: Home / Self Care | Payer: BC Managed Care – PPO | Source: Ambulatory Visit | Attending: Urology | Admitting: Urology

## 2019-01-11 ENCOUNTER — Other Ambulatory Visit: Payer: Self-pay

## 2019-01-11 NOTE — Patient Instructions (Signed)
Your procedure is scheduled on: Friday January 14, 2019 Report to Day Surgery on the 2nd floor of the [facility]. To find out your arrival time, please call [phone] between 1PM - 3PM on: Thursday January 13, 2019  REMEMBER: Instructions that are not followed completely may result in serious medical risk, up to and including death; or upon the discretion of your surgeon and anesthesiologist your surgery may need to be rescheduled.  Do not eat food after midnight the night before surgery.  No gum chewing, lozengers or hard candies.  You may however, drink CLEAR liquids up to 2 hours before you are scheduled to arrive for your surgery. Do not drink anything within 2 hours of the start of your surgery.  Clear liquids include: - water  - apple juice without pulp -  CLEAR gatorade - black coffee or tea (Do NOT add milk or creamers to the coffee or tea) Do NOT drink anything that is not on this list.  Type 1 and Type 2 diabetics should only drink water.  No Alcohol for 24 hours before or after surgery.  No Smoking including e-cigarettes for 24 hours prior to surgery.  No chewable tobacco products for at least 6 hours prior to surgery.  No nicotine patches on the day of surgery.  On the morning of surgery brush your teeth with toothpaste and water, you may rinse your mouth with mouthwash if you wish. Do not swallow any toothpaste or mouthwash.  Notify your doctor if there is any change in your medical condition (cold, fever, infection).  Do not wear jewelry, make-up, hairpins, clips or nail polish.  Do not wear lotions, powders, or perfumes.   Do not shave 48 hours prior to surgery.   Contacts and dentures may not be worn into surgery.  Do not bring valuables to the hospital, including drivers license, insurance or credit cards.  [facility] is not responsible for any belongings or valuables.   TAKE THESE MEDICATIONS THE MORNING OF SURGERY: NONE  TAKE A SHOWER THE MORNING  OF SURGERY.  Stop Anti-inflammatories (NSAIDS) such as Advil, Aleve, Ibuprofen, Motrin, Naproxen, Naprosyn and ASPIRIN OR Aspirin based products such as Excedrin, Goodys Powder, BC Powder. (May take Tylenol or Acetaminophen if needed.)  Stop ANY OVER THE COUNTER supplements until after surgery. (May continue Vitamin D, Vitamin B, and multivitamin.)  Wear comfortable clothing (specific to your surgery type) to the hospital.  Plan for stool softeners for home use.  If you are being discharged the day of surgery, you will not be allowed to drive home. You will need a responsible adult to drive you home and stay with you that night.   Please call [phone] if you have any questions about these instructions.

## 2019-01-12 ENCOUNTER — Other Ambulatory Visit: Payer: BC Managed Care – PPO

## 2019-01-12 ENCOUNTER — Encounter
Admission: RE | Admit: 2019-01-12 | Discharge: 2019-01-12 | Disposition: A | Discharge status: Home / Self Care | Payer: BC Managed Care – PPO | Source: Ambulatory Visit | Attending: Urology | Admitting: Urology

## 2019-01-12 DIAGNOSIS — Z01818 Encounter for other preprocedural examination: Secondary | ICD-10-CM | POA: Diagnosis present

## 2019-01-12 DIAGNOSIS — Z20822 Contact with and (suspected) exposure to covid-19: Secondary | ICD-10-CM | POA: Insufficient documentation

## 2019-01-12 LAB — SARS CORONAVIRUS 2 (TAT 6-24 HRS): SARS Coronavirus 2: NEGATIVE

## 2019-01-31 ENCOUNTER — Other Ambulatory Visit: Payer: Self-pay | Admitting: Urology

## 2019-02-07 ENCOUNTER — Other Ambulatory Visit: Payer: Self-pay

## 2019-02-07 DIAGNOSIS — N138 Other obstructive and reflux uropathy: Secondary | ICD-10-CM

## 2019-02-08 ENCOUNTER — Other Ambulatory Visit: Payer: Self-pay

## 2019-02-08 ENCOUNTER — Other Ambulatory Visit: Payer: BC Managed Care – PPO

## 2019-02-08 DIAGNOSIS — N401 Enlarged prostate with lower urinary tract symptoms: Secondary | ICD-10-CM

## 2019-02-08 DIAGNOSIS — N138 Other obstructive and reflux uropathy: Secondary | ICD-10-CM

## 2019-02-09 ENCOUNTER — Other Ambulatory Visit: Payer: Self-pay | Admitting: Family Medicine

## 2019-02-09 NOTE — Telephone Encounter (Signed)
Requested medication (s) are due for refill today: yes  Requested medication (s) are on the active medication list: yes  Last refill: 11/17/2018  Future visit scheduled: no  Notes to clinic:  no valid encounter in last 12 months    Requested Prescriptions  Pending Prescriptions Disp Refills   tamsulosin (FLOMAX) 0.4 MG CAPS capsule [Pharmacy Med Name: TAMSULOSIN HCL 0.4 MG CAPSULE] 90 capsule 1    Sig: TAKE 1 [place]      Urology: Alpha-Adrenergic Blocker Failed - 02/09/2019 12:34 AM      Failed - Last BP in normal range    BP Readings from Last 1 Encounters:  12/15/18 (!) 162/95          Failed - Valid encounter within last 12 months    Recent Outpatient Visits           2 months ago Annual physical exam   [facility] [name], MD   1 year ago Cervical spondylosis without myelopathy   [facility] [name], MD   3 years ago Annual physical exam   [facility] [name], MD   4 years ago Urinary frequency   [facility] [name], MD   4 years ago Annual physical exam   [facility] [name], MD       Future Appointments             In 4 months Diamantina [facility], Herbert Seta, [facility]

## 2019-02-11 ENCOUNTER — Telehealth: Payer: Self-pay

## 2019-02-11 DIAGNOSIS — B9620 Unspecified Escherichia coli [E. coli] as the cause of diseases classified elsewhere: Secondary | ICD-10-CM

## 2019-02-11 LAB — CULTURE, URINE COMPREHENSIVE

## 2019-02-11 MED ORDER — NITROFURANTOIN MONOHYD MACRO 100 MG PO CAPS: 100.0000 mg | ORAL_CAPSULE | Freq: Two times a day (BID) | ORAL | 0 refills | Status: AC

## 2019-02-11 NOTE — Telephone Encounter (Signed)
Called pt informed him of the information below. RX sent into pharmacy. Pt gave verbal understanding.

## 2019-02-11 NOTE — Telephone Encounter (Signed)
-----   Message from Billey Co, MD sent at 02/11/2019  2:31 PM EST ----- Please start nitrofurantoin 100 mg twice daily x7 days for bacteria in urine with HOLEP next week  Thanks [name], MD 02/11/2019

## 2019-02-14 ENCOUNTER — Other Ambulatory Visit: Payer: Self-pay

## 2019-02-14 ENCOUNTER — Other Ambulatory Visit
Admission: RE | Admit: 2019-02-14 | Discharge: 2019-02-14 | Disposition: A | Discharge status: Home / Self Care | Payer: BC Managed Care – PPO | Source: Ambulatory Visit | Attending: Urology | Admitting: Urology

## 2019-02-14 DIAGNOSIS — Z01812 Encounter for preprocedural laboratory examination: Secondary | ICD-10-CM | POA: Insufficient documentation

## 2019-02-14 DIAGNOSIS — Z20822 Contact with and (suspected) exposure to covid-19: Secondary | ICD-10-CM | POA: Diagnosis not present

## 2019-02-14 LAB — SARS CORONAVIRUS 2 (TAT 6-24 HRS): SARS Coronavirus 2: NEGATIVE

## 2019-02-15 ENCOUNTER — Other Ambulatory Visit: Admission: RE | Admit: 2019-02-15 | Payer: BC Managed Care – PPO | Source: Ambulatory Visit

## 2019-02-17 ENCOUNTER — Telehealth: Payer: Self-pay | Admitting: Urology

## 2019-02-17 ENCOUNTER — Ambulatory Visit: Payer: BC Managed Care – PPO

## 2019-02-17 ENCOUNTER — Encounter: Payer: Self-pay | Admitting: Urology

## 2019-02-17 ENCOUNTER — Other Ambulatory Visit: Payer: Self-pay

## 2019-02-17 ENCOUNTER — Ambulatory Visit
Admission: RE | Admit: 2019-02-17 | Discharge: 2019-02-17 | Disposition: A | Discharge status: Home / Self Care | Payer: BC Managed Care – PPO | Source: Ambulatory Visit | Attending: Urology | Admitting: Urology

## 2019-02-17 ENCOUNTER — Encounter
Admission: RE | Disposition: A | Discharge status: Home / Self Care | Payer: Self-pay | Source: Ambulatory Visit | Attending: Urology

## 2019-02-17 DIAGNOSIS — R3912 Poor urinary stream: Secondary | ICD-10-CM | POA: Diagnosis not present

## 2019-02-17 DIAGNOSIS — Z981 Arthrodesis status: Secondary | ICD-10-CM | POA: Diagnosis not present

## 2019-02-17 DIAGNOSIS — N401 Enlarged prostate with lower urinary tract symptoms: Secondary | ICD-10-CM | POA: Insufficient documentation

## 2019-02-17 DIAGNOSIS — R3914 Feeling of incomplete bladder emptying: Secondary | ICD-10-CM | POA: Diagnosis not present

## 2019-02-17 DIAGNOSIS — N3289 Other specified disorders of bladder: Secondary | ICD-10-CM | POA: Diagnosis not present

## 2019-02-17 DIAGNOSIS — N138 Other obstructive and reflux uropathy: Secondary | ICD-10-CM

## 2019-02-17 HISTORY — PX: HOLEP-LASER ENUCLEATION OF THE PROSTATE WITH MORCELLATION: SHX6641

## 2019-02-17 SURGERY — ENUCLEATION, PROSTATE, USING LASER, WITH MORCELLATION
Anesthesia: General

## 2019-02-17 MED ORDER — HYDROCODONE-ACETAMINOPHEN 7.5-325 MG PO TABS
ORAL_TABLET | ORAL | Status: AC
  Filled 2019-02-17: qty 1

## 2019-02-17 MED ORDER — DEXAMETHASONE SODIUM PHOSPHATE 10 MG/ML IJ SOLN
INTRAMUSCULAR | Status: DC | PRN
  Administered 2019-02-17: 5 mg via INTRAVENOUS

## 2019-02-17 MED ORDER — VASOPRESSIN 20 UNIT/ML IV SOLN
INTRAVENOUS | Status: AC
  Filled 2019-02-17: qty 1

## 2019-02-17 MED ORDER — FAMOTIDINE 20 MG PO TABS
20.0000 mg | ORAL_TABLET | Freq: Once | ORAL | Status: AC
  Administered 2019-02-17: 20 mg via ORAL

## 2019-02-17 MED ORDER — MIDAZOLAM HCL 2 MG/2ML IJ SOLN
INTRAMUSCULAR | Status: DC | PRN
  Administered 2019-02-17: 2 mg via INTRAVENOUS

## 2019-02-17 MED ORDER — SODIUM CHLORIDE (PF) 0.9 % IJ SOLN
INTRAMUSCULAR | Status: AC
  Filled 2019-02-17: qty 20

## 2019-02-17 MED ORDER — FENTANYL CITRATE (PF) 100 MCG/2ML IJ SOLN
INTRAMUSCULAR | Status: AC
  Administered 2019-02-17: 11:00:00 25 ug via INTRAVENOUS
  Filled 2019-02-17: qty 2

## 2019-02-17 MED ORDER — PHENYLEPHRINE HCL (PRESSORS) 10 MG/ML IV SOLN
INTRAVENOUS | Status: DC | PRN
  Administered 2019-02-17 (×2): 100 ug via INTRAVENOUS
  Administered 2019-02-17: 50 ug via INTRAVENOUS
  Administered 2019-02-17: 200 ug via INTRAVENOUS
  Administered 2019-02-17 (×2): 100 ug via INTRAVENOUS
  Administered 2019-02-17: 200 ug via INTRAVENOUS
  Administered 2019-02-17 (×4): 100 ug via INTRAVENOUS
  Administered 2019-02-17: 50 ug via INTRAVENOUS

## 2019-02-17 MED ORDER — KETOROLAC TROMETHAMINE 15 MG/ML IJ SOLN
15.0000 mg | Freq: Once | INTRAMUSCULAR | Status: AC
  Administered 2019-02-17: 15 mg via INTRAVENOUS

## 2019-02-17 MED ORDER — CEFAZOLIN SODIUM-DEXTROSE 2-4 GM/100ML-% IV SOLN
2.0000 g | INTRAVENOUS | Status: AC
  Administered 2019-02-17: 2 g via INTRAVENOUS

## 2019-02-17 MED ORDER — FAMOTIDINE 20 MG PO TABS
ORAL_TABLET | ORAL | Status: AC
  Filled 2019-02-17: qty 1

## 2019-02-17 MED ORDER — ONDANSETRON HCL 4 MG/2ML IJ SOLN
INTRAMUSCULAR | Status: DC | PRN
  Administered 2019-02-17: 4 mg via INTRAVENOUS

## 2019-02-17 MED ORDER — VASOPRESSIN 20 UNIT/ML IV SOLN
INTRAVENOUS | Status: DC | PRN
  Administered 2019-02-17 (×2): .25 [IU] via INTRAVENOUS

## 2019-02-17 MED ORDER — FENTANYL CITRATE (PF) 100 MCG/2ML IJ SOLN
INTRAMUSCULAR | Status: AC
  Filled 2019-02-17: qty 2

## 2019-02-17 MED ORDER — SUCCINYLCHOLINE CHLORIDE 20 MG/ML IJ SOLN
INTRAMUSCULAR | Status: AC
  Filled 2019-02-17: qty 1

## 2019-02-17 MED ORDER — LACTATED RINGERS IV SOLN: INTRAVENOUS | Status: DC

## 2019-02-17 MED ORDER — KETOROLAC TROMETHAMINE 30 MG/ML IJ SOLN: 30.0000 mg | Freq: Once | INTRAMUSCULAR | Status: DC | PRN

## 2019-02-17 MED ORDER — HYDROCODONE-ACETAMINOPHEN 5-325 MG PO TABS: 1.0000 | ORAL_TABLET | ORAL | 0 refills | Status: AC | PRN

## 2019-02-17 MED ORDER — SUGAMMADEX SODIUM 200 MG/2ML IV SOLN
INTRAVENOUS | Status: DC | PRN
  Administered 2019-02-17: 200 mg via INTRAVENOUS

## 2019-02-17 MED ORDER — BELLADONNA ALKALOIDS-OPIUM 16.2-60 MG RE SUPP
RECTAL | Status: AC
  Filled 2019-02-17: qty 1

## 2019-02-17 MED ORDER — SODIUM CHLORIDE 0.9 % IV SOLN
INTRAVENOUS | Status: DC | PRN
  Administered 2019-02-17: 60 ug/min via INTRAVENOUS

## 2019-02-17 MED ORDER — CEFAZOLIN SODIUM-DEXTROSE 2-4 GM/100ML-% IV SOLN
INTRAVENOUS | Status: AC
  Filled 2019-02-17: qty 100

## 2019-02-17 MED ORDER — PROPOFOL 10 MG/ML IV BOLUS
INTRAVENOUS | Status: AC
  Filled 2019-02-17: qty 40

## 2019-02-17 MED ORDER — PROMETHAZINE HCL 25 MG/ML IJ SOLN: 6.2500 mg | INTRAMUSCULAR | Status: DC | PRN

## 2019-02-17 MED ORDER — MEPERIDINE HCL 50 MG/ML IJ SOLN: 6.2500 mg | INTRAMUSCULAR | Status: DC | PRN

## 2019-02-17 MED ORDER — MIDAZOLAM HCL 2 MG/2ML IJ SOLN
INTRAMUSCULAR | Status: AC
  Filled 2019-02-17: qty 2

## 2019-02-17 MED ORDER — LIDOCAINE HCL (CARDIAC) PF 100 MG/5ML IV SOSY
PREFILLED_SYRINGE | INTRAVENOUS | Status: DC | PRN
  Administered 2019-02-17: 60 mg via INTRAVENOUS

## 2019-02-17 MED ORDER — ROCURONIUM BROMIDE 100 MG/10ML IV SOLN
INTRAVENOUS | Status: DC | PRN
  Administered 2019-02-17: 10 mg via INTRAVENOUS
  Administered 2019-02-17: 20 mg via INTRAVENOUS
  Administered 2019-02-17 (×2): 10 mg via INTRAVENOUS

## 2019-02-17 MED ORDER — CEFAZOLIN SODIUM 1 G IJ SOLR
INTRAMUSCULAR | Status: AC
  Filled 2019-02-17: qty 10

## 2019-02-17 MED ORDER — ACETAMINOPHEN 325 MG PO TABS: 325.0000 mg | ORAL_TABLET | ORAL | Status: DC | PRN

## 2019-02-17 MED ORDER — ROCURONIUM BROMIDE 50 MG/5ML IV SOLN
INTRAVENOUS | Status: AC
  Filled 2019-02-17: qty 1

## 2019-02-17 MED ORDER — SEVOFLURANE IN SOLN
RESPIRATORY_TRACT | Status: AC
  Filled 2019-02-17: qty 250

## 2019-02-17 MED ORDER — ACETAMINOPHEN 160 MG/5ML PO SOLN
325.0000 mg | ORAL | Status: DC | PRN
  Filled 2019-02-17: qty 20.3

## 2019-02-17 MED ORDER — HYDROCODONE-ACETAMINOPHEN 7.5-325 MG PO TABS
1.0000 | ORAL_TABLET | Freq: Once | ORAL | Status: AC | PRN
  Administered 2019-02-17: 1 via ORAL
  Filled 2019-02-17: qty 1

## 2019-02-17 MED ORDER — ONDANSETRON HCL 4 MG/2ML IJ SOLN
INTRAMUSCULAR | Status: AC
  Filled 2019-02-17: qty 2

## 2019-02-17 MED ORDER — KETOROLAC TROMETHAMINE 30 MG/ML IJ SOLN
INTRAMUSCULAR | Status: AC
  Filled 2019-02-17: qty 1

## 2019-02-17 MED ORDER — SUCCINYLCHOLINE CHLORIDE 20 MG/ML IJ SOLN
INTRAMUSCULAR | Status: DC | PRN
  Administered 2019-02-17: 140 mg via INTRAVENOUS

## 2019-02-17 MED ORDER — PROPOFOL 10 MG/ML IV BOLUS
INTRAVENOUS | Status: DC | PRN
  Administered 2019-02-17: 150 mg via INTRAVENOUS

## 2019-02-17 MED ORDER — LIDOCAINE HCL (PF) 2 % IJ SOLN
INTRAMUSCULAR | Status: AC
  Filled 2019-02-17: qty 5

## 2019-02-17 MED ORDER — DEXAMETHASONE SODIUM PHOSPHATE 10 MG/ML IJ SOLN
INTRAMUSCULAR | Status: AC
  Filled 2019-02-17: qty 1

## 2019-02-17 MED ORDER — FENTANYL CITRATE (PF) 100 MCG/2ML IJ SOLN
25.0000 ug | INTRAMUSCULAR | Status: DC | PRN
  Administered 2019-02-17 (×3): 25 ug via INTRAVENOUS

## 2019-02-17 MED ORDER — FENTANYL CITRATE (PF) 100 MCG/2ML IJ SOLN
INTRAMUSCULAR | Status: DC | PRN
  Administered 2019-02-17 (×2): 50 ug via INTRAVENOUS

## 2019-02-17 SURGICAL SUPPLY — 33 items
ADAPTER IRRIG TUBE 2 SPIKE SOL (ADAPTER) ×4 IMPLANT
BAG URO DRAIN 4000ML (MISCELLANEOUS) ×2 IMPLANT
CATH FOLEY 3WAY 30CC 24FR (CATHETERS) ×1
CATH URETL 5X70 OPEN END (CATHETERS) ×2 IMPLANT
CATH URTH STD 24FR FL 3W 2 (CATHETERS) ×1 IMPLANT
CONTAINER COLLECT MORCELLATR (MISCELLANEOUS) ×1 IMPLANT
DRAPE UTILITY 15X26 TOWEL STRL (DRAPES) IMPLANT
ELECT BIVAP BIPO 22/24 DONUT (ELECTROSURGICAL)
ELECTRD BIVAP BIPO 22/24 DONUT (ELECTROSURGICAL) IMPLANT
FILTER OVERFLOW MORCELLATOR (FILTER) ×1 IMPLANT
GLOVE BIOGEL PI IND STRL 7.5 (GLOVE) ×1 IMPLANT
GLOVE BIOGEL PI INDICATOR 7.5 (GLOVE) ×1
GOWN STRL REUS W/ TWL LRG LVL3 (GOWN DISPOSABLE) ×1 IMPLANT
GOWN STRL REUS W/ TWL XL LVL3 (GOWN DISPOSABLE) ×1 IMPLANT
GOWN STRL REUS W/TWL LRG LVL3 (GOWN DISPOSABLE) ×1
GOWN STRL REUS W/TWL XL LVL3 (GOWN DISPOSABLE) ×1
HOLDER FOLEY CATH W/STRAP (MISCELLANEOUS) ×2 IMPLANT
KIT TURNOVER CYSTO (KITS) ×2 IMPLANT
LASER FIBER 550M SMARTSCOPE (Laser) ×2 IMPLANT
LASER FIBER FLEXIVA 550 (UROLOGICAL SUPPLIES) ×1 IMPLANT
MORCELLATOR COLLECT CONTAINER (MISCELLANEOUS) ×2
MORCELLATOR OVERFLOW FILTER (FILTER) ×2
MORCELLATOR ROTATION 4.75 335 (MISCELLANEOUS) ×3 IMPLANT
PACK CYSTO AR (MISCELLANEOUS) ×2 IMPLANT
SET CYSTO W/LG BORE CLAMP LF (SET/KITS/TRAYS/PACK) ×2 IMPLANT
SET IRRIG Y TYPE TUR BLADDER L (SET/KITS/TRAYS/PACK) ×2 IMPLANT
SLEEVE PROTECTION STRL DISP (MISCELLANEOUS) ×4 IMPLANT
SOL .9 NS 3000ML IRR  AL (IV SOLUTION) ×9
SOL .9 NS 3000ML IRR UROMATIC (IV SOLUTION) ×4 IMPLANT
SURGILUBE 2OZ TUBE FLIPTOP (MISCELLANEOUS) ×2 IMPLANT
SYRINGE IRR TOOMEY STRL 70CC (SYRINGE) ×2 IMPLANT
TUBE PUMP MORCELLATOR PIRANHA (TUBING) ×2 IMPLANT
WATER STERILE IRR 1000ML POUR (IV SOLUTION) ×2 IMPLANT

## 2019-02-17 NOTE — Anesthesia Postprocedure Evaluation (Signed)
Anesthesia Post Note  Patient: [name]  Procedure(s) Performed: HOLEP-LASER ENUCLEATION OF THE PROSTATE WITH MORCELLATION (N/A )  Patient location during evaluation: PACU Anesthesia Type: General Level of consciousness: awake and alert Pain management: pain level controlled Vital Signs Assessment: post-procedure vital signs reviewed and stable Respiratory status: spontaneous breathing, nonlabored ventilation and respiratory function stable Cardiovascular status: blood pressure returned to baseline and stable Postop Assessment: no apparent nausea or vomiting Anesthetic complications: no Comments: Small nick on upper lip likely 2 to ETT placement. Will manage w ice pack/ointment. Pt instructed let team know if not improving      Last Vitals:  Vitals:   02/17/19 1202 02/17/19 1231  BP: (!) 144/87 121/90  Pulse: 73   Resp: 11 16  Temp:  (!) 36.1 C  SpO2: 97% 100%    Last Pain:  Vitals:   02/17/19 1231  TempSrc: Temporal  PainSc: 8                  Chabeli Barsamian Harvie Heck

## 2019-02-17 NOTE — Transfer of Care (Signed)
Immediate Anesthesia Transfer of Care Note  Patient: Micheal Mccoy  Procedure(s) Performed: Kristopher Glee OF THE PROSTATE WITH MORCELLATION (N/A )  Patient Location: PACU  Anesthesia Type:General  Level of Consciousness: awake and drowsy  Airway & Oxygen Therapy: Patient Spontanous Breathing and Patient connected to face mask oxygen  Post-op Assessment: Report given to RN and Post -op Vital signs reviewed and stable  Post vital signs: Reviewed and stable  Last Vitals:  Vitals Value Taken Time  BP 126/91 02/17/19 0953  Temp    Pulse 72 02/17/19 0955  Resp 20 02/17/19 0955  SpO2 100 % 02/17/19 0955  Vitals shown include unvalidated device data.  Last Pain:  Vitals:   02/17/19 0610  TempSrc: Tympanic  PainSc: 0-No pain         Complications: No apparent anesthesia complications

## 2019-02-17 NOTE — Op Note (Signed)
Date of procedure: 02/17/19  Preoperative diagnosis:  1. 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 Same  Procedure: 1. HoLEP (Holmium Laser Enucleation of the Prostate)  Surgeon: [name], MD  Anesthesia: General  Complications: None  Intraoperative findings:  1.  Large prostate with obstructing lateral lobes.  Mild bladder trabeculations, no tumors or lesions, ureteral orifices orthotopic bilaterally 2.  Excellent hemostasis, ureteral orifices and verumontanum intact at conclusion of case  EBL: 25 mL  Specimens: Prostate chips  Enucleation time: 54 minutes  Morcellation time: 43 minutes  Intra-op weight: 104g  Drains: 24 French three-way, 60 cc in balloon  Indication: CHAVIS ARVELO is a 64 y.o. patient with history of elevated PSA and BPH symptoms of weak stream and incomplete bladder emptying.  Prostate MRI showed no suspicious lesions for prostate cancer and he elected for HOLEP.  After reviewing the management options for treatment, they elected to proceed with the above surgical procedure(s). We have discussed the potential benefits and risks of the procedure, side effects of the proposed treatment, the likelihood of the patient achieving the goals of the procedure, and any potential problems that might occur during the procedure or recuperation.  We specifically discussed the risks of bleeding, infection, hematuria and clot retention, need for additional procedures, possible overnight hospital stay, temporary urgency and incontinence, rare long-term incontinence, and retrograde ejaculation.  Informed consent has been obtained.   Description of procedure:  The patient was taken to the operating room and general anesthesia was induced.  The patient was placed in the dorsal lithotomy position, prepped and draped in the usual sterile fashion, and preoperative antibiotics were administered.  SCDs were placed for DVT prophylaxis.  A  preoperative time-out was performed.   The 6 French continuous flow resectoscope was inserted into the urethra using the visual obturator  The prostate was large with obstructing lateral lobes. The bladder was thoroughly inspected and notable for mild bladder trabeculations but no suspicious lesions.  The ureteral orifices were located in orthotopic position.  The laser was set to 2 J and 50 Hz and was used to make an incision at the 6 o'clock position to the level of the capsule from the bladder neck to the verumontanum.  The lateral lobes were then incised circumferentially until they were disconnected from the surrounding tissue.  The capsule was examined and laser was used for meticulous hemostasis.    The 71 French resectoscope was then switched out for the 71 French nephroscope and the lobes were morcellated and the tissue sent to pathology.  Notably, there was significant difficulty with the suction feature of the morcellator and this took significantly longer than standard.  A 24 French three-way catheter was inserted easily, and CBI was initiated.  60 cc were placed in the balloon.  Urine was pink.  The catheter irrigated easily with normal saline and cleared rapidly.  The patient tolerated the procedure well without any immediate complications and was extubated and transferred to the recovery room in stable condition.  Urine was clear on fast CBI.  A belladonna suppository was placed.  Disposition: Stable to PACU  Plan: Wean CBI in PACU, anticipate discharge home today with void trial in clinic in 2-3 days   [name], MD 02/17/2019

## 2019-02-17 NOTE — Anesthesia Procedure Notes (Signed)
Procedure Name: Intubation Date/Time: 02/17/2019 10:33 AM Performed by: Allean Found, CRNA Pre-anesthesia Checklist: Patient identified, Patient being monitored, Timeout performed, Emergency Drugs available and Suction available Patient Re-evaluated:Patient Re-evaluated prior to induction Oxygen Delivery Method: Circle system utilized Preoxygenation: Pre-oxygenation with 100% oxygen Induction Type: IV induction Ventilation: Mask ventilation without difficulty Laryngoscope Size: Mac, 4 and McGraph (McGrath Mac 4 ) Grade View: Grade I Tube type: Oral Tube size: 7.5 mm Number of attempts: 1 Airway Equipment and Method: Stylet Placement Confirmation: ETT inserted through vocal cords under direct vision,  positive ETCO2 and breath sounds checked- equal and bilateral Secured at: 23 cm Tube secured with: Tape Dental Injury: Teeth and Oropharynx as per pre-operative assessment and Injury to lip  Comments: Lips dry, Small amount of blood on lip with DL, small crack noticed on lip. Lips lubricated

## 2019-02-17 NOTE — Telephone Encounter (Signed)
-----   Message from Billey Co, MD sent at 02/17/2019 10:00 AM EST ----- Regarding: follow up He needs HOLEP follow up with PA or nurse visit for foley removal on Monday 2/15, ok to overbook, then 6 weeks follow up with me w/PVR  [name], MD 02/17/2019

## 2019-02-17 NOTE — Telephone Encounter (Signed)
done

## 2019-02-17 NOTE — Anesthesia Preprocedure Evaluation (Addendum)
Anesthesia Evaluation  Patient identified by MRN, date of birth, ID band Patient awake    Reviewed: Allergy & Precautions, H&P , NPO status , reviewed documented beta blocker date and time   Airway Mallampati: II  TM Distance: >3 FB Neck ROM: full    Dental no notable dental hx.    Pulmonary    Pulmonary exam normal        Cardiovascular Normal cardiovascular exam     Neuro/Psych    GI/Hepatic GERD  Controlled,  Endo/Other    Renal/GU      Musculoskeletal  (+) Arthritis ,   Abdominal   Peds  Hematology   Anesthesia Other Findings Past Medical History: No date: Arthritis No date: Benign neoplasm of ascending colon No date: Degenerative disc disease, cervical 09/29/2005: Esophagitis, reflux No date: Frequency of urination No date: GERD (gastroesophageal reflux disease) No date: Peyronie's disease     Comment:  W/ PENILE CURVATURE Past Surgical History: 01/25/2018: ANTERIOR CERVICAL DECOMP/DISCECTOMY FUSION; N/A     Comment:  Procedure: ANTERIOR CERVICAL DECOMPRESSION/DISCECTOMY               FUSION 1 LEVEL C7-T1, T1-T2;  Surgeon: Deetta Perla, MD;               Location: [facility];  Service: Neurosurgery;  Laterality:               N/A; 01/25/2018: ANTERIOR CERVICAL DISCECTOMY     Comment:  Dr. Lacinda Axon, ACDF ARTHRODESIS, OSTEOPHYTECTOMY               &DECOMPRESSION OF SPINAL CORD AND/ORNERVE ROOTS; BELOW               C2--C7-T1 09/14/2014: COLONOSCOPY WITH PROPOFOL; N/A     Comment:  Procedure: COLONOSCOPY WITH PROPOFOL;  Surgeon: Lucilla Lame, MD;  Location: [place];  Service:               Endoscopy;  Laterality: N/A; 04/09/2015: HERNIA REPAIR; Bilateral     Comment:  Bard 3D Max mesh for bilateral inguinal hernias, primary              repair of umbilical henria.  04/09/2015: INGUINAL HERNIA REPAIR; Bilateral     Comment:  Procedure: LAPAROSCOPIC INGUINAL HERNIA;  Surgeon:     Robert Bellow, MD;  Location: [facility];  Service:               General;  Laterality: Bilateral; 06/28/2012: NESBIT PROCEDURE; N/A     Comment:  Procedure: 16 DOT PLACTATION PROCEDURE;  Surgeon: Claybon Jabs, MD;  Location: [place];                Service: Urology;  Laterality: N/A; 06/2012: Peyronies Disease 09/14/2014: POLYPECTOMY     Comment:  Procedure: POLYPECTOMY;  Surgeon: Lucilla Lame, MD;                Location: [place];  Service: Endoscopy;; 03-02-15: PROSTATE BIOPSY     Comment:  neg 01/25/2018: THORACIC DISCECTOMY     Comment:  Dr. Lacinda Axon, ARTHRODESIS ANTERIOR INTERBODY W/DISCECTOMY               ADDITIONAL LEVEL [id number]: UMBILICAL HERNIA REPAIR; N/A     Comment:  Procedure: HERNIA REPAIR UMBILICAL ADULT;  Surgeon:  Robert Bellow, MD;  Location: [facility];  Service:               General;  Laterality: N/A;   Reproductive/Obstetrics                            Anesthesia Physical Anesthesia Plan  ASA: II  Anesthesia Plan: General ETT   Post-op Pain Management:    Induction: Intravenous  PONV Risk Score and Plan: 3 and Ondansetron, Treatment may vary due to age or medical condition, Midazolam and Dexamethasone  Airway Management Planned: Oral ETT  Additional Equipment:   Intra-op Plan:   Post-operative Plan: Extubation in OR  Informed Consent: I have reviewed the patients History and Physical, chart, labs and discussed the procedure including the risks, benefits and alternatives for the proposed anesthesia with the patient or authorized representative who has indicated his/her understanding and acceptance.     Dental Advisory Given  Plan Discussed with: CRNA  Anesthesia Plan Comments: (LMA also possible if surgeon accepts)       Anesthesia Quick Evaluation

## 2019-02-17 NOTE — H&P (Signed)
02/17/19 6:56 AM   Micheal Mccoy 07/02/1955 [id number]   HPI: Micheal Mccoy is a healthy 64 year old male here today for HOLEP.  He has a history of elevated PSA, most recently 7.0 with 13% free, and prostate MRI was performed on 01/03/2019 and showed an 87 g prostate with no lesions suspicious for macroscopic prostate cancer.  He has a history of worsening BPH symptoms of weak stream, straining, feeling of incomplete emptying, and nocturia.  He has had some improvement in Flomax but would like to get off medications and has elected for HOLEP.  He denies any dysuria or gross hematuria.   PMH: Past Medical History:  Diagnosis Date  . Arthritis   . Benign neoplasm of ascending colon   . Degenerative disc disease, cervical   . Esophagitis, reflux 09/29/2005  . Frequency of urination   . GERD (gastroesophageal reflux disease)   . Peyronie's disease    W/ PENILE CURVATURE    Surgical History: Past Surgical History:  Procedure Laterality Date  . ANTERIOR CERVICAL DECOMP/DISCECTOMY FUSION N/A 01/25/2018   Procedure: ANTERIOR CERVICAL DECOMPRESSION/DISCECTOMY FUSION 1 LEVEL C7-T1, T1-T2;  Surgeon: Deetta Perla, MD;  Location: [facility];  Service: Neurosurgery;  Laterality: N/A;  . ANTERIOR CERVICAL DISCECTOMY  01/25/2018   Dr. Lacinda Axon, ACDF ARTHRODESIS, OSTEOPHYTECTOMY &DECOMPRESSION OF SPINAL CORD AND/ORNERVE ROOTS; BELOW C2--C7-T1  . COLONOSCOPY WITH PROPOFOL N/A 09/14/2014   Procedure: COLONOSCOPY WITH PROPOFOL;  Surgeon: Lucilla Lame, MD;  Location: [place];  Service: Endoscopy;  Laterality: N/A;  . HERNIA REPAIR Bilateral 04/09/2015   Bard 3D Max mesh for bilateral inguinal hernias, primary repair of umbilical henria.   . INGUINAL HERNIA REPAIR Bilateral 04/09/2015   Procedure: LAPAROSCOPIC INGUINAL HERNIA;  Surgeon: Robert Bellow, MD;  Location: [facility];  Service: General;  Laterality: Bilateral;  . NESBIT PROCEDURE N/A 06/28/2012   Procedure: 16 DOT PLACTATION PROCEDURE;   Surgeon: Claybon Jabs, MD;  Location[facility];  Service: Urology;  Laterality: N/A;  . Peyronies Disease  06/2012  . POLYPECTOMY  09/14/2014   Procedure: POLYPECTOMY;  Surgeon: Lucilla Lame, MD;  Location: [place];  Service: Endoscopy;;  . PROSTATE BIOPSY  03-02-15   neg  . THORACIC DISCECTOMY  01/25/2018   Dr. Lacinda Axon, ARTHRODESIS ANTERIOR INTERBODY W/DISCECTOMY ADDITIONAL LEVEL T1-T2  . UMBILICAL HERNIA REPAIR N/A 04/09/2015   Procedure: HERNIA REPAIR UMBILICAL ADULT;  Surgeon: Robert Bellow, MD;  Location: [facility];  Service: General;  Laterality: N/A;    Allergies: No Known Allergies  Family History: Family History  Problem Relation Age of Onset  . Prostate cancer Neg Hx   . Bladder Cancer Neg Hx   . Kidney cancer Neg Hx     Social History:  reports that he has never smoked. He has never used smokeless tobacco. He reports that he does not drink alcohol or use drugs.  ROS: Please see flowsheet from today's date for complete review of systems.  Physical Exam: BP 135/87   Pulse 71   Temp 98.7 F (37.1 C) (Tympanic)   Resp 18   SpO2 100%    Constitutional:  Alert and oriented, No acute distress. Cardiovascular: Regular rate and rhythm Respiratory: Clear to auscultation bilaterally GI: Abdomen is soft, nontender, nondistended, no abdominal masses Lymph: No cervical or inguinal lymphadenopathy. Skin: No rashes, bruises or suspicious lesions. Neurologic: Grossly intact, no focal deficits, moving all 4 extremities. Psychiatric: Normal mood and affect.  Laboratory Data: Urine culture 2/2 with low growth  of Enterococcus and E. coli, has been on culture appropriate nitrofurantoin  Assessment & Plan:   64 year old healthy male with history of elevated PSA and negative prostate MRI with 87 g prostate and BPH symptoms of weak stream, straining, and incomplete emptying.  We discussed the risks and benefits of HOLEP at length.  The procedure requires  general anesthesia and takes 2 to 3 hours, and a holmium laser is used to enucleate the prostate and push this tissue into the bladder.  A morcellator is then used to remove this tissue, which is sent for pathology.  Majority of patients are able to discharge the same day with a catheter in place for 2 to 3 days, and will follow-up in clinic for a voiding trial.  Approximately 10% of patients will be admitted overnight to monitor the urine, or if they have multiple comorbidities.  We specifically discussed the risks of bleeding, infection, retrograde ejaculation, temporary urgency and urge incontinence, very low risk of long-term incontinence, and possible need for additional procedures.   Micheal Madrid, MD 02/17/2019  [facility] [address], [place], [place] [address] [phone]

## 2019-02-17 NOTE — Discharge Instructions (Signed)
Norco 7.5-325mg  taken Feb 11 at 12:02pm  AMBULATORY SURGERY  DISCHARGE INSTRUCTIONS   1) The drugs that you were given will stay in your system until tomorrow so for the next 24 hours you should not:  A) Drive an automobile B) Make any legal decisions C) Drink any alcoholic beverage   2) You may resume regular meals tomorrow.  Today it is better to start with liquids and gradually work up to solid foods.  You may eat anything you prefer, but it is better to start with liquids, then soup and crackers, and gradually work up to solid foods.   3) Please notify your doctor immediately if you have any unusual bleeding, trouble breathing, redness and pain at the surgery site, drainage, fever, or pain not relieved by medication.    4) Additional Instructions:        Please contact your physician with any problems or Same Day Surgery at [phone], Monday through Friday 6 am to 4 pm, or [place] at [facility] number at [phone].

## 2019-02-18 ENCOUNTER — Encounter: Payer: Self-pay | Admitting: Physician Assistant

## 2019-02-18 ENCOUNTER — Telehealth: Payer: Self-pay

## 2019-02-18 ENCOUNTER — Ambulatory Visit (INDEPENDENT_AMBULATORY_CARE_PROVIDER_SITE_OTHER): Payer: BC Managed Care – PPO | Admitting: Physician Assistant

## 2019-02-18 VITALS — BP 148/97 | HR 74 | Ht 75.0 in | Wt 185.0 lb

## 2019-02-18 DIAGNOSIS — T839XXA Unspecified complication of genitourinary prosthetic device, implant and graft, initial encounter: Secondary | ICD-10-CM | POA: Diagnosis not present

## 2019-02-18 DIAGNOSIS — K5903 Drug induced constipation: Secondary | ICD-10-CM

## 2019-02-18 LAB — SURGICAL PATHOLOGY

## 2019-02-18 NOTE — Progress Notes (Signed)
02/18/2019 11:19 AM   Micheal Mccoy Mar 21, 1955 [id number]  CC: Catheter concerns  HPI: Micheal Mccoy is a 64 y.o. male who presents today for evaluation of his Foley catheter.  He is POD 1 from HOLEP with Dr. Diamantina Providence with a 24 French three-way hematuria catheter in place.  Operative notes indicate 60 cc of sterile water in the balloon.  Today, he reports that he has been having intermittent painless lower abdominal pressure associated with some bloody leakage around his Foley catheter tubing.  He notes that he understands this to be normal.  However, he has also seen his Foley catheter tubing moving in and out at his urethral meatus and he is concerned that the catheter may be coming out.  Additionally, he states he has seen his Foley catheter tubing bulge when he bears down to have a bowel movement.  He does report some constipation since surgery.  He has not been taking any medications postoperatively, including pain medications.  Foley catheter draining cherry colored urine today without visible clot material.  PMH: Past Medical History:  Diagnosis Date  . Arthritis   . Benign neoplasm of ascending colon   . Degenerative disc disease, cervical   . Esophagitis, reflux 09/29/2005  . Frequency of urination   . GERD (gastroesophageal reflux disease)   . Peyronie's disease    W/ PENILE CURVATURE    Surgical History: Past Surgical History:  Procedure Laterality Date  . ANTERIOR CERVICAL DECOMP/DISCECTOMY FUSION N/A 01/25/2018   Procedure: ANTERIOR CERVICAL DECOMPRESSION/DISCECTOMY FUSION 1 LEVEL C7-T1, T1-T2;  Surgeon: Deetta Perla, MD;  Location: [facility];  Service: Neurosurgery;  Laterality: N/A;  . ANTERIOR CERVICAL DISCECTOMY  01/25/2018   Dr. Lacinda Axon, ACDF ARTHRODESIS, OSTEOPHYTECTOMY &DECOMPRESSION OF SPINAL CORD AND/ORNERVE ROOTS; BELOW C2--C7-T1  . COLONOSCOPY WITH PROPOFOL N/A 09/14/2014   Procedure: COLONOSCOPY WITH PROPOFOL;  Surgeon: Lucilla Lame, MD;  Location: [place];  Service: Endoscopy;  Laterality: N/A;  . HERNIA REPAIR Bilateral 04/09/2015   Bard 3D Max mesh for bilateral inguinal hernias, primary repair of umbilical henria.   Marland Kitchen HOLEP-LASER ENUCLEATION OF THE PROSTATE WITH MORCELLATION N/A 02/17/2019   Procedure: HOLEP-LASER ENUCLEATION OF THE PROSTATE WITH MORCELLATION;  Surgeon: Billey Co, MD;  Location: [facility];  Service: Urology;  Laterality: N/A;  . INGUINAL HERNIA REPAIR Bilateral 04/09/2015   Procedure: LAPAROSCOPIC INGUINAL HERNIA;  Surgeon: Robert Bellow, MD;  Location: [facility];  Service: General;  Laterality: Bilateral;  . NESBIT PROCEDURE N/A 06/28/2012   Procedure: 16 DOT PLACTATION PROCEDURE;  Surgeon: Claybon Jabs, MD;  Location: [facility];  Service: Urology;  Laterality: N/A;  . Peyronies Disease  06/2012  . POLYPECTOMY  09/14/2014   Procedure: POLYPECTOMY;  Surgeon: Lucilla Lame, MD;  Location: [place];  Service: Endoscopy;;  . PROSTATE BIOPSY  03-02-15   neg  . THORACIC DISCECTOMY  01/25/2018   Dr. Lacinda Axon, ARTHRODESIS ANTERIOR INTERBODY W/DISCECTOMY ADDITIONAL LEVEL T1-T2  . UMBILICAL HERNIA REPAIR N/A 04/09/2015   Procedure: HERNIA REPAIR UMBILICAL ADULT;  Surgeon: Robert Bellow, MD;  Location: [facility];  Service: General;  Laterality: N/A;    Home Medications:  Allergies as of 02/18/2019   No Known Allergies     Medication List       Accurate as of February 18, 2019 11:19 AM. If you have any questions, ask your nurse or doctor.        HYDROcodone-acetaminophen 5-325 MG tablet Commonly known as: NORCO/VICODIN Take 1 tablet  by mouth every 4 (four) hours as needed for up to 3 days for moderate pain.   nitrofurantoin (macrocrystal-monohydrate) 100 MG capsule Commonly known as: MACROBID Take 1 capsule (100 mg total) by mouth 2 (two) times daily for 7 days.   sildenafil 20 MG tablet Commonly known as: REVATIO Take 1 - 5 tabs PO daily prn       Allergies:  No Known  Allergies  Family History: Family History  Problem Relation Age of Onset  . Prostate cancer Neg Hx   . Bladder Cancer Neg Hx   . Kidney cancer Neg Hx     Social History:   reports that he has never smoked. He has never used smokeless tobacco. He reports that he does not drink alcohol or use drugs.   Physical Exam: BP (!) 148/97   Pulse 74   Ht 6\' 3"  (1.905 m)   Wt 185 lb (83.9 kg)   BMI 23.12 kg/m   Constitutional:  Alert and oriented, no acute distress, nontoxic appearing HEENT: [place], AT Cardiovascular: No clubbing, cyanosis, or edema Respiratory: Normal respiratory effort, no increased work of breathing GU: Circumcised penis Skin: No rashes, bruises or suspicious lesions Neurologic: Grossly intact, no focal deficits, moving all 4 extremities Psychiatric: Normal mood and affect  Assessment & Plan:   1. Foley catheter problem, initial encounter ([place]) I advanced the Foley catheter slightly in clinic today and pulled back, meeting resistance from the balloon at the level of the bladder neck.  I explained to the patient that the catheter is placed properly, as evidenced by it draining urinary output.  Additionally, I irrigated his Foley catheter in clinic today; see separate procedure note for details.  Ultimately, I verified that the catheter was placed properly and draining appropriately.  Additionally, I explained that the intermittent lower abdominal pressure he is feeling is likely consistent with bladder spasm.  As these are not painful, will defer anticholinergics at this time given plans for catheter removal on Monday.  He expressed understanding.  2. Drug induced constipation I counseled him that this is likely secondary to anesthesia.  I counseled him to start senna 2 tablets daily times approximately 5 days to assist with bowel movements.  He expressed understanding.  Return if symptoms worsen or fail to improve.  Debroah Loop, PA-C  [facility] [address] [place], [place] [address] [phone]

## 2019-02-18 NOTE — Progress Notes (Signed)
Bladder Irrigation  Due to concerns for clogged catheter patient is present today for a bladder irrigation. Patient was cleaned and prepped in a sterile fashion. 120 ml of sterile water was irrigated into the bladder with a 5ml Toomey syringe through the irrigation port of the 24Fr 3-way Foley catheter in place.  Clear output was immediately noted into the patient's leg bag. Leg bag was subsequently emptied of >113mL output. Catheter is draining fine; no complications noted. Patient tolerated well.   Performed by: Debroah Loop, PA-C

## 2019-02-18 NOTE — Telephone Encounter (Signed)
Incoming call from pt who states that he is very worried about his catheter. Pt states that he believes his catheter is "coming out" gave pt verbal education foley catheters and balloon placement. Pt still adamant that catheter is coming out. Pt also c/o leakage around the catheter pt was informed that this is was normal as well. Pt requests to be seen in office. Pt scheduled.

## 2019-02-18 NOTE — Patient Instructions (Signed)
Start taking Senna (available by the brand names Senokot or Ex-Lax) 2 tablets daily for about the next 5 days to help with moving your bowels.

## 2019-02-21 ENCOUNTER — Encounter: Payer: Self-pay | Admitting: Physician Assistant

## 2019-02-21 ENCOUNTER — Ambulatory Visit (INDEPENDENT_AMBULATORY_CARE_PROVIDER_SITE_OTHER): Payer: BC Managed Care – PPO | Admitting: Physician Assistant

## 2019-02-21 ENCOUNTER — Other Ambulatory Visit: Payer: Self-pay

## 2019-02-21 ENCOUNTER — Telehealth: Payer: Self-pay

## 2019-02-21 VITALS — BP 127/90 | HR 78 | Ht 72.0 in | Wt 185.0 lb

## 2019-02-21 DIAGNOSIS — N138 Other obstructive and reflux uropathy: Secondary | ICD-10-CM

## 2019-02-21 DIAGNOSIS — N401 Enlarged prostate with lower urinary tract symptoms: Secondary | ICD-10-CM

## 2019-02-21 NOTE — Progress Notes (Signed)
Fill and Pull Catheter Removal  Patient is present today for a catheter removal.  Patient was cleaned and prepped in a sterile fashion 164ml of sterile saline was instilled into the bladder when the patient felt the urge to urinate. 80ml of water was then drained from the balloon.  A 24FR three-way foley cath was removed from the bladder complications were noted as: Bladder spasm with saline installation with immediate efflux of an unmeasured volume of urine around the Foley catheter tubing .  Patient as then given some time to void on their own.  Patient reported no longer needing to void.  Patient tolerated well.  Performed by: Debroah Loop, PA-C   Follow up/ Additional notes: Shared negative pathology results with the patient; he expressed understanding.  I counseled the patient on signs and symptoms of urinary retention, including lower abdominal pain, lumbar pain, abdominal distention, and the inability to urinate.  I advised him to contact the office for assistance if he develops these symptoms during routine office hours, 8 AM to 5 PM Monday through Friday.  If outside those hours, I advised him  to proceed to the emergency department. He expressed understanding.

## 2019-02-21 NOTE — Telephone Encounter (Signed)
Pt informed of path at today's appt.

## 2019-02-21 NOTE — Telephone Encounter (Signed)
-----   Message from Billey Co, MD sent at 02/18/2019 11:23 AM EST ----- Doristine Devoid news, no cancer seen on prostate chips, follow-up as scheduled for catheter removal.  Please encouraged him to continue to push fluids and check that his catheter is clearing up.  Micheal Madrid, MD 02/18/2019

## 2019-03-07 ENCOUNTER — Encounter: Payer: Self-pay | Admitting: Urology

## 2019-03-07 ENCOUNTER — Other Ambulatory Visit: Payer: Self-pay

## 2019-03-07 ENCOUNTER — Emergency Department: Payer: BC Managed Care – PPO

## 2019-03-07 ENCOUNTER — Emergency Department
Admission: EM | Admit: 2019-03-07 | Discharge: 2019-03-07 | Disposition: A | Discharge status: Home / Self Care | Payer: BC Managed Care – PPO | Attending: Student | Admitting: Student

## 2019-03-07 ENCOUNTER — Other Ambulatory Visit
Admission: RE | Admit: 2019-03-07 | Discharge: 2019-03-07 | Disposition: A | Discharge status: Home / Self Care | Payer: BC Managed Care – PPO | Source: Home / Self Care | Attending: Urology | Admitting: Urology

## 2019-03-07 ENCOUNTER — Ambulatory Visit (INDEPENDENT_AMBULATORY_CARE_PROVIDER_SITE_OTHER): Payer: BC Managed Care – PPO | Admitting: Urology

## 2019-03-07 ENCOUNTER — Telehealth: Payer: Self-pay

## 2019-03-07 VITALS — BP 150/89 | HR 70 | Ht 75.0 in | Wt 185.0 lb

## 2019-03-07 DIAGNOSIS — N390 Urinary tract infection, site not specified: Secondary | ICD-10-CM

## 2019-03-07 DIAGNOSIS — N401 Enlarged prostate with lower urinary tract symptoms: Secondary | ICD-10-CM

## 2019-03-07 DIAGNOSIS — N138 Other obstructive and reflux uropathy: Secondary | ICD-10-CM | POA: Diagnosis not present

## 2019-03-07 DIAGNOSIS — Z79899 Other long term (current) drug therapy: Secondary | ICD-10-CM | POA: Diagnosis not present

## 2019-03-07 DIAGNOSIS — M545 Low back pain, unspecified: Secondary | ICD-10-CM

## 2019-03-07 DIAGNOSIS — N3001 Acute cystitis with hematuria: Secondary | ICD-10-CM

## 2019-03-07 LAB — CBC
HCT: 44.1 % (ref 39.0–52.0)
Hemoglobin: 14.4 g/dL (ref 13.0–17.0)
MCH: 26.7 pg (ref 26.0–34.0)
MCHC: 32.7 g/dL (ref 30.0–36.0)
MCV: 81.8 fL (ref 80.0–100.0)
Platelets: 285 10*3/uL (ref 150–400)
RBC: 5.39 MIL/uL (ref 4.22–5.81)
RDW: 13.6 % (ref 11.5–15.5)
WBC: 3.7 10*3/uL — ABNORMAL LOW (ref 4.0–10.5)
nRBC: 0 % (ref 0.0–0.2)

## 2019-03-07 LAB — URINALYSIS, COMPLETE (UACMP) WITH MICROSCOPIC
Bilirubin Urine: NEGATIVE
Bilirubin Urine: NEGATIVE
Glucose, UA: NEGATIVE mg/dL
Glucose, UA: NEGATIVE mg/dL
Ketones, ur: NEGATIVE mg/dL
Ketones, ur: NEGATIVE mg/dL
Nitrite: NEGATIVE
Nitrite: NEGATIVE
Protein, ur: 30 mg/dL — AB
Protein, ur: 30 mg/dL — AB
RBC / HPF: >50 RBC/hpf (ref 0–5)
RBC / HPF: >50 RBC/hpf — ABNORMAL HIGH (ref 0–5)
Specific Gravity, Urine: 1.02 (ref 1.005–1.030)
Specific Gravity, Urine: 1.013 (ref 1.005–1.030)
Squamous Epithelial / HPF: NONE SEEN (ref 0–5)
Squamous Epithelial / HPF: NONE SEEN (ref 0–5)
pH: 7 (ref 5.0–8.0)
pH: 6 (ref 5.0–8.0)

## 2019-03-07 LAB — BASIC METABOLIC PANEL
Anion gap: 3 — ABNORMAL LOW (ref 5–15)
BUN: 11 mg/dL (ref 8–23)
CO2: 32 mmol/L (ref 22–32)
Calcium: 9.3 mg/dL (ref 8.9–10.3)
Chloride: 104 mmol/L (ref 98–111)
Creatinine, Ser: 0.86 mg/dL (ref 0.61–1.24)
GFR calc Af Amer: >60 mL/min (ref >60)
GFR calc non Af Amer: >60 mL/min (ref >60)
Glucose, Bld: 100 mg/dL — ABNORMAL HIGH (ref 70–99)
Potassium: 4.4 mmol/L (ref 3.5–5.1)
Sodium: 139 mmol/L (ref 135–145)

## 2019-03-07 LAB — BLADDER SCAN AMB NON-IMAGING

## 2019-03-07 MED ORDER — ACETAMINOPHEN 500 MG PO TABS
1000.0000 mg | ORAL_TABLET | Freq: Once | ORAL | Status: AC
  Administered 2019-03-07: 1000 mg via ORAL
  Filled 2019-03-07: qty 2

## 2019-03-07 MED ORDER — METHOCARBAMOL 500 MG PO TABS: 500.0000 mg | ORAL_TABLET | Freq: Three times a day (TID) | ORAL | 0 refills | Status: AC | PRN

## 2019-03-07 MED ORDER — SODIUM CHLORIDE 0.9 % IV SOLN
1.0000 g | Freq: Once | INTRAVENOUS | Status: AC
  Administered 2019-03-07: 1 g via INTRAVENOUS
  Filled 2019-03-07: qty 10

## 2019-03-07 MED ORDER — METHOCARBAMOL 500 MG PO TABS
500.0000 mg | ORAL_TABLET | Freq: Once | ORAL | Status: AC
  Administered 2019-03-07: 14:00:00 500 mg via ORAL
  Filled 2019-03-07 (×2): qty 1

## 2019-03-07 MED ORDER — GADOBUTROL 1 MMOL/ML IV SOLN
8.0000 mL | Freq: Once | INTRAVENOUS | Status: AC | PRN
  Administered 2019-03-07: 15:00:00 8 mL via INTRAVENOUS

## 2019-03-07 MED ORDER — SULFAMETHOXAZOLE-TRIMETHOPRIM 800-160 MG PO TABS: 1.0000 | ORAL_TABLET | Freq: Two times a day (BID) | ORAL | 0 refills | Status: DC

## 2019-03-07 MED ORDER — LIDOCAINE 5 % EX PTCH
1.0000 | MEDICATED_PATCH | CUTANEOUS | Status: DC
  Administered 2019-03-07: 1 via TRANSDERMAL
  Filled 2019-03-07: qty 1

## 2019-03-07 NOTE — ED Notes (Signed)
First Nurse Note:  This RN received call from [place]; states they are sending patient over for rule out Cauda Equina Syndrome.  Pt ambulatory to triage, NAD noted upon arrival.

## 2019-03-07 NOTE — Progress Notes (Signed)
03/07/2019 12:21 PM   Micheal Mccoy 1955-04-14 [id number]  Referring provider: [name], [place],  [place] [address]  Chief Complaint  Patient presents with  . Dysuria    UTI?    HPI: [name] is a 64 year old male who is status post HoLEP on 02/17/2019 with Dr. Diamantina Providence who presents today with possible UTI.    He states after returning from [facility] on Friday afternoon (03/03/2018), his left lower back started to become uncomfortable.  The uncomfortableness increased throughout the rest of the afternoon and became almost unbearable Friday evening.  On Saturday, the pain had increased to the point where he was considering seeking treatment in the emergency room.  At that time, he noted that he was having dysuria, penile burning, chills, gross hematuria, passage of clots and urinary incontinence.  He states he went through 6 pads last night and the urine was just coming out.    The pain in the left lower back does not radiate.  10/10 pain.  He states he has difficulty with pulling his pants up, having a hard time walking, his legs feel weak, is painful for him to lay down and painful for him to rise to a standing position.  The pain is exacerbated by movement and lessened by remaining still.  He denies any numbness in the perineum or loss of stool.  Patient denies any modifying or aggravating factors.  Patient denies suprapubic/flank pain.  Patient denies any fevers, nausea or vomiting.   His UA is straw hazy, specific gravity 1.020, pH 7.0, moderate blood, 30 protein, moderate leukocyte, 11-20 WBCs, greater than 50 RBCs and many bacteria.  His PVR is 0.  Prostate chips from HoLEP were benign.    PMH: Past Medical History:  Diagnosis Date  . Arthritis   . Benign neoplasm of ascending colon   . Degenerative disc disease, cervical   . Esophagitis, reflux 09/29/2005  . Frequency of urination   . GERD (gastroesophageal reflux disease)   .  Peyronie's disease    W/ PENILE CURVATURE    Surgical History: Past Surgical History:  Procedure Laterality Date  . ANTERIOR CERVICAL DECOMP/DISCECTOMY FUSION N/A 01/25/2018   Procedure: ANTERIOR CERVICAL DECOMPRESSION/DISCECTOMY FUSION 1 LEVEL C7-T1, T1-T2;  Surgeon: Deetta Perla, MD;  Location: [facility];  Service: Neurosurgery;  Laterality: N/A;  . ANTERIOR CERVICAL DISCECTOMY  01/25/2018   Dr. Lacinda Axon, ACDF ARTHRODESIS, OSTEOPHYTECTOMY &DECOMPRESSION OF SPINAL CORD AND/ORNERVE ROOTS; BELOW C2--C7-T1  . COLONOSCOPY WITH PROPOFOL N/A 09/14/2014   Procedure: COLONOSCOPY WITH PROPOFOL;  Surgeon: Lucilla Lame, MD;  Location: [place];  Service: Endoscopy;  Laterality: N/A;  . HERNIA REPAIR Bilateral 04/09/2015   Bard 3D Max mesh for bilateral inguinal hernias, primary repair of umbilical henria.   Marland Kitchen HOLEP-LASER ENUCLEATION OF THE PROSTATE WITH MORCELLATION N/A 02/17/2019   Procedure: HOLEP-LASER ENUCLEATION OF THE PROSTATE WITH MORCELLATION;  Surgeon: Billey Co, MD;  Location: [facility];  Service: Urology;  Laterality: N/A;  . INGUINAL HERNIA REPAIR Bilateral 04/09/2015   Procedure: LAPAROSCOPIC INGUINAL HERNIA;  Surgeon: Robert Bellow, MD;  Location: [facility];  Service: General;  Laterality: Bilateral;  . NESBIT PROCEDURE N/A 06/28/2012   Procedure: 16 DOT PLACTATION PROCEDURE;  Surgeon: Claybon Jabs, MD;  Location: [facility];  Service: Urology;  Laterality: N/A;  . Peyronies Disease  06/2012  . POLYPECTOMY  09/14/2014   Procedure: POLYPECTOMY;  Surgeon: Lucilla Lame, MD;  Location: [facility];  Service: Endoscopy;;  . PROSTATE BIOPSY  03-02-15   neg  . THORACIC DISCECTOMY  01/25/2018   Dr. Lacinda Axon, ARTHRODESIS ANTERIOR INTERBODY W/DISCECTOMY ADDITIONAL LEVEL T1-T2  . UMBILICAL HERNIA REPAIR N/A 04/09/2015   Procedure: HERNIA REPAIR UMBILICAL ADULT;  Surgeon: Robert Bellow, MD;  Location: [facility];  Service: General;  Laterality: N/A;    Home  Medications:  Allergies as of 03/07/2019      Reactions   Aleve [naproxen] Hives      Medication List       Accurate as of March 07, 2019 12:21 PM. If you have any questions, ask your nurse or doctor.        sildenafil 20 MG tablet Commonly known as: REVATIO Take 1 - 5 tabs PO daily prn   sulfamethoxazole-trimethoprim 800-160 MG tablet Commonly known as: BACTRIM DS Take 1 tablet by mouth every 12 (twelve) hours. Started by: Zara Council, PA-C       Allergies:  Allergies  Allergen Reactions  . Aleve [Naproxen] Hives    Family History: Family History  Problem Relation Age of Onset  . Prostate cancer Neg Hx   . Bladder Cancer Neg Hx   . Kidney cancer Neg Hx     Social History:  reports that he has never smoked. He has never used smokeless tobacco. He reports that he does not drink alcohol or use drugs.  ROS: For pertinent review of systems please see history of present illness  Physical Exam: BP (!) 150/89   Pulse 70   Ht 6\' 3"  (1.905 m)   Wt 185 lb (83.9 kg)   BMI 23.12 kg/m   Constitutional:  Well nourished. Alert and oriented, Acute distress. HEENT: [place] AT, mask in place.  Trachea midline, no masses. Cardiovascular: No clubbing, cyanosis, or edema. Respiratory: Normal respiratory effort, no increased work of breathing. GU: No CVA tenderness.  No bladder fullness or masses.  Patient with circumcised phallus. Urethral meatus is patent.  No penile discharge. No penile lesions or rashes.  Skin: No rashes, bruises or suspicious lesions. Neurologic: Grossly intact, no focal deficits, moving all 4 extremities.  Unable to bend over to touch toes.  Can only ambulate by taking small steps.   Psychiatric: Normal mood and affect.  Laboratory Data: Lab Results  Component Value Date   WBC 3.5 (L) 01/14/2018   HGB 15.2 01/14/2018   HCT 48.2 01/14/2018   MCV 83.0 01/14/2018   PLT 211 01/14/2018    Lab Results  Component Value Date   CREATININE 0.90 11/16/2018     Lab Results  Component Value Date   PSA 4.0 03/25/2013       Component Value Date/Time   CHOL 177 11/16/2018 1014   HDL 55 11/16/2018 1014   CHOLHDL 3.2 11/16/2018 1014   LDLCALC 113 (H) 11/16/2018 1014    Lab Results  Component Value Date   AST 15 11/16/2018   Lab Results  Component Value Date   ALT 16 11/16/2018    Urinalysis Component     Latest Ref Rng & Units 03/07/2019  Color, Urine     YELLOW STRAW (A)  Appearance     CLEAR HAZY (A)  Specific Gravity, Urine     1.005 - 1.030 1.020  pH     5.0 - 8.0 7.0  Glucose, UA     NEGATIVE mg/dL NEGATIVE  Hgb urine dipstick     NEGATIVE MODERATE (A)  Bilirubin Urine     NEGATIVE  NEGATIVE  Ketones, ur     NEGATIVE mg/dL NEGATIVE  Protein     NEGATIVE mg/dL 30 (A)  Nitrite     NEGATIVE NEGATIVE  Leukocytes,Ua     NEGATIVE MODERATE (A)  Squamous Epithelial / LPF     0 - 5 NONE SEEN  WBC, UA     0 - 5 WBC/hpf 11-20  RBC / HPF     0 - 5 RBC/hpf >50  Bacteria, UA     NONE SEEN MANY (A)   I have reviewed the labs.   Pertinent Imaging: Results for DAWOUD, HOWLE (MRN [id number]) as of 03/07/2019 12:03  Ref. Range 03/07/2019 11:07  Scan Result Unknown 6ml    Assessment & Plan:    1. Left lower back pain Patient with a history of mild degenerative changes of the lumbar spine seen on 01/17/2015 xray.  Advised him to seek treatment in the ED for pain control and to rule out serious systemic etiologies as patient is complaining of leg weakness and incontinence.   Although we do expect urinary incontinence after a HoLEP, it is concerning that he is having leg weakness and lower back pain as well.    I discussed with Mr. Polkinghorn the possibility of spinal cord compression and that this is a serious condition that needs to be found in the early stages for better outcome.  He understands this and agrees to seek treatment in the ED.    2. UTI Patient will dysuria and penile burning UA grossly infection - Septra sent to  pharmacy Urine culture pending - will adjust antibiotic if appropriate  3. BPH with obstruction/lower urinary tract symptoms BLADDER SCAN AMB NON-IMAGING s/p HoLEP 02/17/2019  Has follow up with Dr. Diamantina Providence on 04/06/2019   Return for Seek treatment in the ED for back pain .  These notes generated with voice recognition software. I apologize for typographical errors.  Zara Council, PA-C  [facility] [address]  [place], [place] [address] [phone]

## 2019-03-07 NOTE — Discharge Instructions (Signed)
Thank you for letting us take care of you in the emergency department today.   Please continue to take any regular, prescribed medications.   New medications we have prescribed:  - Robaxin - muscle relaxer - Pick up the antibiotic sent by your urology doctor as well and take as directed  You should also use ibuprofen and Tylenol as directed on the box to help with your pain. You can obtain the lidocaine patch under the name SalonPas over the counter as well.   Please return to the ER for any new or worsening symptoms.

## 2019-03-07 NOTE — Telephone Encounter (Signed)
Pt calls and states that he believes he possibly has a urinary tract infection. Pt states that he is having flank pain, severe dysuria and chills. Sx begin late Friday evening and have progressively gotten worse. Pt has tried motrin and heating pad to the back with no relief. Advised pt of the need to be seen, pt scheduled in [place] w/ Larene Beach.

## 2019-03-07 NOTE — ED Notes (Signed)
Transported to MRI. abx paused for MRI, will restart when returns.

## 2019-03-07 NOTE — ED Triage Notes (Signed)
Pt states he had a urological procedure to clear out his bladder and prostate and had a urinary catheter for 3 days and is having severe left flank pain., denies N/V.Marland Kitchen states he is having cold chills.

## 2019-03-07 NOTE — ED Provider Notes (Signed)
[facility] Emergency Department Provider Note  ____________________________________________   First MD Initiated Contact with Patient 03/07/19 1314     (approximate)  I have reviewed the triage vital signs and the nursing notes.  History  Chief Complaint Flank Pain    HPI Micheal Mccoy is a 64 y.o. male who presents from the urology clinic for evaluation of low back pain. Patient is s/p HoLEP procedure with urology on 02/17/2019.  He feels like he has been recovering well from this procedure.  He has had some mild urinary incontinence/leakage, for which he is wearing pull ups, which is to be expected.   On Friday, he started to develop low back pain after driving to and from [place].  Pain is located in the left lower back, left lumbar region.  10/10 in severity.  Constant, describes it as almost a pinching or twisting type sensation.  No radiation of the pain.  Pain is worsened with movement, walking.  Pain is improved with rest and laying still.  He feels like the severity of the pain makes his legs weak, but denies any isolated leg weakness.  No numbness or tingling.  No loss of bowel function.  In clinic, UA was notable for infection, prescription for antibiotics sent to his pharmacy.  PVR 0.  Given his presentation, urology recommended evaluation in the emergency department to rule out any cord pathology.    Past Medical Hx Past Medical History:  Diagnosis Date  . Arthritis   . Benign neoplasm of ascending colon   . Degenerative disc disease, cervical   . Esophagitis, reflux 09/29/2005  . Frequency of urination   . GERD (gastroesophageal reflux disease)   . Peyronie's disease    W/ PENILE CURVATURE    Problem List Patient Active Problem List   Diagnosis Date Noted  . S/P cervical spinal fusion 01/25/2018  . Prostate enlargement 02/23/2015  . DDD (degenerative disc disease), lumbar 01/18/2015  . Urinary frequency 01/17/2015  . Neck pain,  chronic 01/17/2015  . History of adenomatous polyp of colon   . Arthritis 08/24/2014  . BPH (benign prostatic hypertrophy) 08/24/2014  . Erectile dysfunction 08/24/2014  . Peyronie's disease 06/28/2012  . Cervicalgia 07/12/2007  . Esophagitis, reflux 09/29/2005  . Cervical spondylosis without myelopathy 01/06/1998    Past Surgical Hx Past Surgical History:  Procedure Laterality Date  . ANTERIOR CERVICAL DECOMP/DISCECTOMY FUSION N/A 01/25/2018   Procedure: ANTERIOR CERVICAL DECOMPRESSION/DISCECTOMY FUSION 1 LEVEL C7-T1, T1-T2;  Surgeon: Deetta Perla, MD;  Location: [facility];  Service: Neurosurgery;  Laterality: N/A;  . ANTERIOR CERVICAL DISCECTOMY  01/25/2018   Dr. Lacinda Axon, ACDF ARTHRODESIS, OSTEOPHYTECTOMY &DECOMPRESSION OF SPINAL CORD AND/ORNERVE ROOTS; BELOW C2--C7-T1  . COLONOSCOPY WITH PROPOFOL N/A 09/14/2014   Procedure: COLONOSCOPY WITH PROPOFOL;  Surgeon: Lucilla Lame, MD;  Location: [facility];  Service: Endoscopy;  Laterality: N/A;  . HERNIA REPAIR Bilateral 04/09/2015   Bard 3D Max mesh for bilateral inguinal hernias, primary repair of umbilical henria.   Marland Kitchen HOLEP-LASER ENUCLEATION OF THE PROSTATE WITH MORCELLATION N/A 02/17/2019   Procedure: HOLEP-LASER ENUCLEATION OF THE PROSTATE WITH MORCELLATION;  Surgeon: Billey Co, MD;  Location: [facility];  Service: Urology;  Laterality: N/A;  . INGUINAL HERNIA REPAIR Bilateral 04/09/2015   Procedure: LAPAROSCOPIC INGUINAL HERNIA;  Surgeon: Robert Bellow, MD;  Location: [facility];  Service: General;  Laterality: Bilateral;  . NESBIT PROCEDURE N/A 06/28/2012   Procedure: 16 DOT PLACTATION PROCEDURE;  Surgeon: Claybon Jabs, MD;  Location:  [facility];  Service: Urology;  Laterality: N/A;  . Peyronies Disease  06/2012  . POLYPECTOMY  09/14/2014   Procedure: POLYPECTOMY;  Surgeon: Lucilla Lame, MD;  Location: [facility];  Service: Endoscopy;;  . PROSTATE BIOPSY  03-02-15   neg  . THORACIC DISCECTOMY   01/25/2018   Dr. Lacinda Axon, ARTHRODESIS ANTERIOR INTERBODY W/DISCECTOMY ADDITIONAL LEVEL T1-T2  . UMBILICAL HERNIA REPAIR N/A 04/09/2015   Procedure: HERNIA REPAIR UMBILICAL ADULT;  Surgeon: Robert Bellow, MD;  Location: [facility];  Service: General;  Laterality: N/A;    Medications Prior to Admission medications   Medication Sig Start Date End Date Taking? Authorizing Provider  sildenafil (REVATIO) 20 MG tablet Take 1 - 5 tabs PO daily prn 11/30/17   Billey Co, MD  sulfamethoxazole-trimethoprim (BACTRIM DS) 800-160 MG tablet Take 1 tablet by mouth every 12 (twelve) hours. 03/07/19   Zara Council A, PA-C    Allergies Aleve [naproxen]  Family Hx Family History  Problem Relation Age of Onset  . Prostate cancer Neg Hx   . Bladder Cancer Neg Hx   . Kidney cancer Neg Hx     Social Hx Social History   Tobacco Use  . Smoking status: Never Smoker  . Smokeless tobacco: Never Used  Substance Use Topics  . Alcohol use: No  . Drug use: No     Review of Systems  Constitutional: Negative for fever, chills. Eyes: Negative for visual changes. ENT: Negative for sore throat. Cardiovascular: Negative for chest pain. Respiratory: Negative for shortness of breath. Gastrointestinal: Negative for nausea, vomiting.  Genitourinary: Positive for leaking. Musculoskeletal: Positive for left lower back pain. Skin: Negative for rash. Neurological: Negative for headaches.   Physical Exam  Vital Signs: ED Triage Vitals  Enc Vitals Group     BP 03/07/19 1207 (!) 152/90     Pulse Rate 03/07/19 1207 69     Resp 03/07/19 1207 16     Temp 03/07/19 1211 98.6 F (37 C)     Temp Source 03/07/19 1207 Oral     SpO2 03/07/19 1207 100 %     Weight 03/07/19 1208 180 lb (81.6 kg)     Height 03/07/19 1208 6\' 3"  (1.905 m)     Head Circumference --      Peak Flow --      Pain Score 03/07/19 1207 10     Pain Loc --      Pain Edu? --      Excl. in [place]? --     Constitutional: Alert and  oriented.  Head: Normocephalic. Atraumatic. Eyes: Conjunctivae clear. Sclera anicteric. Nose: No congestion. No rhinorrhea. Mouth/Throat: Wearing mask.  Neck: No stridor.   Cardiovascular: Normal rate, regular rhythm. Extremities well perfused. Respiratory: Normal respiratory effort.  Lungs CTAB. Gastrointestinal: Soft. Non-tender. Non-distended.  Musculoskeletal: No lower extremity edema. No deformities. Back: No midline spinal tenderness.  LEFT lumbar paraspinal musculature tender to palpation.  Low back pain reproducible with RIGHT straight leg raise. Neurologic:  Normal speech and language. No gross focal neurologic deficits are appreciated. BLE strength 5/5 and symmetric, including hip flexion/extension, knee flexion/extension, dorsiflexion/plantar flexion. SILT. Skin: Skin is warm, dry and intact. No rash noted. Psychiatric: Mood and affect are appropriate for situation.   Radiology  MRI:  IMPRESSION:  No significant abnormality of the lumbar spine.    Procedures  Procedure(s) performed (including critical care):  Procedures   Initial Impression / Assessment and Plan / ED Course  63 y.o. male  who presents to the ED with low back pain, UTI, urinary leaking.  Urinary leaking/incontinence is to be expected after his recent procedure, though this does cloudy the picture with his concomitant low back pain as well.  Presentation seems most consistent with MSK/sciatica in etiology based on history and exam. However, given above, will proceed with MRI to rule out any acute cord pathology.  Could consider early pyelonephritis, though reassuringly he has no fevers or vomiting.  Will give first dose of IV antibiotics while in the emergency department for UTI noted in clinic and on repeat labs here.  MRI without acute abnormalities.  Updated patient on results.  He reports significant improvement in his pain after acetaminophen, Robaxin, lidocaine patch.  We will plan for discharge with  Rx for Robaxin and advised over-the-counter lidocaine patches as needed for further symptom control.  Urology clinic already sent his prescription for antibiotics to his pharmacy.  Patient is comfortable with the plan and discharge.   Final Clinical Impression(s) / ED Diagnosis  Final diagnoses:  Acute left-sided low back pain without sciatica  Urinary tract infection in elderly patient       Note:  This document was prepared using Dragon voice recognition software and may include unintentional dictation errors.   Lilia Pro., MD 03/07/19 [phone]

## 2019-03-08 ENCOUNTER — Telehealth: Payer: Self-pay | Admitting: Family Medicine

## 2019-03-08 LAB — URINE CULTURE: Culture: NO GROWTH

## 2019-03-08 NOTE — Telephone Encounter (Signed)
-----   Message from Nori Riis, PA-C sent at 03/08/2019  1:32 PM EST ----- Please let Mr. Bruzek know that his urine culture was negative.  Is he feeling better?

## 2019-03-08 NOTE — Telephone Encounter (Signed)
Patient notified and voiced understanding. He states he is still having the pain in the same spot on the left side. He said when he went to the ER they gave him pain medicine and it helped for a few days.

## 2019-03-09 ENCOUNTER — Telehealth: Payer: Self-pay | Admitting: Family Medicine

## 2019-03-09 NOTE — Telephone Encounter (Signed)
-----   Message from Nori Riis, PA-C sent at 03/09/2019  8:50 AM EST ----- Please let Mr. Reichenberger know that his urine culture was negative for infection.  I would have him reach out to Dr. Caryn Section regarding his left back pain.  Is he still having urinary symptoms?

## 2019-03-09 NOTE — Telephone Encounter (Signed)
Patient notified and will call Dr. Caryn Section. He is not having urinary symptoms just the back pain. Pain medication makes the pain go away.

## 2019-03-14 NOTE — Progress Notes (Signed)
        Patient: Micheal Mccoy Male    DOB: [birth year]   64 y.o.   MRN: [id number] Visit Date: 03/15/2019  Today's Provider: Lelon Huh, MD   Chief Complaint  Patient presents with  . Back Pain    ER follow up    Subjective:      Follow up ER visit  Patient was seen in ER for Acute left-sided low back pain without sciatica on 03/07/2019. He unremarkable MRI of lumbar spine He was sent to ED by his urologist.  He was treated for UTI and back pain. He had large blood, >50 RBCs, and leukocytes, 21-50 WBCs on u/a Treatment for this included  Rx for Robaxin and patient was advised to use over-the-counter lidocaine patches as needed for further symptom control. He reports good compliance with treatment.  He reports this condition is slightly Improved, but still experiencing pain when sitting downPatient was contacted by Urology and advised that his urine culture was negative for infection. Patient was told to follow up with his PCP regarding his left back pain. Was sent home with prescription for methocarbamol.   He does have follow up scheduled with Dr. Glori Luis on 04/06/2019  Started having lower left back pain about a week or two after prostate surgery. No radiation.   ------------------------------------------------------------------------------------   Allergies  Allergen Reactions  . Aleve [Naproxen] Hives     Current Outpatient Medications:  .  sildenafil (REVATIO) 20 MG tablet, Take 1 - 5 tabs PO daily prn, Disp: 30 tablet, Rfl: 3  Review of Systems  Constitutional: Negative.   Respiratory: Negative.   Cardiovascular: Negative.   Musculoskeletal: Positive for back pain.    Social History   Tobacco Use  . Smoking status: Never Smoker  . Smokeless tobacco: Never Used  Substance Use Topics  . Alcohol use: No      Objective:   BP 123/79 (BP Location: Right Arm, Patient Position: Sitting, Cuff Size: Normal)   Pulse 68   Temp (!) 97.1 F (36.2 C) (Temporal)    Wt 193 lb 12.8 oz (87.9 kg)   BMI 24.22 kg/m  Vitals:   03/15/19 0852  BP: 123/79  Pulse: 68  Temp: (!) 97.1 F (36.2 C)  TempSrc: Temporal  Weight: 193 lb 12.8 oz (87.9 kg)  Body mass index is 24.22 kg/m.   Physical Exam  Mild swelling of left para lumbar muscles.     Assessment & Plan    1. Muscle spasm of back Now mostly resolved. He still has a few methocarbamol left. Recommended back stretching exercises. If sx flare back he is to apply ice and may request refill for methocarbimol  2. Erectile dysfunction, unspecified erectile dysfunction type  - sildenafil (VIAGRA) 100 MG tablet; Take 0.5-1 tablets (50-100 mg total) by mouth daily as needed for erectile dysfunction.  Dispense: 30 tablet; Refill: 2 no    Lelon Huh, MD  [facility]

## 2019-03-15 ENCOUNTER — Encounter: Payer: Self-pay | Admitting: Family Medicine

## 2019-03-15 ENCOUNTER — Ambulatory Visit (INDEPENDENT_AMBULATORY_CARE_PROVIDER_SITE_OTHER): Payer: BC Managed Care – PPO | Admitting: Family Medicine

## 2019-03-15 ENCOUNTER — Other Ambulatory Visit: Payer: Self-pay | Admitting: Family Medicine

## 2019-03-15 ENCOUNTER — Other Ambulatory Visit: Payer: Self-pay

## 2019-03-15 VITALS — BP 123/79 | HR 68 | Temp 97.1°F | Wt 193.8 lb

## 2019-03-15 DIAGNOSIS — N529 Male erectile dysfunction, unspecified: Secondary | ICD-10-CM | POA: Diagnosis not present

## 2019-03-15 DIAGNOSIS — M62830 Muscle spasm of back: Secondary | ICD-10-CM | POA: Diagnosis not present

## 2019-03-15 MED ORDER — SILDENAFIL CITRATE 100 MG PO TABS: 50.0000 mg | ORAL_TABLET | Freq: Every day | ORAL | 2 refills | Status: DC | PRN

## 2019-03-15 MED ORDER — SILDENAFIL CITRATE 20 MG PO TABS: ORAL_TABLET | ORAL | 3 refills | Status: DC

## 2019-03-15 NOTE — Telephone Encounter (Signed)
Requested medication (s) are due for refill today: refilled today by Dr Caryn Section  Requested medication (s) are on the active medication list: yes  Last refill: today  Future visit scheduled:No  Notes to clinic:  Pharmacy requesting alternative for insurance Also sig instructions See request.    Requested Prescriptions  Pending Prescriptions Disp Refills   vardenafil (LEVITRA) 20 MG tablet [Pharmacy Med Name: VARDENAFIL HCL 20 MG TABLET] 10 tablet 0    Sig: Please specify directions, refills and quantity      Urology: Erectile Dysfunction Agents Passed - 03/15/2019  9:18 AM      Passed - Last BP in normal range    BP Readings from Last 1 Encounters:  03/15/19 123/79          Passed - Valid encounter within last 12 months    Recent Outpatient Visits           Today Muscle spasm of back   [facility] [name], MD   3 months ago Annual physical exam   [facility] [name], MD   1 year ago Cervical spondylosis without myelopathy   [facility] [name], MD   3 years ago Annual physical exam   [facility] [name], MD   4 years ago Urinary frequency   [facility] [name], MD       Future Appointments             In 3 weeks Diamantina Providence, Herbert Seta, MD [place]   In 2 months Diamantina [facility], Herbert Seta, [place]

## 2019-03-18 MED ORDER — VARDENAFIL HCL 20 MG PO TABS: 20.0000 mg | ORAL_TABLET | Freq: Every day | ORAL | 5 refills | Status: DC | PRN

## 2019-03-18 NOTE — Addendum Note (Signed)
Addended by: [name] on: 03/18/2019 07:51 AM   Modules accepted: Orders

## 2019-04-06 ENCOUNTER — Ambulatory Visit: Payer: BC Managed Care – PPO | Admitting: Urology

## 2019-04-06 ENCOUNTER — Ambulatory Visit (INDEPENDENT_AMBULATORY_CARE_PROVIDER_SITE_OTHER): Payer: BC Managed Care – PPO | Admitting: Urology

## 2019-04-06 ENCOUNTER — Other Ambulatory Visit: Payer: Self-pay

## 2019-04-06 VITALS — BP 128/88 | HR 65 | Ht 75.0 in | Wt 185.0 lb

## 2019-04-06 DIAGNOSIS — N138 Other obstructive and reflux uropathy: Secondary | ICD-10-CM

## 2019-04-06 DIAGNOSIS — N529 Male erectile dysfunction, unspecified: Secondary | ICD-10-CM

## 2019-04-06 DIAGNOSIS — N401 Enlarged prostate with lower urinary tract symptoms: Secondary | ICD-10-CM

## 2019-04-06 LAB — BLADDER SCAN AMB NON-IMAGING: Scan Result: 0

## 2019-04-06 MED ORDER — TADALAFIL 10 MG PO TABS: 10.0000 mg | ORAL_TABLET | Freq: Every day | ORAL | 11 refills | Status: DC | PRN

## 2019-04-06 NOTE — Progress Notes (Signed)
   04/06/2019 11:45 AM   [name] March 18, 1955 [id number]  Reason for visit: Follow up   HPI: I saw Mr. Talbot in urology clinic today for follow-up after undergoing HOLEP on 02/17/2019 for BPH symptoms of weak stream, straining, feeling of incomplete emptying, and nocturia.  87 g were removed showing only benign prostatic tissue.  He reports he is voiding with a very strong stream, however continues to have bothersome urgency, frequency, and some urge incontinence requiring depends.  He is also having some pelvic pain and intermittent dysuria.  He is drinking massive quantities of fluids.  He denies any hematuria.  Urinalysis today with 11-30 WBCs, 11-30 RBCs, few bacteria, no yeast, nitrite negative, 1+ leukocytes.  Will send for culture.  He also reports ongoing bothersome ED despite trying Levitra and Viagra.  He is interested in other options.  We a long conversation about expected postop course after HOLEP, and the importance of Kegel exercises moving forward.  I also think he is probably drinking too much fluid which is contributing to his urgency and frequency.  HOLEP postop expectations reviewed at length Trial of tadalafil 10-20 mg on demand for ED Call with urine culture results RTC 3 months for symptom check  Billey Co, MD  [place] [address], [place], [place]  [address] [phone]

## 2019-04-06 NOTE — Patient Instructions (Addendum)
1. Drink when thirsty, but do not have to push high volume of fluids 2. Do kegel exercises daily  Kegel Exercises  Kegel exercises can help strengthen your pelvic floor muscles. The pelvic floor is a group of muscles that support your rectum, small intestine, and bladder. In females, pelvic floor muscles also help support the womb (uterus). These muscles help you control the flow of urine and stool. Kegel exercises are painless and simple, and they do not require any equipment. Your provider may suggest Kegel exercises to:  Improve bladder and bowel control.  Improve sexual response.  Improve weak pelvic floor muscles after surgery to remove the uterus (hysterectomy) or pregnancy (females).  Improve weak pelvic floor muscles after prostate gland removal or surgery (males). Kegel exercises involve squeezing your pelvic floor muscles, which are the same muscles you squeeze when you try to stop the flow of urine or keep from passing gas. The exercises can be done while sitting, standing, or lying down, but it is best to vary your position. Exercises How to do Kegel exercises: 1. Squeeze your pelvic floor muscles tight. You should feel a tight lift in your rectal area. If you are a male, you should also feel a tightness in your vaginal area. Keep your stomach, buttocks, and legs relaxed. 2. Hold the muscles tight for up to 10 seconds. 3. Breathe normally. 4. Relax your muscles. 5. Repeat as told by your health care provider. Repeat this exercise daily as told by your health care provider. Continue to do this exercise for at least 4-6 weeks, or for as long as told by your health care provider. You may be referred to a physical therapist who can help you learn more about how to do Kegel exercises. Depending on your condition, your health care provider may recommend:  Varying how long you squeeze your muscles.  Doing several sets of exercises every day.  Doing exercises for several weeks.   Making Kegel exercises a part of your regular exercise routine. This information is not intended to replace advice given to you by your health care provider. Make sure you discuss any questions you have with your health care provider. Document Revised: 08/12/2017 Document Reviewed: 08/12/2017 Elsevier Patient Education  [place].

## 2019-04-07 LAB — URINALYSIS, COMPLETE
Bilirubin, UA: NEGATIVE
Glucose, UA: NEGATIVE
Ketones, UA: NEGATIVE
Nitrite, UA: NEGATIVE
Specific Gravity, UA: 1.02 (ref 1.005–1.030)
Urobilinogen, Ur: 0.2 mg/dL (ref 0.2–1.0)
pH, UA: 7 (ref 5.0–7.5)

## 2019-04-07 LAB — MICROSCOPIC EXAMINATION

## 2019-04-11 LAB — CULTURE, URINE COMPREHENSIVE

## 2019-04-12 ENCOUNTER — Telehealth: Payer: Self-pay | Admitting: *Deleted

## 2019-04-12 MED ORDER — NITROFURANTOIN MONOHYD MACRO 100 MG PO CAPS: 100.0000 mg | ORAL_CAPSULE | Freq: Two times a day (BID) | ORAL | 0 refills | Status: DC

## 2019-04-12 NOTE — Telephone Encounter (Addendum)
Patient informed, sent in RX to pharmacy as requested. Voiced understanding.   ----- Message from Billey Co, MD sent at 04/11/2019  6:29 PM EDT ----- His urine culture grew out a small amount of bacteria, lets try 10 days of macrobid 100mg  BID, thanks  [name], MD 04/11/2019

## 2019-05-30 NOTE — Progress Notes (Signed)
Established patient visit   Patient: Micheal Mccoy   DOB: October 16, 1955   64 y.o. Male  MRN: [id number] Visit Date: 05/31/2019  Today's healthcare provider: Trinna Post, PA-C   Chief Complaint  Patient presents with  . Dizziness  . Loss of Consciousness  I,Jarrel Knoke M Slayde Brault,acting as a scribe for Trinna Post, PA-C.,have documented all relevant documentation on the behalf of Trinna Post, PA-C,as directed by  Trinna Post, PA-C while in the presence of Trinna Post, PA-C.  Subjective    Dizziness This is a new problem. The current episode started 1 to 4 weeks ago. The problem occurs daily. Associated symptoms include fatigue, a visual change and weakness. Pertinent negatives include no headaches, numbness or vertigo. The symptoms are aggravated by bending, standing, twisting and walking. He has tried lying down, sleep and rest for the symptoms. The treatment provided mild relief.  Loss of Consciousness This is a new problem. The current episode started 1 to 4 weeks ago. The problem occurs daily. The problem has been unchanged. There was no loss of consciousness. The symptoms are aggravated by sitting up and standing. Associated symptoms include dizziness, light-headedness, a visual change and weakness. Pertinent negatives include no confusion, headaches, palpitations, slurred speech or vertigo. He has tried bed rest for the symptoms. The treatment provided mild relief. His past medical history is significant for vertigo.  Patient reports he laid down 1:00 PM and did not wake up until 6:15 PM yesterday, 05/30/2019. Patient states that he had vertigo in the past.  Patient reports he has dizzy spells in any position. He will get up and feel woozy. No chest pain, no sob, no palpitations.     Medications: Outpatient Medications Prior to Visit  Medication Sig  . tadalafil (CIALIS) 10 MG tablet Take 1-2 tablets (10-20 mg total) by mouth daily as needed for erectile  dysfunction.  . nitrofurantoin, macrocrystal-monohydrate, (MACROBID) 100 MG capsule Take 1 capsule (100 mg total) by mouth 2 (two) times daily. (Patient not taking: Reported on 05/31/2019)   No facility-administered medications prior to visit.    Review of Systems  Constitutional: Positive for fatigue.  Cardiovascular: Positive for syncope. Negative for palpitations.  Neurological: Positive for dizziness, weakness and light-headedness. Negative for vertigo, numbness and headaches.  Psychiatric/Behavioral: Negative for confusion.      Objective    BP 120/80 (BP Location: Right Arm, Patient Position: Sitting, Cuff Size: Normal)   Pulse 60   Temp (!) 96.6 F (35.9 C) (Temporal)   Wt 196 lb 9.6 oz (89.2 kg)   SpO2 97%   BMI 24.57 kg/m    Physical Exam Constitutional:      Appearance: Normal appearance.  Eyes:     Extraocular Movements: Extraocular movements intact.     Pupils: Pupils are equal, round, and reactive to light.  Cardiovascular:     Rate and Rhythm: Normal rate and regular rhythm.     Pulses: Normal pulses.     Heart sounds: Normal heart sounds.  Pulmonary:     Effort: Pulmonary effort is normal.     Breath sounds: Normal breath sounds.  Skin:    General: Skin is warm and dry.  Neurological:     General: No focal deficit present.     Mental Status: He is alert and oriented to person, place, and time.     Cranial Nerves: No cranial nerve deficit.     Motor: No weakness.  Psychiatric:  Mood and Affect: Mood normal.        Behavior: Behavior normal.       No results found for any visits on 05/31/19.  Assessment & Plan    1. Dizziness  EKG normal except for slight bradycardia. Workup as below and refer to cardiology.   - EKG 12-Lead - Comprehensive Metabolic Panel (CMET) - TSH - CBC with Differential - Ambulatory referral to Cardiology  2. Vertigo  Trial of meclizine as below.   - meclizine (ANTIVERT) 25 MG tablet; Take 1 tablet (25 mg  total) by mouth 3 (three) times daily as needed for dizziness.  Dispense: 30 tablet; Refill: 0        I, Trinna Post, PA-C, have reviewed all documentation for this visit. The documentation on 05/31/19 for the exam, diagnosis, procedures, and orders are all accurate and complete.    Paulene Floor  [facility] [phone] (phone) [phone] (fax)  [place]

## 2019-05-31 ENCOUNTER — Encounter: Payer: Self-pay | Admitting: Physician Assistant

## 2019-05-31 ENCOUNTER — Ambulatory Visit (INDEPENDENT_AMBULATORY_CARE_PROVIDER_SITE_OTHER): Payer: BC Managed Care – PPO | Admitting: Physician Assistant

## 2019-05-31 ENCOUNTER — Other Ambulatory Visit: Payer: Self-pay

## 2019-05-31 VITALS — BP 120/80 | HR 60 | Temp 96.6°F | Wt 196.6 lb

## 2019-05-31 DIAGNOSIS — R42 Dizziness and giddiness: Secondary | ICD-10-CM | POA: Diagnosis not present

## 2019-05-31 MED ORDER — MECLIZINE HCL 25 MG PO TABS: 25.0000 mg | ORAL_TABLET | Freq: Three times a day (TID) | ORAL | 0 refills | Status: DC | PRN

## 2019-05-31 NOTE — Patient Instructions (Signed)

## 2019-06-01 LAB — CBC WITH DIFFERENTIAL/PLATELET
Basophils Absolute: 0 10*3/uL (ref 0.0–0.2)
Basos: 1 %
EOS (ABSOLUTE): 0.1 10*3/uL (ref 0.0–0.4)
Eos: 2 %
Hematocrit: 45.2 % (ref 37.5–51.0)
Hemoglobin: 15.2 g/dL (ref 13.0–17.7)
Immature Grans (Abs): 0 10*3/uL (ref 0.0–0.1)
Immature Granulocytes: 0 %
Lymphocytes Absolute: 1.5 10*3/uL (ref 0.7–3.1)
Lymphs: 41 %
MCH: 26.7 pg (ref 26.6–33.0)
MCHC: 33.6 g/dL (ref 31.5–35.7)
MCV: 79 fL (ref 79–97)
Monocytes Absolute: 0.5 10*3/uL (ref 0.1–0.9)
Monocytes: 13 %
Neutrophils Absolute: 1.6 10*3/uL (ref 1.4–7.0)
Neutrophils: 43 %
Platelets: 216 10*3/uL (ref 150–450)
RBC: 5.69 x10E6/uL (ref 4.14–5.80)
RDW: 13.3 % (ref 11.6–15.4)
WBC: 3.7 10*3/uL (ref 3.4–10.8)

## 2019-06-01 LAB — COMPREHENSIVE METABOLIC PANEL
ALT: 14 IU/L (ref 0–44)
AST: 18 IU/L (ref 0–40)
Albumin/Globulin Ratio: 1.7 (ref 1.2–2.2)
Albumin: 4.5 g/dL (ref 3.8–4.8)
Alkaline Phosphatase: 63 IU/L (ref 48–121)
BUN/Creatinine Ratio: 10 (ref 10–24)
BUN: 9 mg/dL (ref 8–27)
Bilirubin Total: 0.5 mg/dL (ref 0.0–1.2)
CO2: 24 mmol/L (ref 20–29)
Calcium: 9.3 mg/dL (ref 8.6–10.2)
Chloride: 103 mmol/L (ref 96–106)
Creatinine, Ser: 0.9 mg/dL (ref 0.76–1.27)
GFR calc Af Amer: 103 mL/min/{1.73_m2} (ref >59)
GFR calc non Af Amer: 89 mL/min/{1.73_m2} (ref >59)
Globulin, Total: 2.6 g/dL (ref 1.5–4.5)
Glucose: 93 mg/dL (ref 65–99)
Potassium: 4.2 mmol/L (ref 3.5–5.2)
Sodium: 140 mmol/L (ref 134–144)
Total Protein: 7.1 g/dL (ref 6.0–8.5)

## 2019-06-01 LAB — TSH: TSH: 1.39 u[IU]/mL (ref 0.450–4.500)

## 2019-06-09 ENCOUNTER — Encounter: Payer: Self-pay | Admitting: Urology

## 2019-06-09 ENCOUNTER — Ambulatory Visit (INDEPENDENT_AMBULATORY_CARE_PROVIDER_SITE_OTHER): Payer: BC Managed Care – PPO | Admitting: Cardiology

## 2019-06-09 ENCOUNTER — Other Ambulatory Visit: Payer: Self-pay

## 2019-06-09 ENCOUNTER — Encounter: Payer: Self-pay | Admitting: Cardiology

## 2019-06-09 ENCOUNTER — Ambulatory Visit (INDEPENDENT_AMBULATORY_CARE_PROVIDER_SITE_OTHER): Payer: BC Managed Care – PPO | Admitting: Urology

## 2019-06-09 VITALS — BP 145/86 | HR 61 | Ht 75.0 in | Wt 192.0 lb

## 2019-06-09 VITALS — BP 146/91 | HR 59 | Ht 75.0 in | Wt 192.0 lb

## 2019-06-09 DIAGNOSIS — H8110 Benign paroxysmal vertigo, unspecified ear: Secondary | ICD-10-CM | POA: Diagnosis not present

## 2019-06-09 DIAGNOSIS — N138 Other obstructive and reflux uropathy: Secondary | ICD-10-CM | POA: Diagnosis not present

## 2019-06-09 DIAGNOSIS — N401 Enlarged prostate with lower urinary tract symptoms: Secondary | ICD-10-CM

## 2019-06-09 DIAGNOSIS — N529 Male erectile dysfunction, unspecified: Secondary | ICD-10-CM | POA: Diagnosis not present

## 2019-06-09 DIAGNOSIS — R42 Dizziness and giddiness: Secondary | ICD-10-CM

## 2019-06-09 LAB — BLADDER SCAN AMB NON-IMAGING: Scan Result: 0

## 2019-06-09 MED ORDER — TADALAFIL 20 MG PO TABS: 20.0000 mg | ORAL_TABLET | Freq: Every day | ORAL | 3 refills | Status: DC | PRN

## 2019-06-09 NOTE — Patient Instructions (Signed)
Tadalafil tablets (Cialis) What is this medicine? TADALAFIL (tah DA la fil) is used to treat erection problems in men. It is also used for enlargement of the prostate gland in men, a condition called benign prostatic hyperplasia or BPH. This medicine improves urine flow and reduces BPH symptoms. This medicine can also treat both erection problems and BPH when they occur together. This medicine may be used for other purposes; ask your health care provider or pharmacist if you have questions. COMMON BRAND NAME(S): Adcirca, ALYQ, Cialis What should I tell my health care provider before I take this medicine? They need to know if you have any of these conditions:  bleeding disorders  eye or vision problems, including a rare inherited eye disease called retinitis pigmentosa  anatomical deformation of the penis, Peyronie's disease, or history of priapism (painful and prolonged erection)  heart disease, angina, a history of heart attack, irregular heart beats, or other heart problems  high or low blood pressure  history of blood diseases, like sickle cell anemia or leukemia  history of stomach bleeding  kidney disease  liver disease  stroke  an unusual or allergic reaction to tadalafil, other medicines, foods, dyes, or preservatives  pregnant or trying to get pregnant  breast-feeding How should I use this medicine? Take this medicine by mouth with a glass of water. Follow the directions on the prescription label. You may take this medicine with or without meals. When this medicine is used for erection problems, your doctor may prescribe it to be taken once daily or as needed. If you are taking the medicine as needed, you may be able to have sexual activity 30 minutes after taking it and for up to 36 hours after taking it. Whether you are taking the medicine as needed or once daily, you should not take more than one dose per day. If you are taking this medicine for symptoms of benign  prostatic hyperplasia (BPH) or to treat both BPH and an erection problem, take the dose once daily at about the same time each day. Do not take your medicine more often than directed. Talk to your pediatrician regarding the use of this medicine in children. Special care may be needed. Overdosage: If you think you have taken too much of this medicine contact a poison control center or emergency room at once. NOTE: This medicine is only for you. Do not share this medicine with others. What if I miss a dose? If you are taking this medicine as needed for erection problems, this does not apply. If you miss a dose while taking this medicine once daily for an erection problem, benign prostatic hyperplasia, or both, take it as soon as you remember, but do not take more than one dose per day. What may interact with this medicine? Do not take this medicine with any of the following medications:  nitrates like amyl nitrite, isosorbide dinitrate, isosorbide mononitrate, nitroglycerin  other medicines for erectile dysfunction like avanafil, sildenafil, vardenafil  other tadalafil products (Adcirca)  riociguat This medicine may also interact with the following medications:  certain drugs for high blood pressure  certain drugs for the treatment of HIV infection or AIDS  certain drugs used for fungal or yeast infections, like fluconazole, itraconazole, ketoconazole, and voriconazole  certain drugs used for seizures like carbamazepine, phenytoin, and phenobarbital  grapefruit juice  macrolide antibiotics like clarithromycin, erythromycin, troleandomycin  medicines for prostate problems  rifabutin, rifampin or rifapentine This list may not describe all possible interactions. Give your   health care provider a list of all the medicines, herbs, non-prescription drugs, or dietary supplements you use. Also tell them if you smoke, drink alcohol, or use illegal drugs. Some items may interact with your  medicine. What should I watch for while using this medicine? If you notice any changes in your vision while taking this drug, call your doctor or health care professional as soon as possible. Stop using this medicine and call your health care provider right away if you have a loss of sight in one or both eyes. Contact your doctor or health care professional right away if the erection lasts longer than 4 hours or if it becomes painful. This may be a sign of serious problem and must be treated right away to prevent permanent damage. If you experience symptoms of nausea, dizziness, chest pain or arm pain upon initiation of sexual activity after taking this medicine, you should refrain from further activity and call your doctor or health care professional as soon as possible. Do not drink alcohol to excess (examples, 5 glasses of wine or 5 shots of whiskey) when taking this medicine. When taken in excess, alcohol can increase your chances of getting a headache or getting dizzy, increasing your heart rate or lowering your blood pressure. Using this medicine does not protect you or your partner against HIV infection (the virus that causes AIDS) or other sexually transmitted diseases. What side effects may I notice from receiving this medicine? Side effects that you should report to your doctor or health care professional as soon as possible:  allergic reactions like skin rash, itching or hives, swelling of the face, lips, or tongue  breathing problems  changes in hearing  changes in vision  chest pain  fast, irregular heartbeat  prolonged or painful erection  seizures Side effects that usually do not require medical attention (report to your doctor or health care professional if they continue or are bothersome):  back pain  dizziness  flushing  headache  indigestion  muscle aches  nausea  stuffy or runny nose This list may not describe all possible side effects. Call your doctor  for medical advice about side effects. You may report side effects to FDA at 1-800-FDA-1088. Where should I keep my medicine? Keep out of the reach of children. Store at room temperature between 15 and 30 degrees C (59 and 86 degrees F). Throw away any unused medicine after the expiration date. NOTE: This sheet is a summary. It may not cover all possible information. If you have questions about this medicine, talk to your doctor, pharmacist, or health care provider.  2020 Elsevier/Gold Standard (2013-05-13 13:15:49)   Kegel Exercises  Kegel exercises can help strengthen your pelvic floor muscles. The pelvic floor is a group of muscles that support your rectum, small intestine, and bladder. In females, pelvic floor muscles also help support the womb (uterus). These muscles help you control the flow of urine and stool. Kegel exercises are painless and simple, and they do not require any equipment. Your provider may suggest Kegel exercises to:  Improve bladder and bowel control.  Improve sexual response.  Improve weak pelvic floor muscles after surgery to remove the uterus (hysterectomy) or pregnancy (females).  Improve weak pelvic floor muscles after prostate gland removal or surgery (males). Kegel exercises involve squeezing your pelvic floor muscles, which are the same muscles you squeeze when you try to stop the flow of urine or keep from passing gas. The exercises can be done while sitting, standing,   or lying down, but it is best to vary your position. Exercises How to do Kegel exercises: 1. Squeeze your pelvic floor muscles tight. You should feel a tight lift in your rectal area. If you are a male, you should also feel a tightness in your vaginal area. Keep your stomach, buttocks, and legs relaxed. 2. Hold the muscles tight for up to 10 seconds. 3. Breathe normally. 4. Relax your muscles. 5. Repeat as told by your health care provider. Repeat this exercise daily as told by your  health care provider. Continue to do this exercise for at least 4-6 weeks, or for as long as told by your health care provider. You may be referred to a physical therapist who can help you learn more about how to do Kegel exercises. Depending on your condition, your health care provider may recommend:  Varying how long you squeeze your muscles.  Doing several sets of exercises every day.  Doing exercises for several weeks.  Making Kegel exercises a part of your regular exercise routine. This information is not intended to replace advice given to you by your health care provider. Make sure you discuss any questions you have with your health care provider. Document Revised: 08/12/2017 Document Reviewed: 08/12/2017 Elsevier Patient Education  2020 Elsevier Inc.  

## 2019-06-09 NOTE — Progress Notes (Signed)
Cardiology Office Note:    Date:  06/09/2019   ID:  Micheal Mccoy, DOB 1955-04-26, MRN [id number]  PCP:  [name], MD  Cardiologist:  Kate Sable, MD  Electrophysiologist:  None   Referring MD: Trinna Post, PA-C   Chief Complaint  Patient presents with  . New patient    Referred by PCP for Dizziness and Fatigue. Meds reviewed verbally with patient.    Micheal Mccoy is a 64 y.o. male who is being seen today for the evaluation of dizziness at the request of Terrilee Croak, Adriana M, [place].   History of Present Illness:    Micheal Mccoy is a 64 y.o. male with a hx of GERD, arthritis who presents due to dizziness.  Patient states having symptoms of dizziness for the past 2 weeks.  Symptoms usually occur when he rises up from a seated position, bends down to tie his shoelace and stands back up, moving from side to side or abrupt head movements when watching TV.  Symptoms usually improve with laying still.  He denies any history of heart disease, denies chest pain, shortness of breath, palpitations with or without his symptoms of dizziness.  He saw his primary care provider and Antivert was started.  He does not think the Antivert has helped that much.  He otherwise feels well.  Past Medical History:  Diagnosis Date  . Arthritis   . Benign neoplasm of ascending colon   . Degenerative disc disease, cervical   . Esophagitis, reflux 09/29/2005  . Frequency of urination   . GERD (gastroesophageal reflux disease)   . Peyronie's disease    W/ PENILE CURVATURE    Past Surgical History:  Procedure Laterality Date  . ANTERIOR CERVICAL DECOMP/DISCECTOMY FUSION N/A 01/25/2018   Procedure: ANTERIOR CERVICAL DECOMPRESSION/DISCECTOMY FUSION 1 LEVEL C7-T1, T1-T2;  Surgeon: Deetta Perla, MD;  Location: [facility];  Service: Neurosurgery;  Laterality: N/A;  . ANTERIOR CERVICAL DISCECTOMY  01/25/2018   Dr. Lacinda Axon, ACDF ARTHRODESIS, OSTEOPHYTECTOMY &DECOMPRESSION OF SPINAL CORD AND/ORNERVE ROOTS;  BELOW C2--C7-T1  . COLONOSCOPY WITH PROPOFOL N/A 09/14/2014   Procedure: COLONOSCOPY WITH PROPOFOL;  Surgeon: Lucilla Lame, MD;  Location: [place];  Service: Endoscopy;  Laterality: N/A;  . HERNIA REPAIR Bilateral 04/09/2015   Bard 3D Max mesh for bilateral inguinal hernias, primary repair of umbilical henria.   Marland Kitchen HOLEP-LASER ENUCLEATION OF THE PROSTATE WITH MORCELLATION N/A 02/17/2019   Procedure: HOLEP-LASER ENUCLEATION OF THE PROSTATE WITH MORCELLATION;  Surgeon: Billey Co, MD;  Location: [facility];  Service: Urology;  Laterality: N/A;  . INGUINAL HERNIA REPAIR Bilateral 04/09/2015   Procedure: LAPAROSCOPIC INGUINAL HERNIA;  Surgeon: Robert Bellow, MD;  Location: [facility];  Service: General;  Laterality: Bilateral;  . NESBIT PROCEDURE N/A 06/28/2012   Procedure: 16 DOT PLACTATION PROCEDURE;  Surgeon: Claybon Jabs, MD;  Location: [facility];  Service: Urology;  Laterality: N/A;  . Peyronies Disease  06/2012  . POLYPECTOMY  09/14/2014   Procedure: POLYPECTOMY;  Surgeon: Lucilla Lame, MD;  Location: [place];  Service: Endoscopy;;  . PROSTATE BIOPSY  03-02-15   neg  . THORACIC DISCECTOMY  01/25/2018   Dr. Lacinda Axon, ARTHRODESIS ANTERIOR INTERBODY W/DISCECTOMY ADDITIONAL LEVEL T1-T2  . UMBILICAL HERNIA REPAIR N/A 04/09/2015   Procedure: HERNIA REPAIR UMBILICAL ADULT;  Surgeon: Robert Bellow, MD;  Location: [facility];  Service: General;  Laterality: N/A;    Current Medications: Current Meds  Medication Sig  . meclizine (ANTIVERT) 25 MG  tablet Take 1 tablet (25 mg total) by mouth 3 (three) times daily as needed for dizziness.  . [DISCONTINUED] tadalafil (CIALIS) 10 MG tablet Take 1-2 tablets (10-20 mg total) by mouth daily as needed for erectile dysfunction.     Allergies:   Aleve [naproxen]   Social History   Socioeconomic History  . Marital status: Married    Spouse name: Not on file  . Number of children: 3  . Years of education: Not on file   . Highest education level: Not on file  Occupational History  . Occupation: Associate Professor    Employer: [employer]  Tobacco Use  . Smoking status: Never Smoker  . Smokeless tobacco: Never Used  Substance and Sexual Activity  . Alcohol use: No  . Drug use: No  . Sexual activity: Yes  Other Topics Concern  . Not on file  Social History Narrative  . Not on file   Social Determinants of Health   Financial Resource Strain:   . Difficulty of Paying Living Expenses:   Food Insecurity:   . Worried About Charity fundraiser in the Last Year:   . Arboriculturist in the Last Year:   Transportation Needs:   . Film/video editor (Medical):   Marland Kitchen Lack of Transportation (Non-Medical):   Physical Activity:   . Days of Exercise per Week:   . Minutes of Exercise per Session:   Stress:   . Feeling of Stress :   Social Connections:   . Frequency of Communication with Friends and Family:   . Frequency of Social Gatherings with Friends and Family:   . Attends Religious Services:   . Active Member of Clubs or Organizations:   . Attends Archivist Meetings:   Marland Kitchen Marital Status:      Family History: The patient's family history is negative for Prostate cancer, Bladder Cancer, and Kidney cancer.  ROS:   Please see the history of present illness.     All other systems reviewed and are negative.  EKGs/Labs/Other Studies Reviewed:    The following studies were reviewed today:   EKG:  EKG is  ordered today.  The ekg ordered today demonstrates sinus bradycardia, otherwise normal ECG.  Recent Labs: 05/31/2019: ALT 14; BUN 9; Creatinine, Ser 0.90; Hemoglobin 15.2; Platelets 216; Potassium 4.2; Sodium 140; TSH 1.390  Recent Lipid Panel    Component Value Date/Time   CHOL 177 11/16/2018 1014   TRIG 42 11/16/2018 1014   HDL 55 11/16/2018 1014   CHOLHDL 3.2 11/16/2018 1014   LDLCALC 113 (H) 11/16/2018 1014    Physical Exam:    VS:  BP (!) 146/91 (BP Location: Right  Arm, Patient Position: Sitting, Cuff Size: Normal)   Pulse (!) 59   Ht 6\' 3"  (1.905 m)   Wt 192 lb (87.1 kg)   SpO2 97%   BMI 24.00 kg/m     Wt Readings from Last 3 Encounters:  06/09/19 192 lb (87.1 kg)  06/09/19 192 lb (87.1 kg)  05/31/19 196 lb 9.6 oz (89.2 kg)     GEN:  Well nourished, well developed in no acute distress HEENT: Normal NECK: No JVD; No carotid bruits LYMPHATICS: No lymphadenopathy CARDIAC: RRR, no murmurs, rubs, gallops RESPIRATORY:  Clear to auscultation without rales, wheezing or rhonchi  ABDOMEN: Soft, non-tender, non-distended MUSCULOSKELETAL:  No edema; No deformity  SKIN: Warm and dry NEUROLOGIC:  Alert and oriented x 3 PSYCHIATRIC:  Normal affect   ASSESSMENT:  1. Dizziness   2. BPPV (benign paroxysmal positional vertigo), unspecified laterality    PLAN:    In order of problems listed above:  Patient with history of dizziness associated with position.  Orthostatic vitals in the office today did not reveal any evidence for orthostasis.  His symptoms are consistent with positional vertigo.  He has no cardiac symptoms, is low risk for cardiac disease.  His symptoms are not consistent with a cardiac etiology for dizziness.  Recommend continue trial of Antivert, or switch to another medicine.  If symptoms persist ENT input could be sought as per primary care physician.  Follow-up as needed.  This note was generated in part or whole with voice recognition software. Voice recognition is usually quite accurate but there are transcription errors that can and very often do occur. I apologize for any typographical errors that were not detected and corrected.  Medication Adjustments/Labs and Tests Ordered: Current medicines are reviewed at length with the patient today.  Concerns regarding medicines are outlined above.  Orders Placed This Encounter  Procedures  . EKG 12-Lead   No orders of the defined types were placed in this encounter.   Patient  Instructions  Medication Instructions:   Your physician recommends that you continue on your current medications as directed. Please refer to the Current Medication list given to you today. *If you need a refill on your cardiac medications before your next appointment, please call your pharmacy*   Lab Work: None Ordered If you have labs (blood work) drawn today and your tests are completely normal, you will receive your results only by: Marland Kitchen MyChart Message (if you have MyChart) OR . A paper copy in the mail If you have any lab test that is abnormal or we need to change your treatment, we will call you to review the results.   Testing/Procedures: None Ordered   Follow-Up: At [facility], you and your health needs are our priority.  As part of our continuing mission to provide you with exceptional heart care, we have created designated Provider Care Teams.  These Care Teams include your primary Cardiologist (physician) and Advanced Practice Providers (APPs -  Physician Assistants and Nurse Practitioners) who all work together to provide you with the care you need, when you need it.  We recommend signing up for the patient portal called "MyChart".  Sign up information is provided on this After Visit Summary.  MyChart is used to connect with patients for Virtual Visits (Telemedicine).  Patients are able to view lab/test results, encounter notes, upcoming appointments, etc.  Non-urgent messages can be sent to your provider as well.   To learn more about what you can do with MyChart, go to [email or web address].    Your next appointment:    Follow up as needed   The format for your next appointment:   In Person  Provider:   Kate Sable, MD   Other Instructions  N/A     Signed, Kate Sable, MD  06/09/2019 12:18 PM    [place]

## 2019-06-09 NOTE — Patient Instructions (Signed)
Medication Instructions:  Your physician recommends that you continue on your current medications as directed. Please refer to the Current Medication list given to you today.  *If you need a refill on your cardiac medications before your next appointment, please call your pharmacy*   Lab Work: None Ordered If you have labs (blood work) drawn today and your tests are completely normal, you will receive your results only by: . MyChart Message (if you have MyChart) OR . A paper copy in the mail If you have any lab test that is abnormal or we need to change your treatment, we will call you to review the results.   Testing/Procedures: None Ordered   Follow-Up: At [facility], you and your health needs are our priority.  As part of our continuing mission to provide you with exceptional heart care, we have created designated Provider Care Teams.  These Care Teams include your primary Cardiologist (physician) and Advanced Practice Providers (APPs -  Physician Assistants and Nurse Practitioners) who all work together to provide you with the care you need, when you need it.  We recommend signing up for the patient portal called "MyChart".  Sign up information is provided on this After Visit Summary.  MyChart is used to connect with patients for Virtual Visits (Telemedicine).  Patients are able to view lab/test results, encounter notes, upcoming appointments, etc.  Non-urgent messages can be sent to your provider as well.   To learn more about what you can do with MyChart, go to [email or web address].    Your next appointment:   Follow up as needed   The format for your next appointment:   In Person  Provider:   Brian Agbor-Etang, MD   Other Instructions N/A  

## 2019-06-09 NOTE — Progress Notes (Signed)
   06/09/2019 12:10 PM   GLYN BARRAZA 1955/09/29 [id number]  Reason for visit: Follow up HoLEP, ED  HPI: I saw Mr. Burandt in urology clinic today for follow-up after undergoing HOLEP on 02/17/2019 for BPH symptoms of weak stream, straining, feeling of incomplete emptying, and nocturia.  87 g were removed showing only benign prostatic tissue. He had some persistent urgency and frequency post-op, but that has since resolved and he is doing very well with his urination. IPSS score 7 today, quality of life mostly satisfied, PVR 41ml. He has some mild stress incontinence when doing ab workouts. Denies hematuria or dysuria. Still having ED despite cialis 10mg  on demand.  He recently retired and is looking into getting an airstream trailer.  Encouraged Kegel exercises  Trial of cialis 20mg  PRN or every other day for ED, risks and benefits discussed at length RTC 6 months PVR, symptom check  Billey Co, MD  [facility] [address] [place], [place] [address] [phone]

## 2019-06-10 ENCOUNTER — Ambulatory Visit: Payer: BC Managed Care – PPO | Admitting: Family Medicine

## 2019-07-12 ENCOUNTER — Ambulatory Visit (INDEPENDENT_AMBULATORY_CARE_PROVIDER_SITE_OTHER): Payer: BC Managed Care – PPO | Admitting: Physician Assistant

## 2019-07-12 ENCOUNTER — Other Ambulatory Visit: Payer: Self-pay

## 2019-07-12 VITALS — BP 134/88 | HR 62 | Ht 75.0 in | Wt 194.0 lb

## 2019-07-12 DIAGNOSIS — M7989 Other specified soft tissue disorders: Secondary | ICD-10-CM

## 2019-07-12 NOTE — Progress Notes (Signed)
     Established patient visit   Patient: Micheal Mccoy   DOB: 06/17/1955   64 y.o. Male  MRN: [id number] Visit Date: 07/12/2019  Today's healthcare provider: Trinna Post, PA-C   Chief Complaint  Patient presents with  . Leg Swelling   Subjective    HPI  Patient presents today with swelling in both feet. He has had symptoms for about 2 weeks. Patient describes it as a pressure sensation. He denies any pain. He reports that the swelling is worse throughout the day while doing daily activities. He reports that it is better at night when he has his legs elevated. He has not had symptoms before. He has not tried anything to help symptoms. Denies chest pain, shortness of breath, or numbness or tingling in his extremities.  CT angio 01/26/2018 did not show cardiomegaly or other fluid overload.       Medications: Outpatient Medications Prior to Visit  Medication Sig  . meclizine (ANTIVERT) 25 MG tablet Take 1 tablet (25 mg total) by mouth 3 (three) times daily as needed for dizziness.  . tadalafil (CIALIS) 20 MG tablet Take 1 tablet (20 mg total) by mouth daily as needed for erectile dysfunction. Or take every other day for ED   No facility-administered medications prior to visit.    Review of Systems  Constitutional: Negative.   Respiratory: Negative for cough.   Cardiovascular: Negative for chest pain, palpitations and leg swelling.  Musculoskeletal: Positive for myalgias. Negative for arthralgias, back pain, gait problem, joint swelling, neck pain and neck stiffness.  Neurological: Negative for dizziness, weakness and numbness.      Objective    BP 134/88   Pulse 62   Ht 6\' 3"  (1.905 m)   Wt 194 lb (88 kg)   BMI 24.25 kg/m    Physical Exam Constitutional:      Appearance: Normal appearance.  Cardiovascular:     Rate and Rhythm: Normal rate and regular rhythm.     Pulses: Normal pulses.     Heart sounds: Normal heart sounds.  Pulmonary:     Effort: Pulmonary  effort is normal.     Breath sounds: Normal breath sounds.  Feet:     Right foot:     Skin integrity: Skin integrity normal.     Left foot:     Skin integrity: Skin integrity normal.     Comments: Mild edema. No pitting.  Skin:    General: Skin is warm.  Neurological:     Mental Status: He is alert and oriented to person, place, and time. Mental status is at baseline.  Psychiatric:        Mood and Affect: Mood normal.        Behavior: Behavior normal.     No results found for any visits on 07/12/19.  Assessment & Plan    1. Foot swelling Symptoms consistent with dependent edema. Low suspicion for heart failure. Recommend compression stockings and elevation to decrease swelling. F/U if not improving.   Return if symptoms worsen or fail to improve.      ITrinna Post, PA-C, have reviewed all documentation for this visit. The documentation on 07/13/19 for the exam, diagnosis, procedures, and orders are all accurate and complete.    Paulene Floor  [facility] [phone] (phone) [phone] (fax)  [place]

## 2019-07-13 NOTE — Patient Instructions (Signed)
Edema  Edema is when you have too much fluid in your body or under your skin. Edema may make your legs, feet, and ankles swell up. Swelling is also common in looser tissues, like around your eyes. This is a common condition. It gets more common as you get older. There are many possible causes of edema. Eating too much salt (sodium) and being on your feet or sitting for a long time can cause edema in your legs, feet, and ankles. Hot weather may make edema worse. Edema is usually painless. Your skin may look swollen or shiny. Follow these instructions at home:  Keep the swollen body part raised (elevated) above the level of your heart when you are sitting or lying down.  Do not sit still or stand for a long time.  Do not wear tight clothes. Do not wear garters on your upper legs.  Exercise your legs. This can help the swelling go down.  Wear elastic bandages or support stockings as told by your doctor.  Eat a low-salt (low-sodium) diet to reduce fluid as told by your doctor.  Depending on the cause of your swelling, you may need to limit how much fluid you drink (fluid restriction).  Take over-the-counter and prescription medicines only as told by your doctor. Contact a doctor if:  Treatment is not working.  You have heart, liver, or kidney disease and have symptoms of edema.  You have sudden and unexplained weight gain. Get help right away if:  You have shortness of breath or chest pain.  You cannot breathe when you lie down.  You have pain, redness, or warmth in the swollen areas.  You have heart, liver, or kidney disease and get edema all of a sudden.  You have a fever and your symptoms get worse all of a sudden. Summary  Edema is when you have too much fluid in your body or under your skin.  Edema may make your legs, feet, and ankles swell up. Swelling is also common in looser tissues, like around your eyes.  Raise (elevate) the swollen body part above the level of your  heart when you are sitting or lying down.  Follow your doctor's instructions about diet and how much fluid you can drink (fluid restriction). This information is not intended to replace advice given to you by your health care provider. Make sure you discuss any questions you have with your health care provider. Document Revised: 12/26/2016 Document Reviewed: 01/11/2016 Elsevier Patient Education  2020 Elsevier Inc.  

## 2019-07-19 ENCOUNTER — Telehealth: Payer: Self-pay

## 2019-07-19 NOTE — Telephone Encounter (Signed)
Copied from [place] [id number]. 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 The pt states that he is needing it done within 3-5 days. PCP next appt is not until 07/26/19. Please advise.

## 2019-10-04 ENCOUNTER — Ambulatory Visit: Payer: BC Managed Care – PPO | Attending: Internal Medicine

## 2019-10-04 DIAGNOSIS — Z23 Encounter for immunization: Secondary | ICD-10-CM

## 2019-10-04 NOTE — Progress Notes (Signed)
   Covid-19 Vaccination Clinic  Name:  Micheal Mccoy    MRN: [id number] DOB: 09-15-1955  10/04/2019  Mr. [name] was observed post Covid-19 immunization for 15 minutes without incident. He was provided with Vaccine Information Sheet and instruction to access the V-Safe system.   Mr. [name] was instructed to call 911 with any severe reactions post vaccine: Marland Kitchen Difficulty breathing  . Swelling of face and throat  . A fast heartbeat  . A bad rash all over body  . Dizziness and weakness      Covid-19 Vaccination Clinic  Name:  Micheal Mccoy    MRN: [id number] DOB: 1955-09-02  10/04/2019  Mr. [name] was observed post Covid-19 immunization for 15 minutes without incident. He was provided with Vaccine Information Sheet and instruction to access the V-Safe system.   Mr. [name] was instructed to call 911 with any severe reactions post vaccine: Marland Kitchen Difficulty breathing  . Swelling of face and throat  . A fast heartbeat  . A bad rash all over body  . Dizziness and weakness

## 2019-12-12 ENCOUNTER — Encounter: Payer: Self-pay | Admitting: Urology

## 2019-12-12 ENCOUNTER — Ambulatory Visit (INDEPENDENT_AMBULATORY_CARE_PROVIDER_SITE_OTHER): Payer: Medicare Other | Admitting: Urology

## 2019-12-12 ENCOUNTER — Other Ambulatory Visit: Payer: Self-pay

## 2019-12-12 VITALS — BP 122/69 | HR 65 | Ht 75.0 in | Wt 185.0 lb

## 2019-12-12 DIAGNOSIS — N529 Male erectile dysfunction, unspecified: Secondary | ICD-10-CM | POA: Diagnosis not present

## 2019-12-12 DIAGNOSIS — N138 Other obstructive and reflux uropathy: Secondary | ICD-10-CM | POA: Diagnosis not present

## 2019-12-12 DIAGNOSIS — N401 Enlarged prostate with lower urinary tract symptoms: Secondary | ICD-10-CM | POA: Diagnosis not present

## 2019-12-12 DIAGNOSIS — R972 Elevated prostate specific antigen [PSA]: Secondary | ICD-10-CM | POA: Diagnosis not present

## 2019-12-12 LAB — BLADDER SCAN AMB NON-IMAGING

## 2019-12-12 MED ORDER — TADALAFIL 20 MG PO TABS: 20.0000 mg | ORAL_TABLET | Freq: Every day | ORAL | 3 refills | Status: DC

## 2019-12-12 NOTE — Progress Notes (Signed)
   12/12/2019 4:14 PM   [name] [birth year] [id number]  Reason for visit: Follow up BPH status post HOLEP, elevated PSA, ED/history of peyronies disease  HPI: I saw Mr. Coltrane in follow-up for the above issues.  He is a healthy 64 year old male who underwent a HOLEP in February 2021 for BPH symptoms of weak stream, straining, feeling of incomplete emptying, and nocturia.  87 g of tissue were removed showing only benign prostatic tissue.  He is doing very well with his urination since surgery and IPSS score today is 5.  PVR is 0 mL.  He has very mild leakage when he lifts heavy weights.  His PSA was elevated as high as 7 preop, and prostate MRI was negative prior to HOLEP.  He has a history of peyronies disease and underwent a 16 dot plication in 4098 with Dr. Karsten Ro.  He does not have any persistent curvature, but has had persistent trouble with erectile dysfunction, that now is affecting his marriage.  He has tried Licensed conveyancer and Viagra previously without significant improvement, and most recently has been on 20 mg Cialis every other day.  He notices some improvement on the Cialis, but not enough for satisfactory intercourse.  I recommended taking the Cialis daily, and checking a testosterone today.  Call with testosterone results PSA today to confirm decrease after HOLEP, call with results Cialis 20 mg daily  Billey Co, MD  [facility] [address], [place], [place] [address] [phone]

## 2019-12-13 LAB — TESTOSTERONE: Testosterone: 878 ng/dL (ref 264–916)

## 2019-12-13 LAB — PSA TOTAL (REFLEX TO FREE): Prostate Specific Ag, Serum: 1.6 ng/mL (ref 0.0–4.0)

## 2019-12-14 ENCOUNTER — Telehealth: Payer: Self-pay

## 2019-12-14 NOTE — Telephone Encounter (Signed)
-----   Message from Billey Co, MD sent at 12/14/2019  8:17 AM EST ----- Good news, PSA back down to normal at 1.6, and testosterone is normal at 878.  Please offer him follow-up in 1 to 2 months with Larene Beach to discuss the ED further  [name], MD 12/14/2019

## 2019-12-14 NOTE — Telephone Encounter (Signed)
Called pt no answer. Left detailed message for pt informing him of the information below. Advised pt to call back for questions or concerns.  Pt called back, and was informed and scheduled.

## 2019-12-22 NOTE — Progress Notes (Signed)
Subjective:   Micheal Mccoy is a 64 y.o. male who presents for an Initial Medicare Annual Wellness Visit.  I connected with Micheal Mccoy today by telephone and verified that I am speaking with the correct person using two identifiers. Location patient: home Location provider: work Persons participating in the virtual visit: patient, provider.   I discussed the limitations, risks, security and privacy concerns of performing an evaluation and management service by telephone and the availability of in person appointments. I also discussed with the patient that there may be a patient responsible charge related to this service. The patient expressed understanding and verbally consented to this telephonic visit.    Interactive audio and video telecommunications were attempted between this provider and patient, however failed, due to patient having technical difficulties OR patient did not have access to video capability.  We continued and completed visit with audio only.   Review of Systems    N/A  Cardiac Risk Factors include: advanced age (>54men, >27 women);male gender     Objective:    There were no vitals filed for this visit. There is no height or weight on file to calculate BMI.  Advanced Directives 12/26/2019 03/07/2019 02/17/2019 01/11/2019 01/25/2018 01/25/2018 01/14/2018  Does Patient Have a Medical Advance Directive? Yes Yes Yes Yes - Yes Yes  Type of Advance Directive Living will Living will;Healthcare Power of [place];Living will [place];Living will - [place];Living will [place];Living will  Does patient want to make changes to medical advance directive? - No - Patient declined No - Patient declined No - Patient declined No - Patient declined - No - Patient declined  Copy of [place] in Chart? - No - copy requested - - - No - copy requested No - copy requested  Would patient  like information on creating a medical advance directive? - No - Patient declined - - - - -  Pre-existing out of facility DNR order (yellow form or pink MOST form) - - - - - - -    Current Medications (verified) Outpatient Encounter Medications as of 12/26/2019  Medication Sig  . tadalafil (CIALIS) 20 MG tablet Take 1 tablet (20 mg total) by mouth daily. Or take every other day for ED   No facility-administered encounter medications on file as of 12/26/2019.    Allergies (verified) Aleve [naproxen]   History: Past Medical History:  Diagnosis Date  . Arthritis   . Benign neoplasm of ascending colon   . Degenerative disc disease, cervical   . Esophagitis, reflux 09/29/2005  . Frequency of urination   . GERD (gastroesophageal reflux disease)   . Peyronie's disease    W/ PENILE CURVATURE   Past Surgical History:  Procedure Laterality Date  . ANTERIOR CERVICAL DECOMP/DISCECTOMY FUSION N/A 01/25/2018   Procedure: ANTERIOR CERVICAL DECOMPRESSION/DISCECTOMY FUSION 1 LEVEL C7-T1, T1-T2;  Surgeon: Deetta Perla, MD;  Location: [facility];  Service: Neurosurgery;  Laterality: N/A;  . ANTERIOR CERVICAL DISCECTOMY  01/25/2018   Dr. Lacinda Axon, ACDF ARTHRODESIS, OSTEOPHYTECTOMY &DECOMPRESSION OF SPINAL CORD AND/ORNERVE ROOTS; BELOW C2--C7-T1  . COLONOSCOPY WITH PROPOFOL N/A 09/14/2014   Procedure: COLONOSCOPY WITH PROPOFOL;  Surgeon: Lucilla Lame, MD;  Location: [facility];  Service: Endoscopy;  Laterality: N/A;  . HERNIA REPAIR Bilateral 04/09/2015   Bard 3D Max mesh for bilateral inguinal hernias, primary repair of umbilical henria.   Marland Kitchen HOLEP-LASER ENUCLEATION OF THE PROSTATE WITH MORCELLATION N/A 02/17/2019  Procedure: HOLEP-LASER ENUCLEATION OF THE PROSTATE WITH MORCELLATION;  Surgeon: Billey Co, MD;  Location: [facility];  Service: Urology;  Laterality: N/A;  . INGUINAL HERNIA REPAIR Bilateral 04/09/2015   Procedure: LAPAROSCOPIC INGUINAL HERNIA;  Surgeon: Robert Bellow, MD;   Location: [facility];  Service: General;  Laterality: Bilateral;  . NESBIT PROCEDURE N/A 06/28/2012   Procedure: 16 DOT PLACTATION PROCEDURE;  Surgeon: Claybon Jabs, MD;  Location: [facility];  Service: Urology;  Laterality: N/A;  . Peyronies Disease  06/2012  . POLYPECTOMY  09/14/2014   Procedure: POLYPECTOMY;  Surgeon: Lucilla Lame, MD;  Location: [facility];  Service: Endoscopy;;  . PROSTATE BIOPSY  03-02-15   neg  . THORACIC DISCECTOMY  01/25/2018   Dr. Lacinda Axon, ARTHRODESIS ANTERIOR INTERBODY W/DISCECTOMY ADDITIONAL LEVEL T1-T2  . UMBILICAL HERNIA REPAIR N/A 04/09/2015   Procedure: HERNIA REPAIR UMBILICAL ADULT;  Surgeon: Robert Bellow, MD;  Location: [facility];  Service: General;  Laterality: N/A;   Family History  Problem Relation Age of Onset  . Prostate cancer Neg Hx   . Bladder Cancer Neg Hx   . Kidney cancer Neg Hx    Social History   Socioeconomic History  . Marital status: Married    Spouse name: Not on file  . Number of children: 3  . Years of education: PHD  . Highest education level: Doctorate  Occupational History  . Occupation: Associate Professor    Employer: [employer]	[place]    Comment: retired  Tobacco Use  . Smoking status: Never Smoker  . Smokeless tobacco: Never Used  Vaping Use  . Vaping Use: Never used  Substance and Sexual Activity  . Alcohol use: No  . Drug use: No  . Sexual activity: Yes  Other Topics Concern  . Not on file  Social History Narrative  . Not on file   Social Determinants of Health   Financial Resource Strain: Low Risk   . Difficulty of Paying Living Expenses: Not hard at all  Food Insecurity: No Food Insecurity  . Worried About Charity fundraiser in the Last Year: Never true  . Ran Out of Food in the Last Year: Never true  Transportation Needs: No Transportation Needs  . Lack of Transportation (Medical): No  . Lack of Transportation (Non-Medical): No  Physical Activity: Sufficiently Active  . Days of  Exercise per Week: 4 days  . Minutes of Exercise per Session: 90 min  Stress: No Stress Concern Present  . Feeling of Stress : Not at all  Social Connections: Moderately Integrated  . Frequency of Communication with Friends and Family: More than three times a week  . Frequency of Social Gatherings with Friends and Family: More than three times a week  . Attends Religious Services: Never  . Active Member of Clubs or Organizations: Yes  . Attends Archivist Meetings: More than 4 times per year  . Marital Status: Married    Tobacco Counseling Counseling given: Not Answered   Clinical Intake:  Pre-visit preparation completed: Yes  Pain : No/denies pain     Nutritional Risks: None Diabetes: No  How often do you need to have someone help you when you read instructions, pamphlets, or other written materials from your doctor or pharmacy?: 1 - Never  Diabetic? No  Interpreter Needed?: No  Information entered by :: Mmarkoski, LPN   Activities of Daily Living In your present state of health, do you have any difficulty performing the following  activities: 12/26/2019 01/11/2019  Hearing? N N  Comment Wears bilateral hearing aid. -  Vision? N N  Comment Wears eye glasses. -  Difficulty concentrating or making decisions? N N  Walking or climbing stairs? N N  Dressing or bathing? N N  Doing errands, shopping? N N  Preparing Food and eating ? N -  Using the Toilet? N -  In the past six months, have you accidently leaked urine? N -  Do you have problems with loss of bowel control? N -  Managing your Medications? N -  Managing your Finances? N -  Housekeeping or managing your Housekeeping? N -  Some recent data might be hidden    Patient Care Team: [name], MD as PCP - General (Family Medicine) Kate Sable, MD as PCP - Cardiology (Cardiology) Bary Castilla Forest Gleason, MD as Consulting Physician (General Surgery) Deetta Perla, MD as Consulting Physician  (Neurosurgery) Billey Co, MD as Consulting Physician (Urology) System, Provider Not In (Optometry) Throat, [place] (Audiology)  Indicate any recent Medical Services you may have received from other than [facility] providers in the past year (date may be approximate).     Assessment:   This is a routine wellness examination for Wendy.  Hearing/Vision screen No exam data present  Dietary issues and exercise activities discussed: Current Exercise Habits: Structured exercise class, Type of exercise: strength training/weights;stretching;treadmill;walking, Time (Minutes): > 60, Frequency (Times/Week): 4, Weekly Exercise (Minutes/Week): 0, Intensity: Mild, Exercise limited by: None identified  Goals    . DIET - EAT MORE FRUITS AND VEGETABLES     Recommend to eat more salads and more green leafy vegetables in daily diet.       Depression Screen PHQ 2/9 Scores 12/26/2019 11/16/2018 07/28/2017 12/28/2015 08/25/2014  PHQ - 2 Score 0 3 3 0 0  PHQ- 9 Score - 5 5 0 0    Fall Risk Fall Risk  12/26/2019 11/16/2018 12/28/2015  Falls in the past year? 0 0 No  Number falls in past yr: 0 0 -  Injury with Fall? 0 0 -  Follow up - Falls evaluation completed -    FALL RISK PREVENTION PERTAINING TO THE HOME:  Any stairs in or around the home? No  If so, are there any without handrails? No  Home free of loose throw rugs in walkways, pet beds, electrical cords, etc? Yes  Adequate lighting in your home to reduce risk of falls? Yes   ASSISTIVE DEVICES UTILIZED TO PREVENT FALLS:  Life alert? No  Use of a cane, walker or w/c? No  Grab bars in the bathroom? No  Shower chair or bench in shower? No  Elevated toilet seat or a handicapped toilet? Yes    Cognitive Function:      6CIT Screen 12/26/2019  What Year? 0 points  What month? 0 points  What time? 0 points  Count back from 20 0 points  Months in reverse 0 points  Repeat phrase 0 points  Total Score 0     Immunizations Immunization History  Administered Date(s) Administered  . Fluad Quad(high Dose 65+) 11/16/2018  . Influenza,inj,Quad PF,6+ Mos 12/28/2015  . PFIZER SARS-COV-2 Vaccination 02/26/2019, 03/28/2019, 10/04/2019  . PPD Test 07/19/2019  . Pneumococcal Polysaccharide-23 11/16/2018  . Tdap 03/22/2013  . Zoster Recombinat (Shingrix) 11/16/2018    TDAP status: Up to date  Flu Vaccine status: Due, Education has been provided regarding the importance of this vaccine. Advised may receive this vaccine at local pharmacy  or Health Dept. Aware to provide a copy of the vaccination record if obtained from local pharmacy or Health Dept. Verbalized acceptance and understanding.  Pneumococcal vaccine status: Due, Education has been provided regarding the importance of this vaccine. Advised may receive this vaccine at local pharmacy or Health Dept. Aware to provide a copy of the vaccination record if obtained from local pharmacy or Health Dept. Verbalized acceptance and understanding.  Covid-19 vaccine status: Completed vaccines  Qualifies for Shingles Vaccine? Yes   Zostavax completed No   Shingrix Completed?: No.    Education has been provided regarding the importance of this vaccine. Patient has been advised to call insurance company to determine out of pocket expense if they have not yet received this vaccine. Advised may also receive vaccine at local pharmacy or Health Dept. Verbalized acceptance and understanding.  Screening Tests Health Maintenance  Topic Date Due  . INFLUENZA VACCINE  08/07/2019  . COLONOSCOPY  09/14/2019  . PNA vac Low Risk Adult (2 of 2 - PCV13) 11/16/2019  . TETANUS/TDAP  03/23/2023  . COVID-19 Vaccine  Completed  . Hepatitis C Screening  Completed    Health Maintenance  Health Maintenance Due  Topic Date Due  . INFLUENZA VACCINE  08/07/2019  . COLONOSCOPY  09/14/2019  . PNA vac Low Risk Adult (2 of 2 - PCV13) 11/16/2019    Colorectal cancer  screening: Referral to GI placed today. Pt aware the office will call re: appt.  Lung Cancer Screening: (Low Dose CT Chest recommended if Age 11-80 years, 30 pack-year currently smoking OR have quit w/in 15years.) does not qualify.   Additional Screening:  Hepatitis C Screening: Up to date  Vision Screening: Recommended annual ophthalmology exams for early detection of glaucoma and other disorders of the eye. Is the patient up to date with their annual eye exam?  Yes  Who is the provider or what is the name of the office in which the patient attends annual eye exams? [facility]. If pt is not established with a provider, would they like to be referred to a provider to establish care? No .   Dental Screening: Recommended annual dental exams for proper oral hygiene  Community Resource Referral / Chronic Care Management: CRR required this visit?  No   CCM required this visit?  No      Plan:     I have personally reviewed and noted the following in the patient's chart:   . Medical and social history . Use of alcohol, tobacco or illicit drugs  . Current medications and supplements . Functional ability and status . Nutritional status . Physical activity . Advanced directives . List of other physicians . Hospitalizations, surgeries, and ER visits in previous 12 months . Vitals . Screenings to include cognitive, depression, and falls . Referrals and appointments  In addition, I have reviewed and discussed with patient certain preventive protocols, quality metrics, and best practice recommendations. A written personalized care plan for preventive services as well as general preventive health recommendations were provided to patient.     Kamin Niblack [place], [place]   19/37/9024   Nurse Notes: Pt would like to receive his Prevnar 13 vaccine and second Shingrix dose at next in office apt. Pt to receive his flu shot at his local pharmacy.

## 2019-12-26 ENCOUNTER — Ambulatory Visit (INDEPENDENT_AMBULATORY_CARE_PROVIDER_SITE_OTHER): Payer: Medicare Other

## 2019-12-26 ENCOUNTER — Other Ambulatory Visit: Payer: Self-pay

## 2019-12-26 DIAGNOSIS — Z0000 Encounter for general adult medical examination without abnormal findings: Secondary | ICD-10-CM

## 2019-12-26 DIAGNOSIS — Z1211 Encounter for screening for malignant neoplasm of colon: Secondary | ICD-10-CM | POA: Diagnosis not present

## 2019-12-26 DIAGNOSIS — Z86010 Personal history of colonic polyps: Secondary | ICD-10-CM | POA: Diagnosis not present

## 2019-12-26 NOTE — Patient Instructions (Signed)
Micheal Mccoy , Thank you for taking time to come for your Medicare Wellness Visit. I appreciate your ongoing commitment to your health goals. Please review the following plan we discussed and let me know if I can assist you in the future.   Screening recommendations/referrals: Colonoscopy: Referral placed today. Pt aware office will call DT:OIZT.  Recommended yearly ophthalmology/optometry visit for glaucoma screening and checkup Recommended yearly dental visit for hygiene and checkup  Vaccinations: Influenza vaccine: Currently due, pt to receive at local pharmacy.  Pneumococcal vaccine: Prevnar 13 due. Pt to receive at next in office apt.  Tdap vaccine: Up to date, due 03/2023 Shingles vaccine: Shingrix dose # 2 due. Pt to receive at next in office apt.     Advanced directives: Please bring a copy of your POA (Power of Attorney) and/or Living Will to your next appointment.   Conditions/risks identified: Recommend to eat more salads and more green leafy vegetables in daily diet.   Next appointment: 02/14/19 @ 9:00 AM with Dr Caryn Section  Preventive Care 39 Years and Older, Male Preventive care refers to lifestyle choices and visits with your health care provider that can promote health and wellness. What does preventive care include?  A yearly physical exam. This is also called an annual well check.  Dental exams once or twice a year.  Routine eye exams. Ask your health care provider how often you should have your eyes checked.  Personal lifestyle choices, including:  Daily care of your teeth and gums.  Regular physical activity.  Eating a healthy diet.  Avoiding tobacco and drug use.  Limiting alcohol use.  Practicing safe sex.  Taking low doses of aspirin every day.  Taking vitamin and mineral supplements as recommended by your health care provider. What happens during an annual well check? The services and screenings done by your health care provider during your annual well  check will depend on your age, overall health, lifestyle risk factors, and family history of disease. Counseling  Your health care provider may ask you questions about your:  Alcohol use.  Tobacco use.  Drug use.  Emotional well-being.  Home and relationship well-being.  Sexual activity.  Eating habits.  History of falls.  Memory and ability to understand (cognition).  Work and work Statistician. Screening  You may have the following tests or measurements:  Height, weight, and BMI.  Blood pressure.  Lipid and cholesterol levels. These may be checked every 5 years, or more frequently if you are over 76 years old.  Skin check.  Lung cancer screening. You may have this screening every year starting at age 56 if you have a 30-pack-year history of smoking and currently smoke or have quit within the past 15 years.  Fecal occult blood test (FOBT) of the stool. You may have this test every year starting at age 45.  Flexible sigmoidoscopy or colonoscopy. You may have a sigmoidoscopy every 5 years or a colonoscopy every 10 years starting at age 51.  Prostate cancer screening. Recommendations will vary depending on your family history and other risks.  Hepatitis C blood test.  Hepatitis B blood test.  Sexually transmitted disease (STD) testing.  Diabetes screening. This is done by checking your blood sugar (glucose) after you have not eaten for a while (fasting). You may have this done every 1-3 years.  Abdominal aortic aneurysm (AAA) screening. You may need this if you are a current or former smoker.  Osteoporosis. You may be screened starting at age 76 if  you are at high risk. Talk with your health care provider about your test results, treatment options, and if necessary, the need for more tests. Vaccines  Your health care provider may recommend certain vaccines, such as:  Influenza vaccine. This is recommended every year.  Tetanus, diphtheria, and acellular  pertussis (Tdap, Td) vaccine. You may need a Td booster every 10 years.  Zoster vaccine. You may need this after age 35.  Pneumococcal 13-valent conjugate (PCV13) vaccine. One dose is recommended after age 52.  Pneumococcal polysaccharide (PPSV23) vaccine. One dose is recommended after age 33. Talk to your health care provider about which screenings and vaccines you need and how often you need them. This information is not intended to replace advice given to you by your health care provider. Make sure you discuss any questions you have with your health care provider. Document Released: 01/19/2015 Document Revised: 09/12/2015 Document Reviewed: 10/24/2014 Elsevier Interactive Patient Education  2017 [place] Prevention in the Home Falls can cause injuries. They can happen to people of all ages. There are many things you can do to make your home safe and to help prevent falls. What can I do on the outside of my home?  Regularly fix the edges of walkways and driveways and fix any cracks.  Remove anything that might make you trip as you walk through a door, such as a raised step or threshold.  Trim any bushes or trees on the path to your home.  Use bright outdoor lighting.  Clear any walking paths of anything that might make someone trip, such as rocks or tools.  Regularly check to see if handrails are loose or broken. Make sure that both sides of any steps have handrails.  Any raised decks and porches should have guardrails on the edges.  Have any leaves, snow, or ice cleared regularly.  Use sand or salt on walking paths during winter.  Clean up any spills in your garage right away. This includes oil or grease spills. What can I do in the bathroom?  Use night lights.  Install grab bars by the toilet and in the tub and shower. Do not use towel bars as grab bars.  Use non-skid mats or decals in the tub or shower.  If you need to sit down in the shower, use a plastic,  non-slip stool.  Keep the floor dry. Clean up any water that spills on the floor as soon as it happens.  Remove soap buildup in the tub or shower regularly.  Attach bath mats securely with double-sided non-slip rug tape.  Do not have throw rugs and other things on the floor that can make you trip. What can I do in the bedroom?  Use night lights.  Make sure that you have a light by your bed that is easy to reach.  Do not use any sheets or blankets that are too big for your bed. They should not hang down onto the floor.  Have a firm chair that has side arms. You can use this for support while you get dressed.  Do not have throw rugs and other things on the floor that can make you trip. What can I do in the kitchen?  Clean up any spills right away.  Avoid walking on wet floors.  Keep items that you use a lot in easy-to-reach places.  If you need to reach something above you, use a strong step stool that has a grab bar.  Keep electrical cords  out of the way.  Do not use floor polish or wax that makes floors slippery. If you must use wax, use non-skid floor wax.  Do not have throw rugs and other things on the floor that can make you trip. What can I do with my stairs?  Do not leave any items on the stairs.  Make sure that there are handrails on both sides of the stairs and use them. Fix handrails that are broken or loose. Make sure that handrails are as long as the stairways.  Check any carpeting to make sure that it is firmly attached to the stairs. Fix any carpet that is loose or worn.  Avoid having throw rugs at the top or bottom of the stairs. If you do have throw rugs, attach them to the floor with carpet tape.  Make sure that you have a light switch at the top of the stairs and the bottom of the stairs. If you do not have them, ask someone to add them for you. What else can I do to help prevent falls?  Wear shoes that:  Do not have high heels.  Have rubber  bottoms.  Are comfortable and fit you well.  Are closed at the toe. Do not wear sandals.  If you use a stepladder:  Make sure that it is fully opened. Do not climb a closed stepladder.  Make sure that both sides of the stepladder are locked into place.  Ask someone to hold it for you, if possible.  Clearly mark and make sure that you can see:  Any grab bars or handrails.  First and last steps.  Where the edge of each step is.  Use tools that help you move around (mobility aids) if they are needed. These include:  Canes.  Walkers.  Scooters.  Crutches.  Turn on the lights when you go into a dark area. Replace any light bulbs as soon as they burn out.  Set up your furniture so you have a clear path. Avoid moving your furniture around.  If any of your floors are uneven, fix them.  If there are any pets around you, be aware of where they are.  Review your medicines with your doctor. Some medicines can make you feel dizzy. This can increase your chance of falling. Ask your doctor what other things that you can do to help prevent falls. This information is not intended to replace advice given to you by your health care provider. Make sure you discuss any questions you have with your health care provider. Document Released: 10/19/2008 Document Revised: 05/31/2015 Document Reviewed: 01/27/2014 Elsevier Interactive Patient Education  2017 [employer].

## 2020-01-16 ENCOUNTER — Other Ambulatory Visit: Payer: Self-pay

## 2020-01-16 ENCOUNTER — Telehealth (INDEPENDENT_AMBULATORY_CARE_PROVIDER_SITE_OTHER): Payer: Self-pay | Admitting: Gastroenterology

## 2020-01-16 DIAGNOSIS — Z86010 Personal history of colonic polyps: Secondary | ICD-10-CM

## 2020-01-16 MED ORDER — NA SULFATE-K SULFATE-MG SULF 17.5-3.13-1.6 GM/177ML PO SOLN: 1.0000 | Freq: Once | ORAL | 0 refills | Status: AC

## 2020-01-16 NOTE — Progress Notes (Signed)
Gastroenterology Pre-Procedure Review  Request Date: Tuesday 01/31/20 Requesting Physician: Dr. Allen Norris  PATIENT REVIEW QUESTIONS: The patient responded to the following health history questions as indicated:    1. Are you having any GI issues? no 2. Do you have a personal history of Polyps? yes (09/14/14 colonoscopy performed by Dr. Allen Norris) 3. Do you have a family history of Colon Cancer or Polyps? no 4. Diabetes Mellitus? no 5. Joint replacements in the past 12 months?no 6. Major health problems in the past 3 months?no 7. Any artificial heart valves, MVP, or defibrillator?no    MEDICATIONS & ALLERGIES:    Patient reports the following regarding taking any anticoagulation/antiplatelet therapy:   Plavix, Coumadin, Eliquis, Xarelto, Lovenox, Pradaxa, Brilinta, or Effient? no Aspirin? no  Patient confirms/reports the following medications:  Current Outpatient Medications  Medication Sig Dispense Refill  . Na Sulfate-K Sulfate-Mg Sulf 17.5-3.13-1.6 GM/177ML SOLN Take 1 kit by mouth once for 1 dose. 354 mL 0  . tadalafil (CIALIS) 20 MG tablet Take 1 tablet (20 mg total) by mouth daily. Or take every other day for ED (Patient not taking: Reported on 01/16/2020) 90 tablet 3   No current facility-administered medications for this visit.    Patient confirms/reports the following allergies:  Allergies  Allergen Reactions  . Aleve [Naproxen] Hives    Orders Placed This Encounter  Procedures  . Procedural/ Surgical Case Request: COLONOSCOPY WITH PROPOFOL    Standing Status:   Standing    Number of Occurrences:   1    Order Specific Question:   Pre-op diagnosis    Answer:   personal history of colon polyps    Order Specific Question:   CPT Code    Answer:   42320    AUTHORIZATION INFORMATION Primary Insurance: 1D#: Group #:  Secondary Insurance: 1D#: Group #:  SCHEDULE INFORMATION: Date: 01/31/20 Time: Location:[facility]

## 2020-01-26 ENCOUNTER — Other Ambulatory Visit
Admission: RE | Admit: 2020-01-26 | Discharge: 2020-01-26 | Disposition: A | Discharge status: Home / Self Care | Payer: Medicare PPO | Source: Ambulatory Visit | Attending: Gastroenterology | Admitting: Gastroenterology

## 2020-01-26 ENCOUNTER — Other Ambulatory Visit: Payer: Self-pay

## 2020-01-26 DIAGNOSIS — Z01812 Encounter for preprocedural laboratory examination: Secondary | ICD-10-CM | POA: Insufficient documentation

## 2020-01-26 DIAGNOSIS — Z20822 Contact with and (suspected) exposure to covid-19: Secondary | ICD-10-CM | POA: Diagnosis not present

## 2020-01-26 LAB — SARS CORONAVIRUS 2 (TAT 6-24 HRS): SARS Coronavirus 2: NEGATIVE

## 2020-01-27 ENCOUNTER — Other Ambulatory Visit: Payer: Medicare PPO

## 2020-01-31 ENCOUNTER — Encounter: Payer: Self-pay | Admitting: Gastroenterology

## 2020-01-31 ENCOUNTER — Ambulatory Visit: Payer: Medicare PPO | Admitting: Registered Nurse

## 2020-01-31 ENCOUNTER — Ambulatory Visit: Payer: BC Managed Care – PPO | Admitting: Urology

## 2020-01-31 ENCOUNTER — Ambulatory Visit
Admission: RE | Admit: 2020-01-31 | Discharge: 2020-01-31 | Disposition: A | Discharge status: Home / Self Care | Payer: Medicare PPO | Source: Ambulatory Visit | Attending: Gastroenterology | Admitting: Gastroenterology

## 2020-01-31 ENCOUNTER — Encounter
Admission: RE | Disposition: A | Discharge status: Home / Self Care | Payer: Self-pay | Source: Ambulatory Visit | Attending: Gastroenterology

## 2020-01-31 DIAGNOSIS — Z86010 Personal history of colonic polyps: Secondary | ICD-10-CM | POA: Diagnosis not present

## 2020-01-31 DIAGNOSIS — Z1211 Encounter for screening for malignant neoplasm of colon: Secondary | ICD-10-CM | POA: Insufficient documentation

## 2020-01-31 HISTORY — PX: COLONOSCOPY WITH PROPOFOL: SHX5780

## 2020-01-31 SURGERY — COLONOSCOPY WITH PROPOFOL
Anesthesia: General

## 2020-01-31 MED ORDER — PROPOFOL 500 MG/50ML IV EMUL
INTRAVENOUS | Status: DC | PRN
  Administered 2020-01-31: 150 ug/kg/min via INTRAVENOUS

## 2020-01-31 MED ORDER — SODIUM CHLORIDE 0.9 % IV SOLN: INTRAVENOUS | Status: DC

## 2020-01-31 MED ORDER — PROPOFOL 500 MG/50ML IV EMUL
INTRAVENOUS | Status: AC
  Filled 2020-01-31: qty 50

## 2020-01-31 MED ORDER — PROPOFOL 10 MG/ML IV BOLUS
INTRAVENOUS | Status: AC
  Filled 2020-01-31: qty 20

## 2020-01-31 MED ORDER — LIDOCAINE HCL (PF) 2 % IJ SOLN
INTRAMUSCULAR | Status: AC
  Filled 2020-01-31: qty 10

## 2020-01-31 MED ORDER — DEXMEDETOMIDINE (PRECEDEX) IN NS 20 MCG/5ML (4 MCG/ML) IV SYRINGE
PREFILLED_SYRINGE | INTRAVENOUS | Status: AC
  Filled 2020-01-31: qty 5

## 2020-01-31 MED ORDER — LIDOCAINE HCL (CARDIAC) PF 100 MG/5ML IV SOSY
PREFILLED_SYRINGE | INTRAVENOUS | Status: DC | PRN
  Administered 2020-01-31: 100 mg via INTRAVENOUS

## 2020-01-31 MED ORDER — PROPOFOL 10 MG/ML IV BOLUS
INTRAVENOUS | Status: DC | PRN
Start: 2020-01-31 — End: 2020-01-31
  Administered 2020-01-31: 90 mg via INTRAVENOUS

## 2020-01-31 NOTE — Anesthesia Postprocedure Evaluation (Signed)
Anesthesia Post Note  Patient: [name]  Procedure(s) Performed: COLONOSCOPY WITH PROPOFOL (N/A )  Patient location during evaluation: Endoscopy Anesthesia Type: General Level of consciousness: awake and alert Pain management: pain level controlled Vital Signs Assessment: post-procedure vital signs reviewed and stable Respiratory status: spontaneous breathing, nonlabored ventilation and respiratory function stable Cardiovascular status: blood pressure returned to baseline and stable Postop Assessment: no apparent nausea or vomiting Anesthetic complications: no   No complications documented.   Last Vitals:  Vitals:   01/31/20 1040 01/31/20 1050  BP: (!) 129/91 125/89  Pulse: 67 (!) 56  Resp: 16 16  Temp:    SpO2: 99% 100%    Last Pain:  Vitals:   01/31/20 0926  TempSrc: Temporal  PainSc: 0-No pain                 Alphonsus Sias

## 2020-01-31 NOTE — Transfer of Care (Signed)
Immediate Anesthesia Transfer of Care Note  Patient: Micheal Mccoy  Procedure(s) Performed: COLONOSCOPY WITH PROPOFOL (N/A )  Patient Location: Endoscopy Unit  Anesthesia Type:General  Level of Consciousness: drowsy  Airway & Oxygen Therapy: Patient Spontanous Breathing  Post-op Assessment: Report given to RN and Post -op Vital signs reviewed and stable  Post vital signs: Reviewed and stable  Last Vitals:  Vitals Value Taken Time  BP 107/78 01/31/20 1015  Temp    Pulse 69 01/31/20 1015  Resp 16 01/31/20 1015  SpO2 98 % 01/31/20 1015    Last Pain:  Vitals:   01/31/20 0926  TempSrc: Temporal  PainSc: 0-No pain         Complications: No complications documented.

## 2020-01-31 NOTE — OR Nursing (Signed)
Pt's ride was 30-45 minutes late.

## 2020-01-31 NOTE — H&P (Signed)
Lucilla Lame, MD [address] [place], [place] [address] Phone:[phone] Fax : [phone]  Primary Care Physician:  [name], MD Primary Gastroenterologist:  Dr. Allen Norris  Pre-Procedure History & Physical: HPI:  Micheal Mccoy is a 65 y.o. male is here for an colonoscopy.   Past Medical History:  Diagnosis Date  . Arthritis   . Benign neoplasm of ascending colon   . Degenerative disc disease, cervical   . Esophagitis, reflux 09/29/2005  . Frequency of urination   . GERD (gastroesophageal reflux disease)   . Peyronie's disease    W/ PENILE CURVATURE    Past Surgical History:  Procedure Laterality Date  . ANTERIOR CERVICAL DECOMP/DISCECTOMY FUSION N/A 01/25/2018   Procedure: ANTERIOR CERVICAL DECOMPRESSION/DISCECTOMY FUSION 1 LEVEL C7-T1, T1-T2;  Surgeon: Deetta Perla, MD;  Location: [facility];  Service: Neurosurgery;  Laterality: N/A;  . ANTERIOR CERVICAL DISCECTOMY  01/25/2018   Dr. Lacinda Axon, ACDF ARTHRODESIS, OSTEOPHYTECTOMY &DECOMPRESSION OF SPINAL CORD AND/ORNERVE ROOTS; BELOW C2--C7-T1  . COLONOSCOPY WITH PROPOFOL N/A 09/14/2014   Procedure: COLONOSCOPY WITH PROPOFOL;  Surgeon: Lucilla Lame, MD;  Location: [facility];  Service: Endoscopy;  Laterality: N/A;  . HERNIA REPAIR Bilateral 04/09/2015   Bard 3D Max mesh for bilateral inguinal hernias, primary repair of umbilical henria.   Marland Kitchen HOLEP-LASER ENUCLEATION OF THE PROSTATE WITH MORCELLATION N/A 02/17/2019   Procedure: HOLEP-LASER ENUCLEATION OF THE PROSTATE WITH MORCELLATION;  Surgeon: Billey Co, MD;  Location: [facility];  Service: Urology;  Laterality: N/A;  . INGUINAL HERNIA REPAIR Bilateral 04/09/2015   Procedure: LAPAROSCOPIC INGUINAL HERNIA;  Surgeon: Robert Bellow, MD;  Location: [facility];  Service: General;  Laterality: Bilateral;  . NESBIT PROCEDURE N/A 06/28/2012   Procedure: 16 DOT PLACTATION PROCEDURE;  Surgeon: Claybon Jabs, MD;  Location: [facility];  Service:  Urology;  Laterality: N/A;  . Peyronies Disease  06/2012  . POLYPECTOMY  09/14/2014   Procedure: POLYPECTOMY;  Surgeon: Lucilla Lame, MD;  Location: [facility];  Service: Endoscopy;;  . PROSTATE BIOPSY  03-02-15   neg  . THORACIC DISCECTOMY  01/25/2018   Dr. Lacinda Axon, ARTHRODESIS ANTERIOR INTERBODY W/DISCECTOMY ADDITIONAL LEVEL T1-T2  . UMBILICAL HERNIA REPAIR N/A 04/09/2015   Procedure: HERNIA REPAIR UMBILICAL ADULT;  Surgeon: Robert Bellow, MD;  Location: [facility];  Service: General;  Laterality: N/A;    Prior to Admission medications   Medication Sig Start Date End Date Taking? Authorizing Provider  tadalafil (CIALIS) 20 MG tablet Take 1 tablet (20 mg total) by mouth daily. Or take every other day for ED Patient not taking: Reported on 01/16/2020 12/12/19   Billey Co, MD    Allergies as of 01/16/2020 - Review Complete 01/16/2020  Allergen Reaction Noted  . Aleve [naproxen] Hives 03/07/2019    Family History  Problem Relation Age of Onset  . Prostate cancer Neg Hx   . Bladder Cancer Neg Hx   . Kidney cancer Neg Hx     Social History   Socioeconomic History  . Marital status: Married    Spouse name: Not on file  . Number of children: 3  . Years of education: PHD  . Highest education level: Doctorate  Occupational History  . Occupation: Associate Professor    Employer: [employer]	[place]    Comment: retired  Tobacco Use  . Smoking status: Never Smoker  . Smokeless tobacco: Never Used  Vaping Use  . Vaping Use: Never used  Substance and Sexual Activity  .  Alcohol use: No  . Drug use: No  . Sexual activity: Yes  Other Topics Concern  . Not on file  Social History Narrative  . Not on file   Social Determinants of Health   Financial Resource Strain: Low Risk   . Difficulty of Paying Living Expenses: Not hard at all  Food Insecurity: No Food Insecurity  . Worried About Charity fundraiser in the Last Year: Never true  . Ran Out of Food in the Last Year:  Never true  Transportation Needs: No Transportation Needs  . Lack of Transportation (Medical): No  . Lack of Transportation (Non-Medical): No  Physical Activity: Sufficiently Active  . Days of Exercise per Week: 4 days  . Minutes of Exercise per Session: 90 min  Stress: No Stress Concern Present  . Feeling of Stress : Not at all  Social Connections: Moderately Integrated  . Frequency of Communication with Friends and Family: More than three times a week  . Frequency of Social Gatherings with Friends and Family: More than three times a week  . Attends Religious Services: Never  . Active Member of Clubs or Organizations: Yes  . Attends Archivist Meetings: More than 4 times per year  . Marital Status: Married  Human resources officer Violence: Not At Risk  . Fear of Current or Ex-Partner: No  . Emotionally Abused: No  . Physically Abused: No  . Sexually Abused: No    Review of Systems: See HPI, otherwise negative ROS  Physical Exam: BP (!) 143/96   Pulse 81   Temp (!) 97 F (36.1 C) (Temporal)   Resp 18   Ht 6\' 3"  (1.905 m)   Wt 84.1 kg   SpO2 100%   BMI 23.17 kg/m  General:   Alert,  pleasant and cooperative in NAD Head:  Normocephalic and atraumatic. Neck:  Supple; no masses or thyromegaly. Lungs:  Clear throughout to auscultation.    Heart:  Regular rate and rhythm. Abdomen:  Soft, nontender and nondistended. Normal bowel sounds, without guarding, and without rebound.   Neurologic:  Alert and  oriented x4;  grossly normal neurologically.  Impression/Plan: KEINAN BROUILLET is here for an colonoscopy to be performed for a history of adenomatous polyps on 09/2014   Risks, benefits, limitations, and alternatives regarding  colonoscopy have been reviewed with the patient.  Questions have been answered.  All parties agreeable.   Lucilla Lame, MD  01/31/2020, 9:31 AM

## 2020-01-31 NOTE — Op Note (Signed)
[facility] Gastroenterology Patient Name: Micheal Mccoy Procedure Date: 01/31/2020 9:34 AM MRN: [id number] Account #: [id number] Date of Birth: 05-09-1955 Admit Type: Outpatient Age: 65 Room: [facility] ENDO ROOM 4 Gender: Male Note Status: Finalized Procedure:             Colonoscopy Indications:           High risk colon cancer surveillance: Personal history                         of colonic polyps Providers:             Lucilla Lame MD, MD Referring MD:          [name], MD (Referring MD) Medicines:             Propofol per Anesthesia Complications:         No immediate complications. Procedure:             Pre-Anesthesia Assessment:                        - Prior to the procedure, a History and Physical was                         performed, and patient medications and allergies were                         reviewed. The patient's tolerance of previous                         anesthesia was also reviewed. The risks and benefits                         of the procedure and the sedation options and risks                         were discussed with the patient. All questions were                         answered, and informed consent was obtained. Prior                         Anticoagulants: The patient has taken no previous                         anticoagulant or antiplatelet agents. ASA Grade                         Assessment: II - A patient with mild systemic disease.                         After reviewing the risks and benefits, the patient                         was deemed in satisfactory condition to undergo the                         procedure.  After obtaining informed consent, the colonoscope was                         passed under direct vision. Throughout the procedure,                         the patient's blood pressure, pulse, and oxygen                         saturations were monitored continuously. The                          Colonoscope was introduced through the anus and                         advanced to the the cecum, identified by appendiceal                         orifice and ileocecal valve. The colonoscopy was                         performed without difficulty. The patient tolerated                         the procedure well. The quality of the bowel                         preparation was excellent. Findings:      The perianal and digital rectal examinations were normal.      The colon (entire examined portion) appeared normal. Impression:            - The entire examined colon is normal.                        - No specimens collected. Recommendation:        - Discharge patient to home.                        - Resume previous diet.                        - Continue present medications.                        - Repeat colonoscopy in 7 years for surveillance. Procedure Code(s):     --- Professional ---                        [phone], Colonoscopy, flexible; diagnostic, including                         collection of specimen(s) by brushing or washing, when                         performed (separate procedure) Diagnosis Code(s):     --- Professional ---                        Z86.010, Personal history of colonic polyps CPT copyright 2019 American Medical Association. All rights reserved. The codes documented in this  report are preliminary and upon coder review may  be revised to meet current compliance requirements. Lucilla Lame MD, MD 01/31/2020 10:15:18 AM This report has been signed electronically. Number of Addenda: 0 Note Initiated On: 01/31/2020 9:34 AM Scope Withdrawal Time: 0 hours 6 minutes 58 seconds  Total Procedure Duration: 0 hours 9 minutes 46 seconds  Estimated Blood Loss:  Estimated blood loss: none.      [facility]

## 2020-01-31 NOTE — Anesthesia Preprocedure Evaluation (Signed)
Anesthesia Evaluation  Patient identified by MRN, date of birth, ID band Patient awake    Reviewed: Allergy & Precautions, H&P , NPO status , reviewed documented beta blocker date and time   Airway Mallampati: II  TM Distance: >3 FB Neck ROM: full    Dental  (+) Teeth Intact   Pulmonary    Pulmonary exam normal        Cardiovascular Normal cardiovascular exam     Neuro/Psych    GI/Hepatic GERD  Medicated and Controlled,  Endo/Other    Renal/GU      Musculoskeletal  (+) Arthritis ,   Abdominal   Peds  Hematology   Anesthesia Other Findings Past Medical History: No date: Arthritis No date: Benign neoplasm of ascending colon No date: Degenerative disc disease, cervical 09/29/2005: Esophagitis, reflux No date: Frequency of urination No date: GERD (gastroesophageal reflux disease) No date: Peyronie's disease     Comment:  W/ PENILE CURVATURE  Past Surgical History: 01/25/2018: ANTERIOR CERVICAL DECOMP/DISCECTOMY FUSION; N/A     Comment:  Procedure: ANTERIOR CERVICAL DECOMPRESSION/DISCECTOMY               FUSION 1 LEVEL C7-T1, T1-T2;  Surgeon: Deetta Perla, MD;               Location: [facility];  Service: Neurosurgery;  Laterality:               N/A; 01/25/2018: ANTERIOR CERVICAL DISCECTOMY     Comment:  Dr. Lacinda Axon, ACDF ARTHRODESIS, OSTEOPHYTECTOMY               &DECOMPRESSION OF SPINAL CORD AND/ORNERVE ROOTS; BELOW               C2--C7-T1 09/14/2014: COLONOSCOPY WITH PROPOFOL; N/A     Comment:  Procedure: COLONOSCOPY WITH PROPOFOL;  Surgeon: Lucilla Lame, MD;  Location: [place];  Service:               Endoscopy;  Laterality: N/A; 04/09/2015: HERNIA REPAIR; Bilateral     Comment:  Bard 3D Max mesh for bilateral inguinal hernias, primary              repair of umbilical henria.  02/17/2019: HOLEP-LASER ENUCLEATION OF THE PROSTATE WITH MORCELLATION;  N/A     Comment:  Procedure:  HOLEP-LASER ENUCLEATION OF THE PROSTATE WITH               MORCELLATION;  Surgeon: Billey Co, MD;  Location:               [facility];  Service: Urology;  Laterality: N/A; 04/09/2015: INGUINAL HERNIA REPAIR; Bilateral     Comment:  Procedure: LAPAROSCOPIC INGUINAL HERNIA;  Surgeon:               Robert Bellow, MD;  Location: [facility];  Service:               General;  Laterality: Bilateral; 06/28/2012: NESBIT PROCEDURE; N/A     Comment:  Procedure: 16 DOT PLACTATION PROCEDURE;  Surgeon: Claybon Jabs, MD;  Location: [place];                Service: Urology;  Laterality: N/A; 06/2012: Peyronies Disease 09/14/2014: POLYPECTOMY     Comment:  Procedure: POLYPECTOMY;  Surgeon: Lucilla Lame, MD;  Location: [place];  Service: Endoscopy;; 03-02-15: PROSTATE BIOPSY     Comment:  neg 01/25/2018: THORACIC DISCECTOMY     Comment:  Dr. Lacinda Axon, ARTHRODESIS ANTERIOR INTERBODY W/DISCECTOMY               ADDITIONAL LEVEL R1-V4 0/0/8676: UMBILICAL HERNIA REPAIR; N/A     Comment:  Procedure: HERNIA REPAIR UMBILICAL ADULT;  Surgeon:               Robert Bellow, MD;  Location: [facility];  Service:               General;  Laterality: N/A;  BMI    Body Mass Index: 23.17 kg/m      Reproductive/Obstetrics                             Anesthesia Physical Anesthesia Plan  ASA: II  Anesthesia Plan: General   Post-op Pain Management:    Induction: Intravenous  PONV Risk Score and Plan: Treatment may vary due to age or medical condition and TIVA  Airway Management Planned: Nasal Cannula and Natural Airway  Additional Equipment:   Intra-op Plan:   Post-operative Plan:   Informed Consent: I have reviewed the patients History and Physical, chart, labs and discussed the procedure including the risks, benefits and alternatives for the proposed anesthesia with the patient or authorized representative who has  indicated his/her understanding and acceptance.     Dental Advisory Given  Plan Discussed with: CRNA  Anesthesia Plan Comments:         Anesthesia Quick Evaluation

## 2020-02-01 ENCOUNTER — Encounter: Payer: Self-pay | Admitting: Gastroenterology

## 2020-02-06 NOTE — Progress Notes (Signed)
02/07/2020 2:40 PM   SHERMAN PERAULT 07-31-1955 [id number]  Referring provider: [name], MD [id number] [place],  [place] [address]  Chief Complaint  Patient presents with  . Benign Prostatic Hypertrophy  . Erectile Dysfunction   Urological history 1. Peyronie's disease - s/p plication [id number]  2. BPH with LU TS - I PSS 5/5 - PVR 0 mL - retrograde ejaculation with Rapaflo  - s/p HoLEP 02/2019  3. ED - SHIM 12 - contributing factors of age, BPH, relationship issues and Peyronie's disease - managed with tadalafil 20 mg, daily   4. High risk hematuria - non smoker - CTU 2017 - showed 130 gm prostate. It also showed a benign Bosniak 2 left upper pole cyst - cysto 2017 Enlarged prostate - 6 cm in length. Visually obstructive and intravesical lobe of prostate - cysto during HoLEP 02/2019 no suspicious lesions - UA 3-10 RBC's   5. Elevated PSA - PSA Trend Component     Latest Ref Rng & Units 05/23/2015 11/22/2015 05/21/2016 11/17/2016  Prostate Specific Ag, Serum     0.0 - 4.0 ng/mL 2.8 2.3 3.3 4.3 (H)  Reflex Criteria            Component     Latest Ref Rng & Units 05/21/2017 11/20/2017 11/16/2018 11/30/2018  Prostate Specific Ag, Serum     0.0 - 4.0 ng/mL 3.9 3.1 5.5 (H) 7.0 (H)  Reflex Criteria        Comment    Component     Latest Ref Rng & Units 12/12/2019  Prostate Specific Ag, Serum     0.0 - 4.0 ng/mL 1.6  Reflex Criteria      Comment   Prostate biopsy iPSA 4.2 - negative  Prostate MRI 12/2018 for PSA 7.  No findings suspicious for macroscopic prostate cancer.  PI-RADS 1.  BPH.  Calculated prostate volume 87 mL Benign tissue with HoLEP 02/2019  HPI: ARKEEM GIMA is a 65 y.o. male who presents today for follow up for ED.   He continues to experience incontinence with exercise, during defecation and straining during intercourse.   Patient denies any modifying or aggravating factors.  Patient denies any gross hematuria, dysuria or  suprapubic/flank pain.  Patient denies any fevers, chills, nausea or vomiting.   Patient still having spontaneous erections.   He denies any pain or curvature with erections.  He feels that relationship stressors are also contributing greatly to his ED.    His testosterone level was found to be 878 on 12/12/2019.     SHIM    Row Name 02/07/20 0855         SHIM: Over the last 6 months:   How do you rate your confidence that you could get and keep an erection? Moderate     When you had erections with sexual stimulation, how often were your erections hard enough for penetration (entering your partner)? Sometimes (about half the time)     During sexual intercourse, how often were you able to maintain your erection after you had penetrated (entered) your partner? A Few Times (much less than half the time)     During sexual intercourse, how difficult was it to maintain your erection to completion of intercourse? Very Difficult     When you attempted sexual intercourse, how often was it satisfactory for you? A Few Times (much less than half the time)           SHIM Total  Score   SHIM 12            Score: 1-7 Severe ED 8-11 Moderate ED 12-16 Mild-Moderate ED 17-21 Mild ED 22-25 No ED    PMH: Past Medical History:  Diagnosis Date  . Arthritis   . Benign neoplasm of ascending colon   . Degenerative disc disease, cervical   . Esophagitis, reflux 09/29/2005  . Frequency of urination   . GERD (gastroesophageal reflux disease)   . Peyronie's disease    W/ PENILE CURVATURE    Surgical History: Past Surgical History:  Procedure Laterality Date  . ANTERIOR CERVICAL DECOMP/DISCECTOMY FUSION N/A 01/25/2018   Procedure: ANTERIOR CERVICAL DECOMPRESSION/DISCECTOMY FUSION 1 LEVEL C7-T1, T1-T2;  Surgeon: Deetta Perla, MD;  Location: [facility];  Service: Neurosurgery;  Laterality: N/A;  . ANTERIOR CERVICAL DISCECTOMY  01/25/2018   Dr. Lacinda Axon, ACDF ARTHRODESIS, OSTEOPHYTECTOMY &DECOMPRESSION  OF SPINAL CORD AND/ORNERVE ROOTS; BELOW C2--C7-T1  . COLONOSCOPY WITH PROPOFOL N/A 09/14/2014   Procedure: COLONOSCOPY WITH PROPOFOL;  Surgeon: Lucilla Lame, MD;  Location: [place];  Service: Endoscopy;  Laterality: N/A;  . COLONOSCOPY WITH PROPOFOL N/A 01/31/2020   Procedure: COLONOSCOPY WITH PROPOFOL;  Surgeon: Lucilla Lame, MD;  Location: [facility];  Service: Endoscopy;  Laterality: N/A;  . HERNIA REPAIR Bilateral 04/09/2015   Bard 3D Max mesh for bilateral inguinal hernias, primary repair of umbilical henria.   Marland Kitchen HOLEP-LASER ENUCLEATION OF THE PROSTATE WITH MORCELLATION N/A 02/17/2019   Procedure: HOLEP-LASER ENUCLEATION OF THE PROSTATE WITH MORCELLATION;  Surgeon: Billey Co, MD;  Location: [facility];  Service: Urology;  Laterality: N/A;  . INGUINAL HERNIA REPAIR Bilateral 04/09/2015   Procedure: LAPAROSCOPIC INGUINAL HERNIA;  Surgeon: Robert Bellow, MD;  Location: [facility];  Service: General;  Laterality: Bilateral;  . NESBIT PROCEDURE N/A 06/28/2012   Procedure: 16 DOT PLACTATION PROCEDURE;  Surgeon: Claybon Jabs, MD;  Location: [facility];  Service: Urology;  Laterality: N/A;  . Peyronies Disease  06/2012  . POLYPECTOMY  09/14/2014   Procedure: POLYPECTOMY;  Surgeon: Lucilla Lame, MD;  Location: [place];  Service: Endoscopy;;  . PROSTATE BIOPSY  03-02-15   neg  . THORACIC DISCECTOMY  01/25/2018   Dr. Lacinda Axon, ARTHRODESIS ANTERIOR INTERBODY W/DISCECTOMY ADDITIONAL LEVEL T1-T2  . UMBILICAL HERNIA REPAIR N/A 04/09/2015   Procedure: HERNIA REPAIR UMBILICAL ADULT;  Surgeon: Robert Bellow, MD;  Location: [facility];  Service: General;  Laterality: N/A;    Home Medications:  Allergies as of 02/07/2020      Reactions   Aleve [naproxen] Hives      Medication List       Accurate as of February 07, 2020 11:59 PM. If you have any questions, ask your nurse or doctor.        tadalafil 20 MG tablet Commonly known as: CIALIS Take 1 tablet (20 mg  total) by mouth daily. Or take every other day for ED       Allergies:  Allergies  Allergen Reactions  . Aleve [Naproxen] Hives    Family History: Family History  Problem Relation Age of Onset  . Prostate cancer Neg Hx   . Bladder Cancer Neg Hx   . Kidney cancer Neg Hx     Social History:  reports that he has never smoked. He has never used smokeless tobacco. He reports that he does not drink alcohol and does not use drugs.  ROS: Pertinent ROS in HPI  Physical Exam: BP 135/85   Pulse 68  Ht 6\' 3"  (1.905 m)   Wt 185 lb (83.9 kg)   BMI 23.12 kg/m   Constitutional:  Well nourished. Alert and oriented, No acute distress. HEENT: [place] AT, mask in place.  Trachea midline Cardiovascular: No clubbing, cyanosis, or edema. Respiratory: Normal respiratory effort, no increased work of breathing. Neurologic: Grossly intact, no focal deficits, moving all 4 extremities. Psychiatric: Normal mood and affect.  Laboratory Data: Lab Results  Component Value Date   WBC 3.7 05/31/2019   HGB 15.2 05/31/2019   HCT 45.2 05/31/2019   MCV 79 05/31/2019   PLT 216 05/31/2019    Lab Results  Component Value Date   CREATININE 0.90 05/31/2019     Lab Results  Component Value Date   TESTOSTERONE 878 12/12/2019    Lab Results  Component Value Date   TSH 1.390 05/31/2019     Lab Results  Component Value Date   AST 18 05/31/2019   Lab Results  Component Value Date   ALT 14 05/31/2019    Urinalysis Component     Latest Ref Rng & Units 02/07/2020          Specific Gravity, UA     1.005 - 1.030 1.025  pH, UA     5.0 - 7.5 6.5  Color, UA     Yellow Yellow  Appearance Ur     Clear Clear  Leukocytes,UA     Negative Negative  Protein,UA     Negative/Trace Negative  Glucose, UA     Negative Negative  Ketones, UA     Negative Negative  RBC, UA     Negative 2+ (A)  Bilirubin, UA     Negative Negative  Urobilinogen, Ur     0.2 - 1.0 mg/dL 0.2  Nitrite, UA      Negative Negative  Microscopic Examination      See below:   Component     Latest Ref Rng & Units 02/07/2020          WBC, UA     0 - 5 /hpf 0-5  RBC     0 - 2 /hpf 3-10 (A)  Epithelial Cells (non renal)     0 - 10 /hpf 0-10  Bacteria, UA     None seen/Few None seen   I have reviewed the labs.   Pertinent Imaging: No recent imaging  Assessment & Plan:    1. Erectile dysfunction - SHIM score is 12 - We discussed trying a different PDE5 inhibitor, WAVE, intra-urethral suppositories, intracavernous vasoactive drug injection therapy, vacuum constriction device and penile prosthesis implantation in order to get a firmer erection, but he deferred at this time - offered referral to a couple's therapist, patient stated his wife already expressed a non-interest in therapy - Continue Cialis 20 mg - but QOD  2. SUI - referral placed for pelvic floor physical therapy  3. Microscopic hematuria - cysto completed last year NED - due to patient's age, he meets the criteria for high risk hematuria per AUA guidelines - CTU is ordered  4. BPH  - s/p HoLEP 02/2019  Return for CT results .  These notes generated with voice recognition software. I apologize for typographical errors.  Zara Council, PA-C  [facility] [address]  [place], [place] [address] [phone]

## 2020-02-07 ENCOUNTER — Encounter: Payer: Self-pay | Admitting: Urology

## 2020-02-07 ENCOUNTER — Ambulatory Visit (INDEPENDENT_AMBULATORY_CARE_PROVIDER_SITE_OTHER): Payer: Medicare PPO | Admitting: Urology

## 2020-02-07 ENCOUNTER — Other Ambulatory Visit: Payer: Self-pay

## 2020-02-07 VITALS — BP 135/85 | HR 68 | Ht 75.0 in | Wt 185.0 lb

## 2020-02-07 DIAGNOSIS — N393 Stress incontinence (female) (male): Secondary | ICD-10-CM

## 2020-02-07 DIAGNOSIS — N529 Male erectile dysfunction, unspecified: Secondary | ICD-10-CM | POA: Diagnosis not present

## 2020-02-07 DIAGNOSIS — N138 Other obstructive and reflux uropathy: Secondary | ICD-10-CM

## 2020-02-07 DIAGNOSIS — N401 Enlarged prostate with lower urinary tract symptoms: Secondary | ICD-10-CM

## 2020-02-07 DIAGNOSIS — R3129 Other microscopic hematuria: Secondary | ICD-10-CM

## 2020-02-07 LAB — URINALYSIS, COMPLETE
Bilirubin, UA: NEGATIVE
Glucose, UA: NEGATIVE
Ketones, UA: NEGATIVE
Leukocytes,UA: NEGATIVE
Nitrite, UA: NEGATIVE
Protein,UA: NEGATIVE
Specific Gravity, UA: 1.025 (ref 1.005–1.030)
Urobilinogen, Ur: 0.2 mg/dL (ref 0.2–1.0)
pH, UA: 6.5 (ref 5.0–7.5)

## 2020-02-07 LAB — MICROSCOPIC EXAMINATION: Bacteria, UA: NONE SEEN

## 2020-02-24 ENCOUNTER — Ambulatory Visit (INDEPENDENT_AMBULATORY_CARE_PROVIDER_SITE_OTHER): Payer: Medicare PPO | Admitting: Family Medicine

## 2020-02-24 ENCOUNTER — Other Ambulatory Visit: Payer: Self-pay

## 2020-02-24 VITALS — BP 130/86 | HR 60 | Temp 97.9°F | Ht 75.0 in | Wt 199.6 lb

## 2020-02-24 DIAGNOSIS — Z136 Encounter for screening for cardiovascular disorders: Secondary | ICD-10-CM

## 2020-02-24 DIAGNOSIS — Z0000 Encounter for general adult medical examination without abnormal findings: Secondary | ICD-10-CM

## 2020-02-24 DIAGNOSIS — Z13228 Encounter for screening for other metabolic disorders: Secondary | ICD-10-CM | POA: Diagnosis not present

## 2020-02-24 DIAGNOSIS — Z23 Encounter for immunization: Secondary | ICD-10-CM

## 2020-02-24 NOTE — Progress Notes (Signed)
Annual Wellness Visit     Patient: Micheal Mccoy, Male    DOB: 1955/08/29, 65 y.o.   MRN: [id number] Visit Date: 02/24/2020  Today's Provider: Lelon Huh, MD   No chief complaint on file.  Subjective    Micheal Mccoy is a 65 y.o. male who presents today for his Annual Wellness Visit. He reports consuming a general diet. Home exercise routine includes treadmill and resistance training, home exercises. Gym/ health club routine includes cardio, mod to heavy weightlifting and stationary bike. He generally feels well. He reports sleeping fairly well. He does have additional problems to discuss today.   HPI      Medications: Outpatient Medications Prior to Visit  Medication Sig  . tadalafil (CIALIS) 20 MG tablet Take 1 tablet (20 mg total) by mouth daily. Or take every other day for ED   No facility-administered medications prior to visit.    Allergies  Allergen Reactions  . Aleve [Naproxen] Hives    Patient Care Team: [name], MD as PCP - General (Family Medicine) Kate Sable, MD as PCP - Cardiology (Cardiology) Bary Castilla Forest Gleason, MD as Consulting Physician (General Surgery) Deetta Perla, MD as Consulting Physician (Neurosurgery) Billey Co, MD as Consulting Physician (Urology) System, Provider Not In (Optometry) Throat, [place] (Audiology)  Review of Systems  Eyes: Positive for redness and itching.  Gastrointestinal: Positive for blood in stool.  Genitourinary:       Blood in urine but patient is getting a CT soon.  Neurological: Positive for light-headedness.      Objective    Vitals: BP 130/86   Pulse 60   Temp 97.9 F (36.6 C) (Temporal)   Ht 6\' 3"  (1.905 m)   Wt 199 lb 9.6 oz (90.5 kg)   BMI 24.95 kg/m    Physical Exam   General Appearance:    Well developed, well nourished male. Alert, cooperative, in no acute distress, appears stated age  Head:    Normocephalic, without obvious abnormality, atraumatic   Eyes:    PERRL, conjunctiva/corneas clear, EOM's intact, fundi    benign, both eyes       Ears:    Normal TM's and external ear canals, both ears  Neck:   Supple, symmetrical, trachea midline, no adenopathy;       thyroid:  No enlargement/tenderness/nodules; no carotid   bruit or JVD  Back:     Symmetric, no curvature, ROM normal, no CVA tenderness  Lungs:     Clear to auscultation bilaterally, respirations unlabored  Chest wall:    No tenderness or deformity  Heart:    Normal heart rate. Normal rhythm. No murmurs, rubs, or gallops.  S1 and S2 normal  Abdomen:     Soft, non-tender, bowel sounds active all four quadrants,    no masses, no organomegaly  Genitalia:    deferred  Rectal:    deferred  Extremities:   All extremities are intact. No cyanosis or edema  Pulses:   2+ and symmetric all extremities  Skin:   Skin color, texture, turgor normal, no rashes or lesions  Lymph nodes:   Cervical, supraclavicular, and axillary nodes normal  Neurologic:   CNII-XII intact. Normal strength, sensation and reflexes      throughout     Most recent functional status assessment: In your present state of health, do you have any difficulty performing the following activities: 12/26/2019  Hearing? N  Comment Wears bilateral hearing aid.  Vision?  N  Comment Wears eye glasses.  Difficulty concentrating or making decisions? N  Walking or climbing stairs? N  Dressing or bathing? N  Doing errands, shopping? N  Preparing Food and eating ? N  Using the Toilet? N  In the past six months, have you accidently leaked urine? N  Do you have problems with loss of bowel control? N  Managing your Medications? N  Managing your Finances? N  Housekeeping or managing your Housekeeping? N  Some recent data might be hidden   Most recent fall risk assessment: Fall Risk  12/26/2019  Falls in the past year? 0  Number falls in past yr: 0  Injury with Fall? 0  Follow up -    Most recent depression  screenings: PHQ 2/9 Scores 12/26/2019 11/16/2018  PHQ - 2 Score 0 3  PHQ- 9 Score - 5   Most recent cognitive screening: 6CIT Screen 12/26/2019  What Year? 0 points  What month? 0 points  What time? 0 points  Count back from 20 0 points  Months in reverse 0 points  Repeat phrase 0 points  Total Score 0   Most recent Audit-C alcohol use screening Alcohol Use Disorder Test (AUDIT) 12/26/2019  1. How often do you have a drink containing alcohol? 0  2. How many drinks containing alcohol do you have on a typical day when you are drinking? 0  3. How often do you have six or more drinks on one occasion? 0  AUDIT-C Score 0  Alcohol Brief Interventions/Follow-up AUDIT Score <7 follow-up not indicated   A score of 3 or more in women, and 4 or more in men indicates increased risk for alcohol abuse, EXCEPT if all of the points are from question 1   No results found for any visits on 02/24/20.  Assessment & Plan     Annual wellness visit done today including the all of the following: Reviewed patient's Family Medical History Reviewed and updated list of patient's medical providers Assessment of cognitive impairment was done Assessed patient's functional ability Established a written schedule for health screening [place] Completed and Reviewed  Exercise Activities and Dietary recommendations Goals    . DIET - EAT MORE FRUITS AND VEGETABLES     Recommend to eat more salads and more green leafy vegetables in daily diet.        Immunization History  Administered Date(s) Administered  . Fluad Quad(high Dose 65+) 11/16/2018  . Influenza Inj Mdck Quad Pf 12/26/2019  . Influenza,inj,Quad PF,6+ Mos 12/28/2015  . PFIZER(Purple Top)SARS-COV-2 Vaccination 02/26/2019, 03/28/2019, 10/04/2019  . PPD Test 07/19/2019  . Pneumococcal Polysaccharide-23 11/16/2018  . Tdap 03/22/2013  . Zoster Recombinat (Shingrix) 11/16/2018    Health Maintenance  Topic Date Due  .  PNA vac Low Risk Adult (2 of 2 - PCV13) 11/16/2019  . TETANUS/TDAP  03/23/2023  . COLONOSCOPY (Pts 45-87yrs Insurance coverage will need to be confirmed)  01/31/2027  . INFLUENZA VACCINE  Completed  . COVID-19 Vaccine  Completed  . Hepatitis C Screening  Completed     Discussed health benefits of physical activity, and encouraged him to engage in regular exercise appropriate for his age and condition.    1. Annual physical exam   2. Encounter for special screening examination for cardiovascular disorder  - Lipid panel  3. Screening for metabolic disorder  - Basic metabolic panel  4. Need for vaccination against Streptococcus pneumoniae  - Pneumococcal conjugate vaccine 13-valent IM  5. Need for  shingles vaccine  - Administer Zoster, Recombinant (Shingrix) Vaccine #2       The entirety of the information documented in the History of Present Illness, Review of Systems and Physical Exam were personally obtained by me. Portions of this information were initially documented by the CMA and reviewed by me for thoroughness and accuracy.      Lelon Huh, MD  [facility] [phone] (phone) [phone] (fax)  [place]

## 2020-02-24 NOTE — Patient Instructions (Addendum)
. Please review the attached list of medications and notify my office if there are any errors.    DASH Eating Plan DASH stands for Dietary Approaches to Stop Hypertension. The DASH eating plan is a healthy eating plan that has been shown to:  Reduce high blood pressure (hypertension).  Reduce your risk for type 2 diabetes, heart disease, and stroke.  Help with weight loss. What are tips for following this plan? Reading food labels  Check food labels for the amount of salt (sodium) per serving. Choose foods with less than 5 percent of the Daily Value of sodium. Generally, foods with less than 300 milligrams (mg) of sodium per serving fit into this eating plan.  To find whole grains, look for the word "whole" as the first word in the ingredient list. Shopping  Buy products labeled as "low-sodium" or "no salt added."  Buy fresh foods. Avoid canned foods and pre-made or frozen meals. Cooking  Avoid adding salt when cooking. Use salt-free seasonings or herbs instead of table salt or sea salt. Check with your health care provider or pharmacist before using salt substitutes.  Do not fry foods. Cook foods using healthy methods such as baking, boiling, grilling, roasting, and broiling instead.  Cook with heart-healthy oils, such as olive, canola, avocado, soybean, or sunflower oil. Meal planning  Eat a balanced diet that includes: ? 4 or more servings of fruits and 4 or more servings of vegetables each day. Try to fill one-half of your plate with fruits and vegetables. ? 6-8 servings of whole grains each day. ? Less than 6 oz (170 g) of lean meat, poultry, or fish each day. A 3-oz (85-g) serving of meat is about the same size as a deck of cards. One egg equals 1 oz (28 g). ? 2-3 servings of low-fat dairy each day. One serving is 1 cup (237 mL). ? 1 serving of nuts, seeds, or beans 5 times each week. ? 2-3 servings of heart-healthy fats. Healthy fats called omega-3 fatty acids are found in  foods such as walnuts, flaxseeds, fortified milks, and eggs. These fats are also found in cold-water fish, such as sardines, salmon, and mackerel.  Limit how much you eat of: ? Canned or prepackaged foods. ? Food that is high in trans fat, such as some fried foods. ? Food that is high in saturated fat, such as fatty meat. ? Desserts and other sweets, sugary drinks, and other foods with added sugar. ? Full-fat dairy products.  Do not salt foods before eating.  Do not eat more than 4 egg yolks a week.  Try to eat at least 2 vegetarian meals a week.  Eat more home-cooked food and less restaurant, buffet, and fast food.   Lifestyle  When eating at a restaurant, ask that your food be prepared with less salt or no salt, if possible.  If you drink alcohol: ? Limit how much you use to:  0-1 drink a day for women who are not pregnant.  0-2 drinks a day for men. ? Be aware of how much alcohol is in your drink. In the [place] one drink equals one 12 oz bottle of beer (355 mL), one 5 oz glass of wine (148 mL), or one 1 oz glass of hard liquor (44 mL). General information  Avoid eating more than 2,300 mg of salt a day. If you have hypertension, you may need to reduce your sodium intake to 1,500 mg a day.  Work with your health care  provider to maintain a healthy body weight or to lose weight. Ask what an ideal weight is for you.  Get at least 30 minutes of exercise that causes your heart to beat faster (aerobic exercise) most days of the week. Activities may include walking, swimming, or biking.  Work with your health care provider or dietitian to adjust your eating plan to your individual calorie needs. What foods should I eat? Fruits All fresh, dried, or frozen fruit. Canned fruit in natural juice (without added sugar). Vegetables Fresh or frozen vegetables (raw, steamed, roasted, or grilled). Low-sodium or reduced-sodium tomato and vegetable juice. Low-sodium or reduced-sodium tomato  sauce and tomato paste. Low-sodium or reduced-sodium canned vegetables. Grains Whole-grain or whole-wheat bread. Whole-grain or whole-wheat pasta. Brown rice. [place]. Bulgur. Whole-grain and low-sodium cereals. Pita bread. Low-fat, low-sodium crackers. Whole-wheat flour tortillas. Meats and other proteins Skinless chicken or [place]. Ground chicken or [place]. Pork with fat trimmed off. Fish and seafood. Egg whites. Dried beans, peas, or lentils. Unsalted nuts, nut butters, and seeds. Unsalted canned beans. Lean cuts of beef with fat trimmed off. Low-sodium, lean precooked or cured meat, such as sausages or meat loaves. Dairy Low-fat (1%) or fat-free (skim) milk. Reduced-fat, low-fat, or fat-free cheeses. Nonfat, low-sodium ricotta or cottage cheese. Low-fat or nonfat yogurt. Low-fat, low-sodium cheese. Fats and oils Soft margarine without trans fats. Vegetable oil. Reduced-fat, low-fat, or light mayonnaise and salad dressings (reduced-sodium). Canola, safflower, olive, avocado, soybean, and sunflower oils. Avocado. Seasonings and condiments Herbs. Spices. Seasoning mixes without salt. Other foods Unsalted popcorn and pretzels. Fat-free sweets. The items listed above may not be a complete list of foods and beverages you can eat. Contact a dietitian for more information. What foods should I avoid? Fruits Canned fruit in a light or heavy syrup. Fried fruit. Fruit in cream or butter sauce. Vegetables Creamed or fried vegetables. Vegetables in a cheese sauce. Regular canned vegetables (not low-sodium or reduced-sodium). Regular canned tomato sauce and paste (not low-sodium or reduced-sodium). Regular tomato and vegetable juice (not low-sodium or reduced-sodium). Angie Fava. Olives. Grains Baked goods made with fat, such as croissants, muffins, or some breads. Dry pasta or rice meal packs. Meats and other proteins Fatty cuts of meat. Ribs. Fried meat. Berniece Salines. Bologna, salami, and other precooked  or cured meats, such as sausages or meat loaves. Fat from the back of a pig (fatback). Bratwurst. Salted nuts and seeds. Canned beans with added salt. Canned or smoked fish. Whole eggs or egg yolks. Chicken or [place] with skin. Dairy Whole or 2% milk, cream, and half-and-half. Whole or full-fat cream cheese. Whole-fat or sweetened yogurt. Full-fat cheese. Nondairy creamers. Whipped toppings. Processed cheese and cheese spreads. Fats and oils Butter. Stick margarine. Lard. Shortening. Ghee. Bacon fat. Tropical oils, such as coconut, palm kernel, or palm oil. Seasonings and condiments Onion salt, garlic salt, seasoned salt, table salt, and sea salt. Worcestershire sauce. Tartar sauce. Barbecue sauce. Teriyaki sauce. Soy sauce, including reduced-sodium. Steak sauce. Canned and packaged gravies. Fish sauce. Oyster sauce. Cocktail sauce. Store-bought horseradish. Ketchup. Mustard. Meat flavorings and tenderizers. Bouillon cubes. Hot sauces. Pre-made or packaged marinades. Pre-made or packaged taco seasonings. Relishes. Regular salad dressings. Other foods Salted popcorn and pretzels. The items listed above may not be a complete list of foods and beverages you should avoid. Contact a dietitian for more information. Where to find more information  National Heart, Lung, and Blood Institute: https://wilson-eaton.com/  American Heart Association: www.heart.org  Academy of Nutrition and Dietetics: www.eatright.org  National Kidney  Foundation: www.kidney.org Summary  The DASH eating plan is a healthy eating plan that has been shown to reduce high blood pressure (hypertension). It may also reduce your risk for type 2 diabetes, heart disease, and stroke.  When on the DASH eating plan, aim to eat more fresh fruits and vegetables, whole grains, lean proteins, low-fat dairy, and heart-healthy fats.  With the DASH eating plan, you should limit salt (sodium) intake to 2,300 mg a day. If you have hypertension, you may  need to reduce your sodium intake to 1,500 mg a day.  Work with your health care provider or dietitian to adjust your eating plan to your individual calorie needs. This information is not intended to replace advice given to you by your health care provider. Make sure you discuss any questions you have with your health care provider. Document Revised: 11/26/2018 Document Reviewed: 11/26/2018 Elsevier Patient Education  2021 [employer].

## 2020-02-25 LAB — BASIC METABOLIC PANEL
BUN/Creatinine Ratio: 8 — ABNORMAL LOW (ref 10–24)
BUN: 9 mg/dL (ref 8–27)
CO2: 23 mmol/L (ref 20–29)
Calcium: 9.3 mg/dL (ref 8.6–10.2)
Chloride: 102 mmol/L (ref 96–106)
Creatinine, Ser: 1.07 mg/dL (ref 0.76–1.27)
GFR calc Af Amer: 83 mL/min/{1.73_m2} (ref >59)
GFR calc non Af Amer: 72 mL/min/{1.73_m2} (ref >59)
Glucose: 89 mg/dL (ref 65–99)
Potassium: 4.3 mmol/L (ref 3.5–5.2)
Sodium: 141 mmol/L (ref 134–144)

## 2020-02-25 LAB — LIPID PANEL
Chol/HDL Ratio: 3 ratio (ref 0.0–5.0)
Cholesterol, Total: 176 mg/dL (ref 100–199)
HDL: 59 mg/dL (ref >39)
LDL Chol Calc (NIH): 107 mg/dL — ABNORMAL HIGH (ref 0–99)
Triglycerides: 50 mg/dL (ref 0–149)
VLDL Cholesterol Cal: 10 mg/dL (ref 5–40)

## 2020-03-02 ENCOUNTER — Ambulatory Visit
Admission: RE | Admit: 2020-03-02 | Discharge: 2020-03-02 | Disposition: A | Discharge status: Home / Self Care | Payer: Medicare PPO | Source: Ambulatory Visit | Attending: Urology | Admitting: Urology

## 2020-03-02 ENCOUNTER — Other Ambulatory Visit: Payer: Self-pay

## 2020-03-02 DIAGNOSIS — R3129 Other microscopic hematuria: Secondary | ICD-10-CM | POA: Diagnosis not present

## 2020-03-02 MED ORDER — IOHEXOL 300 MG/ML  SOLN
125.0000 mL | Freq: Once | INTRAMUSCULAR | Status: AC | PRN
  Administered 2020-03-02: 125 mL via INTRAVENOUS

## 2020-03-06 NOTE — Progress Notes (Signed)
03/07/2020 10:16 AM   Micheal Mccoy 1955-07-04 [id number]  Referring provider: [name], MD [address] [place],  [place] [address]  Chief Complaint  Patient presents with  . Results   Urological history 1. Peyronie's disease - s/p plication 0175  2. BPH with LU TS - I PSS 5/5 - PVR 0 mL - retrograde ejaculation with Rapaflo  - s/p HoLEP 02/2019  3. ED - SHIM 12 - 878 on 12/12/2019 - contributing factors of age, BPH, relationship issues and Peyronie's disease - managed with tadalafil 20 mg, daily   4. High risk hematuria - non smoker - CTU 2017 - showed 130 gm prostate. It also showed a benign Bosniak 2 left upper pole cyst - cysto 2017 Enlarged prostate - 6 cm in length. Visually obstructive and intravesical lobe of prostate - cysto during HoLEP 02/2019 no suspicious lesions - UA 3-10 RBC's   5. Elevated PSA - PSA Trend Component     Latest Ref Rng & Units 05/23/2015 11/22/2015 05/21/2016 11/17/2016  Prostate Specific Ag, Serum     0.0 - 4.0 ng/mL 2.8 2.3 3.3 4.3 (H)  Reflex Criteria            Component     Latest Ref Rng & Units 05/21/2017 11/20/2017 11/16/2018 11/30/2018  Prostate Specific Ag, Serum     0.0 - 4.0 ng/mL 3.9 3.1 5.5 (H) 7.0 (H)  Reflex Criteria        Comment    Component     Latest Ref Rng & Units 12/12/2019  Prostate Specific Ag, Serum     0.0 - 4.0 ng/mL 1.6  Reflex Criteria      Comment   Prostate biopsy iPSA 4.2 - negative  Prostate MRI 12/2018 for PSA 7.  No findings suspicious for macroscopic prostate cancer.  PI-RADS 1.  BPH.  Calculated prostate volume 87 mL Benign tissue with HoLEP 02/2019  HPI: Micheal Mccoy is a 65 y.o. male who presents today for CT urogram results.  CT urogram 03/02/2020 - bilateral renal cysts   SUI - referral placed for PT - has upcoming appointment  He has not complaints at this visit.    PMH: Past Medical History:  Diagnosis Date  . Arthritis   . Benign neoplasm of  ascending colon   . Degenerative disc disease, cervical   . Esophagitis, reflux 09/29/2005  . Frequency of urination   . GERD (gastroesophageal reflux disease)   . Peyronie's disease    W/ PENILE CURVATURE    Surgical History: Past Surgical History:  Procedure Laterality Date  . ANTERIOR CERVICAL DECOMP/DISCECTOMY FUSION N/A 01/25/2018   Procedure: ANTERIOR CERVICAL DECOMPRESSION/DISCECTOMY FUSION 1 LEVEL C7-T1, T1-T2;  Surgeon: Deetta Perla, MD;  Location: [facility];  Service: Neurosurgery;  Laterality: N/A;  . ANTERIOR CERVICAL DISCECTOMY  01/25/2018   Dr. Lacinda Axon, ACDF ARTHRODESIS, OSTEOPHYTECTOMY &DECOMPRESSION OF SPINAL CORD AND/ORNERVE ROOTS; BELOW C2--C7-T1  . COLONOSCOPY WITH PROPOFOL N/A 09/14/2014   Procedure: COLONOSCOPY WITH PROPOFOL;  Surgeon: Lucilla Lame, MD;  Location: [place];  Service: Endoscopy;  Laterality: N/A;  . COLONOSCOPY WITH PROPOFOL N/A 01/31/2020   Procedure: COLONOSCOPY WITH PROPOFOL;  Surgeon: Lucilla Lame, MD;  Location: [facility] END[facility]COPY;  Service: Endoscopy;  Laterality: N/A;  . HERNIA REPAIR Bilateral 04/09/2015   Bard 3D Max mesh for bilateral inguinal hernias, primary repair of umbilical henria.   Marland Kitchen HOLEP-LASER ENUCLEATION OF THE PROSTATE WITH MORCELLATION N/A 02/17/2019   Procedure: HOLEP-LASER ENUCLEATION OF THE  PROSTATE WITH MORCELLATION;  Surgeon: Billey Co, MD;  Location: [facility];  Service: Urology;  Laterality: N/A;  . INGUINAL HERNIA REPAIR Bilateral 04/09/2015   Procedure: LAPAROSCOPIC INGUINAL HERNIA;  Surgeon: Robert Bellow, MD;  Location: [facility];  Service: General;  Laterality: Bilateral;  . NESBIT PROCEDURE N/A 06/28/2012   Procedure: 16 DOT PLACTATION PROCEDURE;  Surgeon: Claybon Jabs, MD;  Location: [facility];  Service: Urology;  Laterality: N/A;  . Peyronies Disease  06/2012  . POLYPECTOMY  09/14/2014   Procedure: POLYPECTOMY;  Surgeon: Lucilla Lame, MD;  Location: [place];  Service: Endoscopy;;   . PROSTATE BIOPSY  03-02-15   neg  . THORACIC DISCECTOMY  01/25/2018   Dr. Lacinda Axon, ARTHRODESIS ANTERIOR INTERBODY W/DISCECTOMY ADDITIONAL LEVEL T1-T2  . UMBILICAL HERNIA REPAIR N/A 04/09/2015   Procedure: HERNIA REPAIR UMBILICAL ADULT;  Surgeon: Robert Bellow, MD;  Location: [facility];  Service: General;  Laterality: N/A;    Home Medications:  Allergies as of 03/07/2020      Reactions   Aleve [naproxen] Hives      Medication List       Accurate as of March 07, 2020 10:16 AM. If you have any questions, ask your nurse or doctor.        tadalafil 20 MG tablet Commonly known as: CIALIS Take 1 tablet (20 mg total) by mouth daily. Or take every other day for ED       Allergies:  Allergies  Allergen Reactions  . Aleve [Naproxen] Hives    Family History: Family History  Problem Relation Age of Onset  . Prostate cancer Neg Hx   . Bladder Cancer Neg Hx   . Kidney cancer Neg Hx     Social History:  reports that he has never smoked. He has never used smokeless tobacco. He reports that he does not drink alcohol and does not use drugs.  ROS: Pertinent ROS in HPI  Physical Exam: BP 134/76   Pulse 66   Ht 6\' 3"  (1.905 m)   Wt 197 lb (89.4 kg)   BMI 24.62 kg/m   Constitutional:  Well nourished. Alert and oriented, No acute distress. HEENT: [place] AT, mask in place.  Trachea midline Cardiovascular: No clubbing, cyanosis, or edema. Respiratory: Normal respiratory effort, no increased work of breathing. Neurologic: Grossly intact, no focal deficits, moving all 4 extremities. Psychiatric: Normal mood and affect.   Laboratory Data: Lab Results  Component Value Date   CREATININE 1.07 02/24/2020  I have reviewed the labs.   Pertinent Imaging: CLINICAL DATA:  65 year old male with history of microscopic hematuria.  EXAM: CT ABDOMEN AND PELVIS WITHOUT AND WITH CONTRAST  TECHNIQUE: Multidetector CT imaging of the abdomen and pelvis was performed following the standard  protocol before and following the bolus administration of intravenous contrast.  CONTRAST:  166mL OMNIPAQUE IOHEXOL 300 MG/ML  SOLN  COMPARISON:  CT the abdomen and pelvis 02/02/2015.  FINDINGS: Lower chest: Tiny 2-3 mm pulmonary nodules in the lung bases, nonspecific, but statistically likely benign. Aortic atherosclerosis.  Hepatobiliary: Multiple small well-defined low-attenuation lesions in the liver, largest of which are compatible with simple cysts, measuring up to 1.2 cm in segment 6. In addition, between segments 3 and 4A (axial image 15 of series 9) there is a 2.0 x 1.2 cm intermediate attenuation lesion which is similar in size and appearance dating back to prior study from 02/02/2015, presumably a benign lesions such as a benign perfusion anomaly or focal fatty  infiltration. No other suspicious hepatic lesions are noted. No intra or extrahepatic biliary ductal dilatation. Gallbladder is normal in appearance.  Pancreas: No pancreatic mass. No pancreatic ductal dilatation. No pancreatic or peripancreatic fluid collections or inflammatory changes.  Spleen: Unremarkable.  Adrenals/Urinary Tract: No calcifications are identified within the collecting system of either kidney, along the course of either ureter, or within the lumen of the urinary bladder. Low-attenuation lesions in the left kidney, compatible with simple cysts, measuring up to 3.7 cm in diameter in the upper pole. Other subcentimeter low-attenuation lesions in both kidneys, too small to characterize, but statistically likely to represent tiny cysts. In the upper pole of the left kidney there is also an exophytic 1.4 cm high attenuation lesion (69 HU), compatible with a proteinaceous/hemorrhagic cyst. No hydroureteronephrosis. Postcontrast delayed images demonstrate no definite filling defect within the collecting system of either kidney, along the course of either ureter, or within the lumen of the  urinary bladder to suggest the presence of urothelial neoplasm at this time. Urinary bladder is normal in appearance. Bilateral adrenal glands are normal in appearance.  Stomach/Bowel: Normal appearance of the stomach. No pathologic dilatation of small bowel or colon. Normal appendix.  Vascular/Lymphatic: Aortic atherosclerosis, without evidence of aneurysm or dissection in the abdominal or pelvic vasculature. No lymphadenopathy noted in the abdomen or pelvis.  Reproductive: Prostate gland and seminal vesicles are unremarkable in appearance.  Other: No significant volume of ascites.  No pneumoperitoneum.  Musculoskeletal: There are no aggressive appearing lytic or blastic lesions noted in the visualized portions of the skeleton.  IMPRESSION: 1. No definite source for microscopic hematuria identified on today's examination. 2. Multiple renal lesions bilaterally, largest of which are classified as either simple (Bosniak class 1), or proteinaceous/hemorrhagic (Bosniak class 2) cysts. Smaller lesions are too small to definitively characterize, but also favored to represent tiny cysts. Should the patient's hematuria persist or worsen, definitive characterization with abdominal MRI with and without IV gadolinium could be considered. 3. Tiny 2-3 mm pulmonary nodules in the lung bases bilaterally, nonspecific, but statistically likely benign. No follow-up needed if patient is low-risk (and has no known or suspected primary neoplasm). Non-contrast chest CT can be considered in 12 months if patient is high-risk. This recommendation follows the consensus statement: Guidelines for Management of Incidental Pulmonary Nodules Detected on CT Images: From the Fleischner Society 2017; Radiology 2017; 284:228-243. 4. Aortic atherosclerosis. 5. Additional incidental findings, as above.   Electronically Signed   By: Vinnie Langton M.D.   On: 03/03/2020 14:49 I have independently  reviewed the films with the patient.  See HPI.   Assessment & Plan:    1. Erectile dysfunction - Cialis 20 mg every other day is effective for satisfactory erections  2. SUI -has an upcoming appointment with pelvic floor physical therapy  3. Microscopic hematuria - cysto completed last year NED - CTU bilateral cysts  4. BPH  - s/p HoLEP 02/2019  Return in about 6 months (around 09/07/2020) for PSA, UA, I PSS, SHIM and exam .  These notes generated with voice recognition software. I apologize for typographical errors.  Zara Council, PA-C  [facility] [address]  [place], [place] [address] [phone]  I spent 15 minutes on the day of the encounter to include pre-visit record review, face-to-face time with the patient, and post-visit ordering of tests.

## 2020-03-07 ENCOUNTER — Encounter: Payer: Self-pay | Admitting: Urology

## 2020-03-07 ENCOUNTER — Ambulatory Visit (INDEPENDENT_AMBULATORY_CARE_PROVIDER_SITE_OTHER): Payer: Medicare PPO | Admitting: Urology

## 2020-03-07 ENCOUNTER — Other Ambulatory Visit: Payer: Self-pay

## 2020-03-07 VITALS — BP 134/76 | HR 66 | Ht 75.0 in | Wt 197.0 lb

## 2020-03-07 DIAGNOSIS — N529 Male erectile dysfunction, unspecified: Secondary | ICD-10-CM

## 2020-03-07 DIAGNOSIS — N281 Cyst of kidney, acquired: Secondary | ICD-10-CM | POA: Diagnosis not present

## 2020-03-07 DIAGNOSIS — R3129 Other microscopic hematuria: Secondary | ICD-10-CM | POA: Diagnosis not present

## 2020-03-07 DIAGNOSIS — N393 Stress incontinence (female) (male): Secondary | ICD-10-CM

## 2020-03-30 ENCOUNTER — Other Ambulatory Visit: Payer: Self-pay

## 2020-03-30 ENCOUNTER — Encounter: Payer: Self-pay | Admitting: Family Medicine

## 2020-03-30 ENCOUNTER — Ambulatory Visit (INDEPENDENT_AMBULATORY_CARE_PROVIDER_SITE_OTHER): Payer: Medicare PPO | Admitting: Family Medicine

## 2020-03-30 VITALS — BP 133/89 | HR 61 | Temp 98.0°F | Resp 16 | Ht 75.0 in | Wt 198.0 lb

## 2020-03-30 DIAGNOSIS — K921 Melena: Secondary | ICD-10-CM | POA: Diagnosis not present

## 2020-03-30 NOTE — Progress Notes (Signed)
      Established patient visit   Patient: Micheal Mccoy   DOB: 05-10-1955   65 y.o. Male  MRN: [id number] Visit Date: 03/30/2020  Today's healthcare provider: Lelon Huh, MD   Chief Complaint  Patient presents with  . Blood In Stools   Subjective    HPI  Patient presents today c/o blood in stools. He reports that he has had 3 bloody bowel movements in the last month. Last episode was about 10 days ago. He reports that he has had this before about 1 year ago. His last colonoscopy was on 01/28/2020 and it was normal. He is due to repeat in 100yrs. He does not have any family history of colon cancer. He does have history of colon polyps. He denies any nausea, vomiting, and diarrhea. There is no pain with BM but does have to strain to have BM from time to time. He typically has BM every day. Denies consuming any red foods or red dyes such as beats or red-velvet cake.       Medications: Outpatient Medications Prior to Visit  Medication Sig  . tadalafil (CIALIS) 20 MG tablet Take 1 tablet (20 mg total) by mouth daily. Or take every other day for ED   No facility-administered medications prior to visit.    Review of Systems  Constitutional: Negative for activity change and fatigue.  Gastrointestinal: Positive for anal bleeding and blood in stool. Negative for abdominal distention, abdominal pain, constipation, diarrhea, nausea, rectal pain and vomiting.  Genitourinary: Negative.        Objective    BP 133/89   Pulse 61   Temp 98 F (36.7 C)   Resp 16   Ht 6\' 3"  (1.905 m)   Wt 198 lb (89.8 kg)   BMI 24.75 kg/m     Physical Exam   Rectal exam: no external or internal lesions palpated with digital exam. Hemoccult negative.    Assessment & Plan     1. Blood in stool Recent colonoscopy was completely normal. Negative hemoccult today. No rectal lesions on digital exam. Likely related to occasional hard stools and recommended he start taking daily fiber supplemental.  Specifically recommended Metamucil.        The entirety of the information documented in the History of Present Illness, Review of Systems and Physical Exam were personally obtained by me. Portions of this information were initially documented by the CMA and reviewed by me for thoroughness and accuracy.      Lelon Huh, MD  [facility] [phone] (phone) [phone] (fax)  [place]

## 2020-04-02 ENCOUNTER — Other Ambulatory Visit: Payer: Self-pay

## 2020-04-02 ENCOUNTER — Ambulatory Visit: Payer: Medicare PPO | Attending: Urology | Admitting: Physical Therapy

## 2020-04-02 ENCOUNTER — Encounter: Payer: Self-pay | Admitting: Physical Therapy

## 2020-04-02 DIAGNOSIS — M4126 Other idiopathic scoliosis, lumbar region: Secondary | ICD-10-CM | POA: Diagnosis not present

## 2020-04-02 DIAGNOSIS — R278 Other lack of coordination: Secondary | ICD-10-CM

## 2020-04-02 DIAGNOSIS — M62838 Other muscle spasm: Secondary | ICD-10-CM

## 2020-04-02 NOTE — Patient Instructions (Signed)
  Avoid straining pelvic floor, abdominal muscles , spine  Use log rolling technique instead of getting out of bed with your neck or the sit-up     Log rolling into and out of bed   Log rolling into and out of bed If getting out of bed on R side, Bent knees, scoot hips/ shoulder to L  Raise R arm completely overhead, rolling onto armpit  Then lower bent knees to bed to get into complete side lying position  Then drop legs off bed, and push up onto R elbow/forearm, and use L hand to push onto the bed   __   Proper body mechanics with getting out of a chair to decrease strain  on back &pelvic floor   Avoid holding your breath when Getting out of the chair:  Scoot to front part of chair chair Heels behind knees, feet are hip width apart, nose over toes  Inhale like you are smelling roses Exhale to stand    ___   Clam [place] 45 Degrees  Lying with hips and knees bent 45, one pillow between knees and ankles. Heel together, toes apart like ballerina,  Lift knee with exhale while pressing heels together. Be sure pelvis does not roll backward. Do not arch back. Do 20 times, each leg, 2 times per day.     Complimentary stretch: Figure-4 with R ankle over L    Scoot hips to R and then   Cross _R  foot over L thigh  Scoot hips to R and then pull thigh up with L hand   3 breaths  * Keep pelvis levelled with tactile cue with hand under back of hips  * Slide the ankle of the supporting foot out to decrease the angle which can help level the pelvis     Cross _ ankle overopposite thigh, opposite knee bent, foot on bed ( Figure-4)  exhale to hug the thighs in with arms pulling back of thigh, shoulders/ head is relaxed down , Use towel behind thigh is needed.  10 reps

## 2020-04-03 NOTE — Therapy (Signed)
[place] MAIN [facility] SERVICES [address] [place], [place], [address] Phone: [phone]   Fax:  [phone]  Physical Therapy Evaluation   Patient Details  Name: Micheal Mccoy MRN: [id number] Date of Birth: 1955/06/27 Referring Provider (PT): Zara Council   Encounter Date: 04/02/2020   PT End of Session - 04/02/20 1703    Visit Number 1    Number of Visits 10    Date for PT Re-Evaluation 06/11/20    PT Start Time 1703    PT Stop Time 1800    PT Time Calculation (min) 57 min           Past Medical History:  Diagnosis Date  . Arthritis   . Benign neoplasm of ascending colon   . Degenerative disc disease, cervical   . Esophagitis, reflux 09/29/2005  . Frequency of urination   . GERD (gastroesophageal reflux disease)   . Peyronie's disease    W/ PENILE CURVATURE    Past Surgical History:  Procedure Laterality Date  . ANTERIOR CERVICAL DECOMP/DISCECTOMY FUSION N/A 01/25/2018   Procedure: ANTERIOR CERVICAL DECOMPRESSION/DISCECTOMY FUSION 1 LEVEL C7-T1, T1-T2;  Surgeon: Deetta Perla, MD;  Location: [facility];  Service: Neurosurgery;  Laterality: N/A;  . ANTERIOR CERVICAL DISCECTOMY  01/25/2018   Dr. Lacinda Axon, ACDF ARTHRODESIS, OSTEOPHYTECTOMY &DECOMPRESSION OF SPINAL CORD AND/ORNERVE ROOTS; BELOW C2--C7-T1  . COLONOSCOPY WITH PROPOFOL N/A 09/14/2014   Procedure: COLONOSCOPY WITH PROPOFOL;  Surgeon: Lucilla Lame, MD;  Location: [place];  Service: Endoscopy;  Laterality: N/A;  . COLONOSCOPY WITH PROPOFOL N/A 01/31/2020   Procedure: COLONOSCOPY WITH PROPOFOL;  Surgeon: Lucilla Lame, MD;  Location: [facility] END[facility]COPY;  Service: Endoscopy;  Laterality: N/A;  . HERNIA REPAIR Bilateral 04/09/2015   Bard 3D Max mesh for bilateral inguinal hernias, primary repair of umbilical henria.   Marland Kitchen HOLEP-LASER ENUCLEATION OF THE PROSTATE WITH MORCELLATION N/A 02/17/2019   Procedure: HOLEP-LASER ENUCLEATION OF THE PROSTATE WITH MORCELLATION;  Surgeon:  Billey Co, MD;  Location: [facility];  Service: Urology;  Laterality: N/A;  . INGUINAL HERNIA REPAIR Bilateral 04/09/2015   Procedure: LAPAROSCOPIC INGUINAL HERNIA;  Surgeon: Robert Bellow, MD;  Location: [facility];  Service: General;  Laterality: Bilateral;  . NESBIT PROCEDURE N/A 06/28/2012   Procedure: 16 DOT PLACTATION PROCEDURE;  Surgeon: Claybon Jabs, MD;  Location: [facility];  Service: Urology;  Laterality: N/A;  . Peyronies Disease  06/2012  . POLYPECTOMY  09/14/2014   Procedure: POLYPECTOMY;  Surgeon: Lucilla Lame, MD;  Location: [place];  Service: Endoscopy;;  . PROSTATE BIOPSY  03-02-15   neg  . THORACIC DISCECTOMY  01/25/2018   Dr. Lacinda Axon, ARTHRODESIS ANTERIOR INTERBODY W/DISCECTOMY ADDITIONAL LEVEL T1-T2  . UMBILICAL HERNIA REPAIR N/A 04/09/2015   Procedure: HERNIA REPAIR UMBILICAL ADULT;  Surgeon: Robert Bellow, MD;  Location: [facility];  Service: General;  Laterality: N/A;    There were no vitals filed for this visit.   Subjective Assessment - 04/02/20 1707    Subjective Pt had an enlarged prostate and had HOLEP surgery on 02/17/2019.  Pt now experiences urinary leakage            ( droplets)  with walking , taking shower, doing ab exercises, weight lifting.  Pt no longer wears pads.  Pt has urge to urinate again after urination.  Nocturia decreased to 1-2 x night after HOLEP.  Neg colonscopy findings. Pt strains with bowel movements across 50% of the time .  Pt works  out 4+ week with crucnhes and sit ups, abs. Hx of hernias at umbilicus, inguinal hernia. DDD, cervical fusion.              [facility] PT Assessment - 04/03/20 1427      Assessment   Medical Diagnosis SUI    Referring Provider (PT) Zara Council      Precautions   Precautions None      Restrictions   Weight Bearing Restrictions No      Balance Screen   Has the patient fallen in the past 6 months No      Observation/Other Assessments   Scoliosis L lumbar curve convex       Coordination   Gross Motor Movements are Fluid and Coordinated --   chest breathing     Other:   Other/ Comments leg lift with extensor mm overuse, abdominal bulging      Strength   Overall Strength Comments hip flexion R 4-/5, L 5/5 , B knee flexion/ ext 5/5, hip ext/ abd R 4-/5, L 5/5      Palpation   Spinal mobility WFL all directions    SI assessment  leveled pelvic girdle    Palpation comment umbilical scar restrictions                      Pelvic Floor Special Questions - 04/03/20 1452    Diastasis Recti neg    External Perineal Exam through clothing , tightness of R pelvic floor mm, ab straining with cue for contraction             [facility] Adult PT Treatment/Exercise - 04/03/20 1453      Therapeutic Activites    Other Therapeutic Activities cued for modification to leg lift  with short level and less hip flexion to minimize arching of lumbar and abdominal straining      Neuro Re-ed    Neuro Re-ed Details  cued for body mechanics to minimize straining ab and pelvic floor                       PT Long Term Goals - 04/02/20 1716      PT LONG TERM GOAL #1   Title Pt will demo proper coordination with pelvic floor and no overuse of abs  to minimize leakage with walking, weight lifting and minimize risk for worsening of hernia    Time 4    Period Weeks    Status New    Target Date 04/30/20      PT LONG TERM GOAL #2   Title Pt will report decreased straining with bowel movements from across 50% of the time to less than 25% of the time in order to minimzie blood in stools    Time 6    Period Weeks    Status New    Target Date 05/14/20      PT LONG TERM GOAL #3   Title Pt will demo proper technique with his fitness routine to minimize straining of pelvic floor mm and decrease leakage    Time 8    Period Weeks    Status New    Target Date 05/28/20      PT LONG TERM GOAL #4   Title Pt will increase his FOTO score for urinary from 54 pts  to > 60 pts in order to improve QOL    Time 8    Period Weeks    Status New  Target Date 05/29/20      PT LONG TERM GOAL #5   Title Pt will demo decreased mm tightnes on R pelvic floor mm and increased RLE strength in order to progress to kegel strengthening exercises with greater outcomes    Time 6    Period Weeks    Status New    Target Date 05/15/20                 Plan - 04/03/20 1209    Clinical Impression Statement  Pt is a  65  yo  who presents with SUI 2/2  HOLEP surgery on 02/17/2019. Pt no longer has to wear pads but is still experiencing urinary leakage of droplets  with walking , taking shower, doing ab exercises, weight lifting.   Pt's musculoskeletal assessment revealed lumbar convex curve to spine, uneven shoulder height, limited spinal /pelvic mobility, dyscoordination and strength of pelvic floor mm, weak hip weakness on RLE, B hip abduction strength,  poor body mechanics which places strain on the abdominal/pelvic floor mm. These are deficits that indicate an ineffective intraabdominal pressure system associated with increased risk for pt's Sx.    Pt performs many gravity-loaded tasks at work and home. Pt will benefit from proper coordination training and education on fitness and functional positions in order to yield greater outcomes as pt performed sit-ups/ crunches in the past. Advised pt to not perform sit-ups and crunches as these movement patterns lead to more downward forces on the pelvic floor, negatively impacting abdominopelvic/spinal dysfunctions.   Pt was provided education on etiology of Sx with anatomy, physiology explanation with images along with the benefits of customized pelvic PT Tx based on pt's medical conditions and musculoskeletal deficits.  Explained the physiology of deep core mm coordination and roles of pelvic floor function in urination, defecation, sexual function, and postural control with deep core mm system.   Regional interdependent  approaches will yield greater benefits in pt's POC due to the complexity of pt's medical Hx and the significant impact their Sx have had on their QOL.   Following Tx today which pt tolerated without complaints, pt demo'd improve body mechanics to minimize straining abdominal and pelvic floor mm. Initiated hip abduction strengthening which pt required cues for proper technique. Plan to address spinal deviations at next session and then assess pelvic floor coordination after pt achieves more aligned spine to yield better outcomes.      Examination-Activity Limitations Locomotion Level;Lift;Continence;Squat    Stability/Clinical Decision Making Stable/Uncomplicated    Rehab Potential Good    PT Frequency 1x / week    PT Duration Other (comment)   10   PT Treatment/Interventions Neuromuscular re-education;Balance training;Moist Heat;Functional mobility training;Therapeutic activities;Therapeutic exercise;Patient/family education;Manual techniques;Scar mobilization;Splinting;Taping;Biofeedback;Gait training;Stair training;Dry needling;Spinal Manipulations;Joint Manipulations    Consulted and Agree with Plan of Care Patient           Patient will benefit from skilled therapeutic intervention in order to improve the following deficits and impairments:  Decreased strength,Decreased endurance,Decreased balance,Increased muscle spasms,Improper body mechanics,Pain,Postural dysfunction,Hypomobility,Decreased coordination,Decreased mobility,Decreased safety awareness,Decreased activity tolerance,Increased fascial restricitons,Abnormal gait  Visit Diagnosis: Other idiopathic scoliosis, lumbar region  Other lack of coordination  Other muscle spasm     Problem List Patient Active Problem List   Diagnosis Date Noted  . Personal history of colonic polyps   . S/P cervical spinal fusion 01/25/2018  . Prostate enlargement 02/23/2015  . DDD (degenerative disc disease), lumbar 01/18/2015  . Urinary  frequency 01/17/2015  . Neck pain, chronic  01/17/2015  . History of adenomatous polyp of colon   . Arthritis 08/24/2014  . BPH (benign prostatic hypertrophy) 08/24/2014  . Erectile dysfunction 08/24/2014  . Peyronie's disease 06/28/2012  . Cervicalgia 07/12/2007  . Esophagitis, reflux 09/29/2005  . Cervical spondylosis without myelopathy 01/06/1998    Jerl Mina ,PT, DPT, E-RYT  04/03/2020, 3:06 PM  [employer] MAIN [facility] SERVICES [address] [place], [place], [address] Phone: [phone]   Fax:  [phone]  Name: DIMITRIY CARRERAS MRN: [id number] Date of Birth: 1955/08/23

## 2020-04-09 ENCOUNTER — Ambulatory Visit: Payer: Medicare PPO | Admitting: Physical Therapy

## 2020-04-12 ENCOUNTER — Ambulatory Visit: Payer: Medicare PPO | Attending: Urology | Admitting: Physical Therapy

## 2020-04-12 ENCOUNTER — Other Ambulatory Visit: Payer: Self-pay

## 2020-04-12 DIAGNOSIS — R278 Other lack of coordination: Secondary | ICD-10-CM | POA: Diagnosis not present

## 2020-04-12 DIAGNOSIS — M4125 Other idiopathic scoliosis, thoracolumbar region: Secondary | ICD-10-CM | POA: Diagnosis not present

## 2020-04-12 DIAGNOSIS — M4126 Other idiopathic scoliosis, lumbar region: Secondary | ICD-10-CM

## 2020-04-12 DIAGNOSIS — M62838 Other muscle spasm: Secondary | ICD-10-CM

## 2020-04-12 NOTE — Patient Instructions (Addendum)
Stretches :   Neck / shoulder stretches:    Lying on back - small sushi roll towel under neck  _ 6 directions  5 reps  _elbow circles in and out to lower shoulder blades down and back  10 reps  _angel wings, lower elbows down , keep arms touching bed  10 reps    At work:  _Doorway squat : bear stretch for shoulder blades _wall stretch Clock stretch with head turns :  stand perpendicular to the wall, L side to the wall Tilt head to wall  Place L palm at 7 o clock Chin tuck like you are looking into armpit Look at "[place] on giant map  Swivel head with chin tucked to look in upper corner of ceiling as if you are look at  [place] on giant map "   5 reps   Switch direction, R palm on wall at 5 o 'clock   Chin tuck like you are looking into armpit Look at "[place]  on giant map  Swivel head with chin tucked to look in upper corner of ceiling as if you are look at  [place] on giant map "   5 reps

## 2020-04-13 NOTE — Therapy (Signed)
[place] MAIN [facility] SERVICES [address] [place], [place], [address] Phone: [phone]   Fax:  [phone]  Physical Therapy Treatment  Patient Details  Name: Micheal Mccoy MRN: [id number] Date of Birth: 1955-08-28 Referring Provider (PT): Zara Council   Encounter Date: 04/12/2020   PT End of Session - 04/12/20 1111    Visit Number 2    Number of Visits 10    Date for PT Re-Evaluation 06/11/20    Authorization Type humana    PT Start Time 0904    PT Stop Time 1000    PT Time Calculation (min) 56 min    Activity Tolerance Patient tolerated treatment well    Behavior During Therapy [facility] for tasks assessed/performed           Past Medical History:  Diagnosis Date  . Arthritis   . Benign neoplasm of ascending colon   . Degenerative disc disease, cervical   . Esophagitis, reflux 09/29/2005  . Frequency of urination   . GERD (gastroesophageal reflux disease)   . Peyronie's disease    W/ PENILE CURVATURE    Past Surgical History:  Procedure Laterality Date  . ANTERIOR CERVICAL DECOMP/DISCECTOMY FUSION N/A 01/25/2018   Procedure: ANTERIOR CERVICAL DECOMPRESSION/DISCECTOMY FUSION 1 LEVEL C7-T1, T1-T2;  Surgeon: Deetta Perla, MD;  Location: [facility];  Service: Neurosurgery;  Laterality: N/A;  . ANTERIOR CERVICAL DISCECTOMY  01/25/2018   Dr. Lacinda Axon, ACDF ARTHRODESIS, OSTEOPHYTECTOMY &DECOMPRESSION OF SPINAL CORD AND/ORNERVE ROOTS; BELOW C2--C7-T1  . COLONOSCOPY WITH PROPOFOL N/A 09/14/2014   Procedure: COLONOSCOPY WITH PROPOFOL;  Surgeon: Lucilla Lame, MD;  Location: [place];  Service: Endoscopy;  Laterality: N/A;  . COLONOSCOPY WITH PROPOFOL N/A 01/31/2020   Procedure: COLONOSCOPY WITH PROPOFOL;  Surgeon: Lucilla Lame, MD;  Location: [facility];  Service: Endoscopy;  Laterality: N/A;  . HERNIA REPAIR Bilateral 04/09/2015   Bard 3D Max mesh for bilateral inguinal hernias, primary repair of umbilical henria.   Marland Kitchen HOLEP-LASER  ENUCLEATION OF THE PROSTATE WITH MORCELLATION N/A 02/17/2019   Procedure: HOLEP-LASER ENUCLEATION OF THE PROSTATE WITH MORCELLATION;  Surgeon: Billey Co, MD;  Location: [facility];  Service: Urology;  Laterality: N/A;  . INGUINAL HERNIA REPAIR Bilateral 04/09/2015   Procedure: LAPAROSCOPIC INGUINAL HERNIA;  Surgeon: Robert Bellow, MD;  Location: [facility];  Service: General;  Laterality: Bilateral;  . NESBIT PROCEDURE N/A 06/28/2012   Procedure: 16 DOT PLACTATION PROCEDURE;  Surgeon: Claybon Jabs, MD;  Location: [facility];  Service: Urology;  Laterality: N/A;  . Peyronies Disease  06/2012  . POLYPECTOMY  09/14/2014   Procedure: POLYPECTOMY;  Surgeon: Lucilla Lame, MD;  Location: [place];  Service: Endoscopy;;  . PROSTATE BIOPSY  03-02-15   neg  . THORACIC DISCECTOMY  01/25/2018   Dr. Lacinda Axon, ARTHRODESIS ANTERIOR INTERBODY W/DISCECTOMY ADDITIONAL LEVEL T1-T2  . UMBILICAL HERNIA REPAIR N/A 04/09/2015   Procedure: HERNIA REPAIR UMBILICAL ADULT;  Surgeon: Robert Bellow, MD;  Location: [facility];  Service: General;  Laterality: N/A;    There were no vitals filed for this visit.   Subjective Assessment - 04/12/20 0908    Subjective PPt did not do any crunches not sit ups last week. He didn ot have time to workout.              [facility] PT Assessment - 04/13/20 1004      Observation/Other Assessments   Observations R shoulder lower ( 20 cm earlobe to acromium), L 19 cm  Coordination   Gross Motor Movements are Fluid and Coordinated --   poor propioception for cervial retraction     Palpation   Palpation comment hypomobile L upper lumbar , L cervical 6-7 segments, tightenss at L medial scapular/ scalenes/ upepr trap, STM L , intercostals                         [facility] Adult PT Treatment/Exercise - 04/13/20 1003      Neuro Re-ed    Neuro Re-ed Details  cued for neck stretches ( plan to add thoracic stretches at next session)      Manual  Therapy   Manual therapy comments STM/MWM at problem areas noted, distraction at occiput and C4-5, to promote alignmetn of spinal curves, decreased mm tensions on L shoulder                       PT Long Term Goals - 04/12/20 0912      PT LONG TERM GOAL #1   Title Pt will demo proper coordination with pelvic floor and no overuse of abs  to minimize leakage with walking, weight lifting and minimize risk for worsening of hernia    Time 4    Period Weeks    Status New      PT LONG TERM GOAL #2   Title Pt will report decreased straining with bowel movements from across 50% of the time to less than 25% of the time in order to minimzie blood in stools    Time 6    Period Weeks    Status New      PT LONG TERM GOAL #3   Title Pt will demo proper technique with his fitness routine to minimize straining of pelvic floor mm and decrease leakage    Time 8    Period Weeks    Status New      PT LONG TERM GOAL #4   Title Pt will increase his FOTO score for urinary from 54 pts to > 60 pts in order to improve QOL    Time 8    Period Weeks    Status New      PT LONG TERM GOAL #5   Title Pt will demo decreased mm tightnes on R pelvic floor mm and increased RLE strength in order to progress to kegel strengthening exercises with greater outcomes    Time 6    Period Weeks    Status New      Additional Long Term Goals   Additional Long Term Goals Yes      PT LONG TERM GOAL #6   Title Pt demo equal shoulder height to minimize injuries with weight lifting and to optiomize diaphragm to progress toward pelvic floor exercises    Baseline R shoulder lower ( 20 cm earlobe to acromium), L 19 cm    Time 6    Period Weeks    Status New    Target Date 05/24/20                 Plan - 04/12/20 1112    Clinical Impression Statement Initiated manual Tx to address spinal deviations and hypomobility. Pt demo'd improved diaphragmatic excursion and less chest breathing post Tx which will  yield better outcomes with deep core training and pelvic floor control. Pt continues to benefit from skilled PT. Plan to add more spinal stretches to improve mm imbalance and  deep core training  next session.    Examination-Activity Limitations Locomotion Level;Lift;Continence;Squat    Stability/Clinical Decision Making Stable/Uncomplicated    Rehab Potential Good    PT Frequency 1x / week    PT Duration Other (comment)   10   PT Treatment/Interventions Neuromuscular re-education;Balance training;Moist Heat;Functional mobility training;Therapeutic activities;Therapeutic exercise;Patient/family education;Manual techniques;Scar mobilization;Splinting;Taping;Biofeedback;Gait training;Stair training;Dry needling;Spinal Manipulations;Joint Manipulations    Consulted and Agree with Plan of Care Patient           Patient will benefit from skilled therapeutic intervention in order to improve the following deficits and impairments:  Decreased strength,Decreased endurance,Decreased balance,Increased muscle spasms,Improper body mechanics,Pain,Postural dysfunction,Hypomobility,Decreased coordination,Decreased mobility,Decreased safety awareness,Decreased activity tolerance,Increased fascial restricitons,Abnormal gait  Visit Diagnosis: Other idiopathic scoliosis, lumbar region  Other lack of coordination  Other muscle spasm     Problem List Patient Active Problem List   Diagnosis Date Noted  . Personal history of colonic polyps   . S/P cervical spinal fusion 01/25/2018  . Prostate enlargement 02/23/2015  . DDD (degenerative disc disease), lumbar 01/18/2015  . Urinary frequency 01/17/2015  . Neck pain, chronic 01/17/2015  . History of adenomatous polyp of colon   . Arthritis 08/24/2014  . BPH (benign prostatic hypertrophy) 08/24/2014  . Erectile dysfunction 08/24/2014  . Peyronie's disease 06/28/2012  . Cervicalgia 07/12/2007  . Esophagitis, reflux 09/29/2005  . Cervical spondylosis  without myelopathy 01/06/1998    Jerl Mina ,PT, DPT, E-RYT  04/13/2020, 10:05 AM  [place] MAIN [facility] SERVICES [address] [place], [place], [address] Phone: [phone]   Fax:  [phone]  Name: Micheal Mccoy MRN: [id number] Date of Birth: 01-28-1955

## 2020-04-16 ENCOUNTER — Other Ambulatory Visit: Payer: Self-pay

## 2020-04-16 ENCOUNTER — Ambulatory Visit: Payer: Medicare PPO | Admitting: Physical Therapy

## 2020-04-16 DIAGNOSIS — R278 Other lack of coordination: Secondary | ICD-10-CM | POA: Diagnosis not present

## 2020-04-16 DIAGNOSIS — M62838 Other muscle spasm: Secondary | ICD-10-CM | POA: Diagnosis not present

## 2020-04-16 DIAGNOSIS — M4126 Other idiopathic scoliosis, lumbar region: Secondary | ICD-10-CM

## 2020-04-16 DIAGNOSIS — M4125 Other idiopathic scoliosis, thoracolumbar region: Secondary | ICD-10-CM | POA: Diagnosis not present

## 2020-04-16 NOTE — Patient Instructions (Signed)
Multifidis twist  Band is on doorknob: stand further away from door (facing perpendicular)   Twisting trunk without moving the hips and knees Hold band at the level of ribcage, elbows bent,shoulder blades roll back and down like squeezing a pencil under armpit    Exhale twist,.10-15 deg away from door without moving your hips/ knees. Continue to maintain equal weight through legs. Keep knee unlocked.  10 x 3   ___   Try shoe lift in R shoe this week  remove if uncomfortable or cause pain

## 2020-04-16 NOTE — Therapy (Signed)
[place] MAIN [facility] SERVICES [address] [place], [place], [address] Phone: [phone]   Fax:  [phone]  Physical Therapy Treatment  Patient Details  Name: Micheal Mccoy MRN: [id number] Date of Birth: 1955-11-20 Referring Provider (PT): Zara Council   Encounter Date: 04/16/2020   PT End of Session - 04/16/20 1139    Visit Number 3    Number of Visits 10    Date for PT Re-Evaluation 06/11/20    Authorization Type humana 12 visits from 04/12/20- 06/12/20    PT Start Time 0903    PT Stop Time 1000    PT Time Calculation (min) 57 min    Activity Tolerance Patient tolerated treatment well    Behavior During Therapy [facility] for tasks assessed/performed           Past Medical History:  Diagnosis Date  . Arthritis   . Benign neoplasm of ascending colon   . Degenerative disc disease, cervical   . Esophagitis, reflux 09/29/2005  . Frequency of urination   . GERD (gastroesophageal reflux disease)   . Peyronie's disease    W/ PENILE CURVATURE    Past Surgical History:  Procedure Laterality Date  . ANTERIOR CERVICAL DECOMP/DISCECTOMY FUSION N/A 01/25/2018   Procedure: ANTERIOR CERVICAL DECOMPRESSION/DISCECTOMY FUSION 1 LEVEL C7-T1, T1-T2;  Surgeon: Deetta Perla, MD;  Location: [facility];  Service: Neurosurgery;  Laterality: N/A;  . ANTERIOR CERVICAL DISCECTOMY  01/25/2018   Dr. Lacinda Axon, ACDF ARTHRODESIS, OSTEOPHYTECTOMY &DECOMPRESSION OF SPINAL CORD AND/ORNERVE ROOTS; BELOW C2--C7-T1  . COLONOSCOPY WITH PROPOFOL N/A 09/14/2014   Procedure: COLONOSCOPY WITH PROPOFOL;  Surgeon: Lucilla Lame, MD;  Location: [place];  Service: Endoscopy;  Laterality: N/A;  . COLONOSCOPY WITH PROPOFOL N/A 01/31/2020   Procedure: COLONOSCOPY WITH PROPOFOL;  Surgeon: Lucilla Lame, MD;  Location: [facility];  Service: Endoscopy;  Laterality: N/A;  . HERNIA REPAIR Bilateral 04/09/2015   Bard 3D Max mesh for bilateral inguinal hernias, primary repair of  umbilical henria.   Marland Kitchen HOLEP-LASER ENUCLEATION OF THE PROSTATE WITH MORCELLATION N/A 02/17/2019   Procedure: HOLEP-LASER ENUCLEATION OF THE PROSTATE WITH MORCELLATION;  Surgeon: Billey Co, MD;  Location: [facility];  Service: Urology;  Laterality: N/A;  . INGUINAL HERNIA REPAIR Bilateral 04/09/2015   Procedure: LAPAROSCOPIC INGUINAL HERNIA;  Surgeon: Robert Bellow, MD;  Location: [facility];  Service: General;  Laterality: Bilateral;  . NESBIT PROCEDURE N/A 06/28/2012   Procedure: 16 DOT PLACTATION PROCEDURE;  Surgeon: Claybon Jabs, MD;  Location: [facility];  Service: Urology;  Laterality: N/A;  . Peyronies Disease  06/2012  . POLYPECTOMY  09/14/2014   Procedure: POLYPECTOMY;  Surgeon: Lucilla Lame, MD;  Location: [place];  Service: Endoscopy;;  . PROSTATE BIOPSY  03-02-15   neg  . THORACIC DISCECTOMY  01/25/2018   Dr. Lacinda Axon, ARTHRODESIS ANTERIOR INTERBODY W/DISCECTOMY ADDITIONAL LEVEL T1-T2  . UMBILICAL HERNIA REPAIR N/A 04/09/2015   Procedure: HERNIA REPAIR UMBILICAL ADULT;  Surgeon: Robert Bellow, MD;  Location: [facility];  Service: General;  Laterality: N/A;    There were no vitals filed for this visit.   Subjective Assessment - 04/16/20 0907    Subjective Pt reported no complaints after last sessions . Today his back feels tweeked in the middle. Pt had to take a Motrin              [facility] PT Assessment - 04/16/20 0909      Coordination   Coordination and Movement Description moderate  cues for dissassociation of trunk and pelvis      Palpation   Spinal mobility tightness at R upper quadrant    Palpation comment L iliaccrest , shoulder, patella higher      Ambulation/Gait   Gait Comments limited L posterior thoracic rotation, excessive sway R pelvis                         [facility] Adult PT Treatment/Exercise - 04/16/20 0909      Therapeutic Activites    Other Therapeutic Activities provided shoe lift in R shoe      Neuro Re-ed     Neuro Re-ed Details  cued for HEP, multidifis twist ,      Manual Therapy   Manual therapy comments STM/MWM R scalenes/ medial scapular mm, teres minor/ serratus anterior                       PT Long Term Goals - 04/12/20 0912      PT LONG TERM GOAL #1   Title Pt will demo proper coordination with pelvic floor and no overuse of abs  to minimize leakage with walking, weight lifting and minimize risk for worsening of hernia    Time 4    Period Weeks    Status New      PT LONG TERM GOAL #2   Title Pt will report decreased straining with bowel movements from across 50% of the time to less than 25% of the time in order to minimzie blood in stools    Time 6    Period Weeks    Status New      PT LONG TERM GOAL #3   Title Pt will demo proper technique with his fitness routine to minimize straining of pelvic floor mm and decrease leakage    Time 8    Period Weeks    Status New      PT LONG TERM GOAL #4   Title Pt will increase his FOTO score for urinary from 54 pts to > 60 pts in order to improve QOL    Time 8    Period Weeks    Status New      PT LONG TERM GOAL #5   Title Pt will demo decreased mm tightnes on R pelvic floor mm and increased RLE strength in order to progress to kegel strengthening exercises with greater outcomes    Time 6    Period Weeks    Status New      Additional Long Term Goals   Additional Long Term Goals Yes      PT LONG TERM GOAL #6   Title Pt demo equal shoulder height to minimize injuries with weight lifting and to optiomize diaphragm to progress toward pelvic floor exercises    Baseline R shoulder lower ( 20 cm earlobe to acromium), L 19 cm    Time 6    Period Weeks    Status New    Target Date 05/24/20                 Plan - 04/16/20 1442    Clinical Impression Statement Pt contiuned to require manual Tx to minimize spinal asymmetries to address lowered R shoulder. Shoe lift was placed in R shoe after which pelvic girdle  and patella height became more levelled. Pt continues to benefit from skilled PT. Plan to add deep core coordination at next session as pt demo'd  improved diaphragmatic excursion with less upper neck mm tightness. Pt continues to benefit from skilled PT    Examination-Activity Limitations Locomotion Level;Lift;Continence;Squat    Stability/Clinical Decision Making Stable/Uncomplicated    Rehab Potential Good    PT Frequency 1x / week    PT Duration Other (comment)   10   PT Treatment/Interventions Neuromuscular re-education;Balance training;Moist Heat;Functional mobility training;Therapeutic activities;Therapeutic exercise;Patient/family education;Manual techniques;Scar mobilization;Splinting;Taping;Biofeedback;Gait training;Stair training;Dry needling;Spinal Manipulations;Joint Manipulations    Consulted and Agree with Plan of Care Patient           Patient will benefit from skilled therapeutic intervention in order to improve the following deficits and impairments:  Decreased strength,Decreased endurance,Decreased balance,Increased muscle spasms,Improper body mechanics,Pain,Postural dysfunction,Hypomobility,Decreased coordination,Decreased mobility,Decreased safety awareness,Decreased activity tolerance,Increased fascial restricitons,Abnormal gait  Visit Diagnosis: Other idiopathic scoliosis, lumbar region  Other lack of coordination  Other muscle spasm     Problem List Patient Active Problem List   Diagnosis Date Noted  . Personal history of colonic polyps   . S/P cervical spinal fusion 01/25/2018  . Prostate enlargement 02/23/2015  . DDD (degenerative disc disease), lumbar 01/18/2015  . Urinary frequency 01/17/2015  . Neck pain, chronic 01/17/2015  . History of adenomatous polyp of colon   . Arthritis 08/24/2014  . BPH (benign prostatic hypertrophy) 08/24/2014  . Erectile dysfunction 08/24/2014  . Peyronie's disease 06/28/2012  . Cervicalgia 07/12/2007  . Esophagitis,  reflux 09/29/2005  . Cervical spondylosis without myelopathy 01/06/1998    Jerl Mina ,PT, DPT, E-RYT  04/16/2020, 2:44 PM  [place] MAIN [facility] SERVICES [address] [place], [place], [address] Phone: [phone]   Fax:  [phone]  Name: Micheal Mccoy MRN: [id number] Date of Birth: 02/01/1955

## 2020-04-23 ENCOUNTER — Ambulatory Visit: Payer: Medicare PPO | Admitting: Physical Therapy

## 2020-04-23 ENCOUNTER — Other Ambulatory Visit: Payer: Self-pay

## 2020-04-23 DIAGNOSIS — M4125 Other idiopathic scoliosis, thoracolumbar region: Secondary | ICD-10-CM | POA: Diagnosis not present

## 2020-04-23 DIAGNOSIS — M62838 Other muscle spasm: Secondary | ICD-10-CM

## 2020-04-23 DIAGNOSIS — R278 Other lack of coordination: Secondary | ICD-10-CM

## 2020-04-23 DIAGNOSIS — M4126 Other idiopathic scoliosis, lumbar region: Secondary | ICD-10-CM | POA: Diagnosis not present

## 2020-04-23 NOTE — Therapy (Addendum)
[place] MAIN [facility] SERVICES [address] [place], [place], [address] Phone: [phone]   Fax:  [phone]  Physical Therapy Treatment  Patient Details  Name: Micheal Mccoy MRN: [id number] Date of Birth: 1955-10-09 Referring Provider (PT): Zara Council   Encounter Date: 04/23/2020   PT End of Session - 04/23/20 1010    Visit Number 4    Number of Visits 10    Date for PT Re-Evaluation 06/11/20    Authorization Type humana 12 visits from 04/12/20- 06/12/20    PT Start Time 0904    PT Stop Time 1000    PT Time Calculation (min) 56 min    Activity Tolerance Patient tolerated treatment well    Behavior During Therapy [facility] for tasks assessed/performed           Past Medical History:  Diagnosis Date  . Arthritis   . Benign neoplasm of ascending colon   . Degenerative disc disease, cervical   . Esophagitis, reflux 09/29/2005  . Frequency of urination   . GERD (gastroesophageal reflux disease)   . Peyronie's disease    W/ PENILE CURVATURE    Past Surgical History:  Procedure Laterality Date  . ANTERIOR CERVICAL DECOMP/DISCECTOMY FUSION N/A 01/25/2018   Procedure: ANTERIOR CERVICAL DECOMPRESSION/DISCECTOMY FUSION 1 LEVEL C7-T1, T1-T2;  Surgeon: Deetta Perla, MD;  Location: [facility];  Service: Neurosurgery;  Laterality: N/A;  . ANTERIOR CERVICAL DISCECTOMY  01/25/2018   Dr. Lacinda Axon, ACDF ARTHRODESIS, OSTEOPHYTECTOMY &DECOMPRESSION OF SPINAL CORD AND/ORNERVE ROOTS; BELOW C2--C7-T1  . COLONOSCOPY WITH PROPOFOL N/A 09/14/2014   Procedure: COLONOSCOPY WITH PROPOFOL;  Surgeon: Lucilla Lame, MD;  Location: [place];  Service: Endoscopy;  Laterality: N/A;  . COLONOSCOPY WITH PROPOFOL N/A 01/31/2020   Procedure: COLONOSCOPY WITH PROPOFOL;  Surgeon: Lucilla Lame, MD;  Location: [facility];  Service: Endoscopy;  Laterality: N/A;  . HERNIA REPAIR Bilateral 04/09/2015   Bard 3D Max mesh for bilateral inguinal hernias, primary repair of  umbilical henria.   Marland Kitchen HOLEP-LASER ENUCLEATION OF THE PROSTATE WITH MORCELLATION N/A 02/17/2019   Procedure: HOLEP-LASER ENUCLEATION OF THE PROSTATE WITH MORCELLATION;  Surgeon: Billey Co, MD;  Location: [facility];  Service: Urology;  Laterality: N/A;  . INGUINAL HERNIA REPAIR Bilateral 04/09/2015   Procedure: LAPAROSCOPIC INGUINAL HERNIA;  Surgeon: Robert Bellow, MD;  Location: [facility];  Service: General;  Laterality: Bilateral;  . NESBIT PROCEDURE N/A 06/28/2012   Procedure: 16 DOT PLACTATION PROCEDURE;  Surgeon: Claybon Jabs, MD;  Location: [facility];  Service: Urology;  Laterality: N/A;  . Peyronies Disease  06/2012  . POLYPECTOMY  09/14/2014   Procedure: POLYPECTOMY;  Surgeon: Lucilla Lame, MD;  Location: [place];  Service: Endoscopy;;  . PROSTATE BIOPSY  03-02-15   neg  . THORACIC DISCECTOMY  01/25/2018   Dr. Lacinda Axon, ARTHRODESIS ANTERIOR INTERBODY W/DISCECTOMY ADDITIONAL LEVEL T1-T2  . UMBILICAL HERNIA REPAIR N/A 04/09/2015   Procedure: HERNIA REPAIR UMBILICAL ADULT;  Surgeon: Robert Bellow, MD;  Location: [facility];  Service: General;  Laterality: N/A;    There were no vitals filed for this visit.   Subjective Assessment - 04/23/20 0907    Subjective Pt reported his back feels sore after from doing yard work. Pt wore the shoe lift all week without issues. Pt did not wear it today              [facility] PT Assessment - 04/23/20 0909      Observation/Other Assessments  Observations R shoulder less lower ( B 20 cm earlobe to acromium    Scoliosis no lumbar convex      Palpation   SI assessment  levelled iliac crest,    Palpation comment C7 deviated to R, interspinal mm tight, suprahyoid tight on L                         [facility] Adult PT Treatment/Exercise - 04/23/20 0948      Neuro Re-ed    Neuro Re-ed Details  cued for neck propioception to thorax, deep core level 1 and 2 and new scapulothoracic resistance band exercise       Moist Heat Therapy   Number Minutes Moist Heat 5 Minutes    Moist Heat Location --   back     Manual Therapy   Manual therapy comments STM/MWM L interspinals, suprahyoid, inferior glide of clavicle with cerv ext to promote lowered shoulder                       PT Long Term Goals - 04/12/20 0912      PT LONG TERM GOAL #1   Title Pt will demo proper coordination with pelvic floor and no overuse of abs  to minimize leakage with walking, weight lifting and minimize risk for worsening of hernia    Time 4    Period Weeks    Status New      PT LONG TERM GOAL #2   Title Pt will report decreased straining with bowel movements from across 50% of the time to less than 25% of the time in order to minimzie blood in stools    Time 6    Period Weeks    Status New      PT LONG TERM GOAL #3   Title Pt will demo proper technique with his fitness routine to minimize straining of pelvic floor mm and decrease leakage    Time 8    Period Weeks    Status New      PT LONG TERM GOAL #4   Title Pt will increase his FOTO score for urinary from 54 pts to > 60 pts in order to improve QOL    Time 8    Period Weeks    Status New      PT LONG TERM GOAL #5   Title Pt will demo decreased mm tightnes on R pelvic floor mm and increased RLE strength in order to progress to kegel strengthening exercises with greater outcomes    Time 6    Period Weeks    Status New      Additional Long Term Goals   Additional Long Term Goals Yes      PT LONG TERM GOAL #6   Title Pt demo equal shoulder height to minimize injuries with weight lifting and to optiomize diaphragm to progress toward pelvic floor exercises    Baseline R shoulder lower ( 20 cm earlobe to acromium), L 19 cm    Time 6    Period Weeks    Status New    Target Date 05/24/20                 Plan - 04/23/20 1009    Clinical Impression Statement Pt demo good carry over with levelled pelvic girdle and much less deviated spine. Pt  was abel to do yard work with pavers and placement of sod without back pain this  past weekend which indicates stronger back.    Today, pt required manual Tx to L neck due to deviated C7 and interspinals mm. Progressed pt to cervical propicoeption 2/2 Hx of neck fusion and scapulothoracic strengthening.  Also progressed to deep core level 1 and 2 with minor cues for diaphragmatic excursion and pelvic floor coordination. Pt will be ready for kegel training at upcoming sessions now that his pelvic girdle/ spine and diaphragmatic excursion have improved. Pt continues to benefit from skilled PT    Examination-Activity Limitations Locomotion Level;Lift;Continence;Squat    Stability/Clinical Decision Making Stable/Uncomplicated    Rehab Potential Good    PT Frequency 1x / week    PT Duration Other (comment)   10   PT Treatment/Interventions Neuromuscular re-education;Balance training;Moist Heat;Functional mobility training;Therapeutic activities;Therapeutic exercise;Patient/family education;Manual techniques;Scar mobilization;Splinting;Taping;Biofeedback;Gait training;Stair training;Dry needling;Spinal Manipulations;Joint Manipulations    Consulted and Agree with Plan of Care Patient           Patient will benefit from skilled therapeutic intervention in order to improve the following deficits and impairments:  Decreased strength,Decreased endurance,Decreased balance,Increased muscle spasms,Improper body mechanics,Pain,Postural dysfunction,Hypomobility,Decreased coordination,Decreased mobility,Decreased safety awareness,Decreased activity tolerance,Increased fascial restricitons,Abnormal gait  Visit Diagnosis: Other idiopathic scoliosis, lumbar region  Other lack of coordination  Other muscle spasm     Problem List Patient Active Problem List   Diagnosis Date Noted  . Personal history of colonic polyps   . S/P cervical spinal fusion 01/25/2018  . Prostate enlargement 02/23/2015  . DDD  (degenerative disc disease), lumbar 01/18/2015  . Urinary frequency 01/17/2015  . Neck pain, chronic 01/17/2015  . History of adenomatous polyp of colon   . Arthritis 08/24/2014  . BPH (benign prostatic hypertrophy) 08/24/2014  . Erectile dysfunction 08/24/2014  . Peyronie's disease 06/28/2012  . Cervicalgia 07/12/2007  . Esophagitis, reflux 09/29/2005  . Cervical spondylosis without myelopathy 01/06/1998    Jerl Mina ,PT, DPT, E-RYT  04/23/2020, 10:12 AM  [place] MAIN [facility] SERVICES [address] [place], [place], [address] Phone: [phone]   Fax:  [phone]  Name: Micheal Mccoy MRN: [id number] Date of Birth: 07/12/1955

## 2020-04-23 NOTE — Patient Instructions (Signed)
Lying on back, knees bent    band under ballmounds  while laying on back w/ knees bent  "W" exercise  10 reps x 2 sets   Band is placed under feet, knees bent, feet are hip width apart Hold band with thumbs point out, keep upper arm and elbow touching the bed the whole time  - inhale and then exhale pull bands by bending elbows hands move in a "w"  (feel shoulder blades squeezing)    __   Deep core level 1 and 2  See handout

## 2020-04-30 ENCOUNTER — Ambulatory Visit: Payer: Medicare PPO | Admitting: Physical Therapy

## 2020-05-07 ENCOUNTER — Other Ambulatory Visit: Payer: Self-pay

## 2020-05-07 ENCOUNTER — Ambulatory Visit: Payer: Medicare PPO | Attending: Urology | Admitting: Physical Therapy

## 2020-05-07 DIAGNOSIS — M62838 Other muscle spasm: Secondary | ICD-10-CM | POA: Diagnosis not present

## 2020-05-07 DIAGNOSIS — R278 Other lack of coordination: Secondary | ICD-10-CM | POA: Diagnosis not present

## 2020-05-07 DIAGNOSIS — M4126 Other idiopathic scoliosis, lumbar region: Secondary | ICD-10-CM | POA: Diagnosis not present

## 2020-05-07 NOTE — Therapy (Signed)
[place] MAIN [facility] SERVICES [address] [place], [place], [address] Phone: [phone]   Fax:  [phone]  Physical Therapy Treatment  Patient Details  Name: Micheal Mccoy MRN: [id number] Date of Birth: 02-01-1955 Referring Provider (PT): Zara Council   Encounter Date: 05/07/2020   PT End of Session - 05/07/20 0917    Visit Number 5    Number of Visits 10    Date for PT Re-Evaluation 06/11/20    Authorization Type humana 12 visits from 04/12/20- 06/12/20  ( 3/12 )    PT Start Time 0910    PT Stop Time 1005    PT Time Calculation (min) 55 min    Activity Tolerance Patient tolerated treatment well    Behavior During Therapy [facility] for tasks assessed/performed           Past Medical History:  Diagnosis Date  . Arthritis   . Benign neoplasm of ascending colon   . Degenerative disc disease, cervical   . Esophagitis, reflux 09/29/2005  . Frequency of urination   . GERD (gastroesophageal reflux disease)   . Peyronie's disease    W/ PENILE CURVATURE    Past Surgical History:  Procedure Laterality Date  . ANTERIOR CERVICAL DECOMP/DISCECTOMY FUSION N/A 01/25/2018   Procedure: ANTERIOR CERVICAL DECOMPRESSION/DISCECTOMY FUSION 1 LEVEL C7-T1, T1-T2;  Surgeon: Deetta Perla, MD;  Location: [facility];  Service: Neurosurgery;  Laterality: N/A;  . ANTERIOR CERVICAL DISCECTOMY  01/25/2018   Dr. Lacinda Axon, ACDF ARTHRODESIS, OSTEOPHYTECTOMY &DECOMPRESSION OF SPINAL CORD AND/ORNERVE ROOTS; BELOW C2--C7-T1  . COLONOSCOPY WITH PROPOFOL N/A 09/14/2014   Procedure: COLONOSCOPY WITH PROPOFOL;  Surgeon: Lucilla Lame, MD;  Location: [place];  Service: Endoscopy;  Laterality: N/A;  . COLONOSCOPY WITH PROPOFOL N/A 01/31/2020   Procedure: COLONOSCOPY WITH PROPOFOL;  Surgeon: Lucilla Lame, MD;  Location: [facility] ENDOSCOPY;  Service: Endoscopy;  Laterality: N/A;  . HERNIA REPAIR Bilateral 04/09/2015   Bard 3D Max mesh for bilateral inguinal hernias, primary  repair of umbilical henria.   Marland Kitchen HOLEP-LASER ENUCLEATION OF THE PROSTATE WITH MORCELLATION N/A 02/17/2019   Procedure: HOLEP-LASER ENUCLEATION OF THE PROSTATE WITH MORCELLATION;  Surgeon: Billey Co, MD;  Location: [facility];  Service: Urology;  Laterality: N/A;  . INGUINAL HERNIA REPAIR Bilateral 04/09/2015   Procedure: LAPAROSCOPIC INGUINAL HERNIA;  Surgeon: Robert Bellow, MD;  Location: [facility];  Service: General;  Laterality: Bilateral;  . NESBIT PROCEDURE N/A 06/28/2012   Procedure: 16 DOT PLACTATION PROCEDURE;  Surgeon: Claybon Jabs, MD;  Location: [facility];  Service: Urology;  Laterality: N/A;  . Peyronies Disease  06/2012  . POLYPECTOMY  09/14/2014   Procedure: POLYPECTOMY;  Surgeon: Lucilla Lame, MD;  Location: [place];  Service: Endoscopy;;  . PROSTATE BIOPSY  03-02-15   neg  . THORACIC DISCECTOMY  01/25/2018   Dr. Lacinda Axon, ARTHRODESIS ANTERIOR INTERBODY W/DISCECTOMY ADDITIONAL LEVEL T1-T2  . UMBILICAL HERNIA REPAIR N/A 04/09/2015   Procedure: HERNIA REPAIR UMBILICAL ADULT;  Surgeon: Robert Bellow, MD;  Location: [facility];  Service: General;  Laterality: N/A;    There were no vitals filed for this visit.   Subjective Assessment - 05/07/20 0915    Subjective Pt reported he noticed less leakage. Pt has no more neck pain and soreness with low back and 3 episodes with tightness.  Pt's workout routine includes 20-30 min on stationary bike but has not been doing that. Pt walks for aerobic. Pt work out at [employer] 4 x  week. Pt has dumbbells and bench and pulley system.              [facility] PT Assessment - 05/07/20 1005      Observation/Other Assessments   Observations upright posture                      Pelvic Floor Special Questions - 05/07/20 1004    External Perineal Exam through clothing , tightness ofL bulbospongiosus,  5 reps in hooklying,  difficulty in sitting with posterior mm dominant and minimal diaphragmatic breathing,  difficulty with diaphragmatic breathing in seated and thus withheld pelvic floor quick contractions in seated position             [facility] Adult PT Treatment/Exercise - 05/07/20 1149      Therapeutic Activites    Other Therapeutic Activities discussed fitness goals, routine, explaiend principles of stability, modifying fitness routine , discussed pelvic tilt to help with low back soreness and when lying on back      Neuro Re-ed    Neuro Re-ed Details  cued for pelvic tilt, tactile and verbal cues, cued for quick pelvic floor contractions in hooklying, Withheld seated quick contractioned in seated position      Moist Heat Therapy   Number Minutes Moist Heat 5 Minutes    Moist Heat Location --   perineum to promote lengthened pelvic floor     Manual Therapy   Manual therapy comments STM/MWM at bulbospongiosus L                       PT Long Term Goals - 05/07/20 0921      PT LONG TERM GOAL #1   Title Pt will demo proper coordination with pelvic floor and no overuse of abs  to minimize leakage with walking, weight lifting and minimize risk for worsening of hernia    Time 4    Period Weeks    Status On-going      PT LONG TERM GOAL #2   Title Pt will report decreased straining with bowel movements from across 50% of the time to less than 25% of the time in order to minimzie blood in stools    Time 6    Period Weeks    Status Achieved      PT LONG TERM GOAL #3   Title Pt will demo proper technique with his fitness routine to minimize straining of pelvic floor mm and decrease leakage    Time 8    Period Weeks    Status On-going      PT LONG TERM GOAL #4   Title Pt will increase his FOTO score for urinary from 54 pts to > 60 pts in order to improve QOL    Time 8    Period Weeks    Status On-going      PT LONG TERM GOAL #5   Title Pt will demo decreased mm tightnes on R pelvic floor mm and increased RLE strength in order to progress to kegel strengthening exercises  with greater outcomes    Time 6    Period Weeks    Status New      PT LONG TERM GOAL #6   Title Pt demo equal shoulder height to minimize injuries with weight lifting and to optiomize diaphragm to progress toward pelvic floor exercises    Baseline R shoulder lower ( 20 cm earlobe to acromium), L 19 cm    Time  6    Period Weeks    Status New                 Plan - 05/07/20 4540    Clinical Impression Statement Progressed pt to quick contractions of pelvic floor in hooklying as pt demo'd decreased tightness of pelvic floor and improved diaphragmatic breathing. Pt was not ready for seated position with theses contractions as pt demo'd difficulty with diaphragmatic excursion in upright position. Pt also required cues for less activation of posterior mm and pelvic tilts. Pt continues to benefit from skilled PT. Plan to advance with squat technique and lat pull down, Assess lower kinetic chain at next session.    Examination-Activity Limitations Locomotion Level;Lift;Continence;Squat    Stability/Clinical Decision Making Stable/Uncomplicated    Rehab Potential Good    PT Frequency 1x / week    PT Duration Other (comment)   10   PT Treatment/Interventions Neuromuscular re-education;Balance training;Moist Heat;Functional mobility training;Therapeutic activities;Therapeutic exercise;Patient/family education;Manual techniques;Scar mobilization;Splinting;Taping;Biofeedback;Gait training;Stair training;Dry needling;Spinal Manipulations;Joint Manipulations    Consulted and Agree with Plan of Care Patient           Patient will benefit from skilled therapeutic intervention in order to improve the following deficits and impairments:  Decreased strength,Decreased endurance,Decreased balance,Increased muscle spasms,Improper body mechanics,Pain,Postural dysfunction,Hypomobility,Decreased coordination,Decreased mobility,Decreased safety awareness,Decreased activity tolerance,Increased fascial  restricitons,Abnormal gait  Visit Diagnosis: Other idiopathic scoliosis, lumbar region  Other muscle spasm  Other lack of coordination     Problem List Patient Active Problem List   Diagnosis Date Noted  . Personal history of colonic polyps   . S/P cervical spinal fusion 01/25/2018  . Prostate enlargement 02/23/2015  . DDD (degenerative disc disease), lumbar 01/18/2015  . Urinary frequency 01/17/2015  . Neck pain, chronic 01/17/2015  . History of adenomatous polyp of colon   . Arthritis 08/24/2014  . BPH (benign prostatic hypertrophy) 08/24/2014  . Erectile dysfunction 08/24/2014  . Peyronie's disease 06/28/2012  . Cervicalgia 07/12/2007  . Esophagitis, reflux 09/29/2005  . Cervical spondylosis without myelopathy 01/06/1998    Jerl Mina ,PT, DPT, E-RYT  05/07/2020, 11:56 AM  [place] MAIN [facility] SERVICES [address] [place], [place], [address] Phone: [phone]   Fax:  [phone]  Name: KOBEN DAMAN MRN: [id number] Date of Birth: 1955-08-04

## 2020-05-07 NOTE — Patient Instructions (Addendum)
PELVIC FLOOR / KEGEL EXERCISES   Pelvic floor/ Kegel exercises are used to strengthen the muscles in the base of your pelvis that are responsible for supporting your pelvic organs and preventing urine/feces leakage. Based on your therapist's recommendations, they can be performed while standing, sitting, or lying down.  Make yourself aware of this muscle group by using these cues:  Imagine you are in a crowded room and you feel the need to pass gas. Your response is to pull up and in at the rectum.  Close the rectum. Pull the muscles up inside your body,feeling your scrotum lifting as well . Feel the pelvic floor muscles lift as if you were walking into a cold lake.  Place your hand on top of your pubic bone. Tighten and draw in the muscles around the anal muscles without squeezing the buttock muscles.  Common Errors:  Breath holding: If you are holding your breath, you may be bearing down against your bladder instead of pulling it up. If you belly bulges up while you are squeezing, you are holding your breath. Be sure to breathe gently in and out while exercising. Counting out loud may help you avoid holding your breath.  Accessory muscle use: You should not see or feel other muscle movement when performing pelvic floor exercises. When done properly, no one can tell that you are performing the exercises. Keep the buttocks, belly and inner thighs relaxed.  Overdoing it: Your muscles can fatigue and stop working for you if you over-exercise. You may actually leak more or feel soreness at the lower abdomen or rectum.  YOUR HOME EXERCISE PROGRAM     SHORT HOLDS: Position: on back  Inhale and then exhale. Then squeeze the muscle.  (Be sure to let belly sink in with exhales and not push outward)  Perform 5 repetitions, 5  Times/day  **ALSO SQUEEZE BEFORE YOUR SNEEZE, COUGH, LAUGH to decrease downward pressure   **ALSO EXHALE BEFORE YOU RISE AGAINST GRAVITY (lifting, sit to stand, from  squat to stand)   __  ergonomic handout for work station [employer]

## 2020-05-14 ENCOUNTER — Ambulatory Visit: Payer: Medicare PPO | Admitting: Physical Therapy

## 2020-05-14 ENCOUNTER — Other Ambulatory Visit: Payer: Self-pay

## 2020-05-14 DIAGNOSIS — M4126 Other idiopathic scoliosis, lumbar region: Secondary | ICD-10-CM

## 2020-05-14 DIAGNOSIS — R278 Other lack of coordination: Secondary | ICD-10-CM

## 2020-05-14 DIAGNOSIS — M62838 Other muscle spasm: Secondary | ICD-10-CM | POA: Diagnosis not present

## 2020-05-14 NOTE — Patient Instructions (Addendum)
Feet care :  Self -feet massage   Handshake : fingers between toes, moving ballmounds/toes back and forth several times while other hand anchors at arch. Do the same at the hind/mid foot.  Heel to toes upward to a letter Big Letter T strokes to spread ballmounds and toes, several times, pinch between webs of toes  Run finger tips along top of foot between long bones "comb between the bones"    Wiggle toes and spread them out when relaxing   __    Feet slides :   Points of contact at sitting bones  Four points of contact of foot, Heel up, ankle not twist out Lower heel Four points of contact of foot, Slide foot back   Repeated with other foot    2 min ___  Ankle strengthening on L with band band wrapped around outer L side of foot ballmound of L foot pressing onto band , R foot is placed on top of band hip width apart, with the ballmound ,  R hand holds the band 30 reps swinging L pinky toe out to the L  X 2x day  ____    Duanne Guess WITH RESISTANCE BLUE Band at waist connected to doorknob 66mins Stepping forward normal length steps, planting mid and forefoot down, center of mass ( navel) leans forward slightly as if you were walking uphill 3-4 steps till band feels taut ( MAKE SURE THE DOOR IS LOCKED AND WON'T OPEN)   Stepping backwards, lower heel slowly, carry trunk and hips back , leaning forward, front knee along 2-3 rd toe line    3 min  ____  Heel raises  10 x 3 reps  Hand on wall

## 2020-05-14 NOTE — Therapy (Addendum)
[place] MAIN [facility] SERVICES [address] [place], [place], [address] Phone: [phone]   Fax:  [phone]  Physical Therapy Treatment  Patient Details  Name: Micheal Mccoy MRN: [id number] Date of Birth: 1955/07/22 Referring Provider (PT): Zara Council   Encounter Date: 05/14/2020   PT End of Session - 05/14/20 1003    Visit Number 6    Number of Visits 10    Date for PT Re-Evaluation 06/11/20    Authorization Type humana 12 visits from 04/12/20- 06/12/20  ( 3/12 )    PT Start Time 0900    PT Stop Time 1004    PT Time Calculation (min) 64 min    Activity Tolerance Patient tolerated treatment well    Behavior During Therapy [facility] for tasks assessed/performed           Past Medical History:  Diagnosis Date  . Arthritis   . Benign neoplasm of ascending colon   . Degenerative disc disease, cervical   . Esophagitis, reflux 09/29/2005  . Frequency of urination   . GERD (gastroesophageal reflux disease)   . Peyronie's disease    W/ PENILE CURVATURE    Past Surgical History:  Procedure Laterality Date  . ANTERIOR CERVICAL DECOMP/DISCECTOMY FUSION N/A 01/25/2018   Procedure: ANTERIOR CERVICAL DECOMPRESSION/DISCECTOMY FUSION 1 LEVEL C7-T1, T1-T2;  Surgeon: Deetta Perla, MD;  Location: [facility];  Service: Neurosurgery;  Laterality: N/A;  . ANTERIOR CERVICAL DISCECTOMY  01/25/2018   Dr. Lacinda Axon, ACDF ARTHRODESIS, OSTEOPHYTECTOMY &DECOMPRESSION OF SPINAL CORD AND/ORNERVE ROOTS; BELOW C2--C7-T1  . COLONOSCOPY WITH PROPOFOL N/A 09/14/2014   Procedure: COLONOSCOPY WITH PROPOFOL;  Surgeon: Lucilla Lame, MD;  Location: [place];  Service: Endoscopy;  Laterality: N/A;  . COLONOSCOPY WITH PROPOFOL N/A 01/31/2020   Procedure: COLONOSCOPY WITH PROPOFOL;  Surgeon: Lucilla Lame, MD;  Location: [facility];  Service: Endoscopy;  Laterality: N/A;  . HERNIA REPAIR Bilateral 04/09/2015   Bard 3D Max mesh for bilateral inguinal hernias, primary  repair of umbilical henria.   Marland Kitchen HOLEP-LASER ENUCLEATION OF THE PROSTATE WITH MORCELLATION N/A 02/17/2019   Procedure: HOLEP-LASER ENUCLEATION OF THE PROSTATE WITH MORCELLATION;  Surgeon: Billey Co, MD;  Location: [facility];  Service: Urology;  Laterality: N/A;  . INGUINAL HERNIA REPAIR Bilateral 04/09/2015   Procedure: LAPAROSCOPIC INGUINAL HERNIA;  Surgeon: Robert Bellow, MD;  Location: [facility];  Service: General;  Laterality: Bilateral;  . NESBIT PROCEDURE N/A 06/28/2012   Procedure: 16 DOT PLACTATION PROCEDURE;  Surgeon: Claybon Jabs, MD;  Location: [facility];  Service: Urology;  Laterality: N/A;  . Peyronies Disease  06/2012  . POLYPECTOMY  09/14/2014   Procedure: POLYPECTOMY;  Surgeon: Lucilla Lame, MD;  Location: [place];  Service: Endoscopy;;  . PROSTATE BIOPSY  03-02-15   neg  . THORACIC DISCECTOMY  01/25/2018   Dr. Lacinda Axon, ARTHRODESIS ANTERIOR INTERBODY W/DISCECTOMY ADDITIONAL LEVEL T1-T2  . UMBILICAL HERNIA REPAIR N/A 04/09/2015   Procedure: HERNIA REPAIR UMBILICAL ADULT;  Surgeon: Robert Bellow, MD;  Location: [facility];  Service: General;  Laterality: N/A;    There were no vitals filed for this visit.   Subjective Assessment - 05/14/20 0907    Subjective Pt has been trying to practice the exercises during the day. Pt is experiencing less leakage              [facility] PT Assessment - 05/14/20 0955      Observation/Other Assessments   Observations L digit I hallux valgus,  digit II distal metatarsal phalanges deviated laterally , toe gripping L      Other:   Other/ Comments L hip IR, knee valgus in lunges      AROM   Overall AROM Comments L hip abd /ER limited      Strength   Overall Strength Comments no UE support, L 3 reps, R 1 rep MMT 4-/5.   L DF/EV 3+/5, R 4/5      Palpation   Palpation comment hypomobile L midfoot joints, lacking DF/EV                         [facility] Adult PT Treatment/Exercise - 05/14/20 1000       Neuro Re-ed    Neuro Re-ed Details  cued for transvserse arch co-activation      Manual Therapy   Manual therapy comments STM/MWM PA/AP mob to increase mobiltiy at L midfoot and toe abduction                       PT Long Term Goals - 05/07/20 0921      PT LONG TERM GOAL #1   Title Pt will demo proper coordination with pelvic floor and no overuse of abs  to minimize leakage with walking, weight lifting and minimize risk for worsening of hernia    Time 4    Period Weeks    Status On-going      PT LONG TERM GOAL #2   Title Pt will report decreased straining with bowel movements from across 50% of the time to less than 25% of the time in order to minimzie blood in stools    Time 6    Period Weeks    Status Achieved      PT LONG TERM GOAL #3   Title Pt will demo proper technique with his fitness routine to minimize straining of pelvic floor mm and decrease leakage    Time 8    Period Weeks    Status On-going      PT LONG TERM GOAL #4   Title Pt will increase his FOTO score for urinary from 54 pts to > 60 pts in order to improve QOL    Time 8    Period Weeks    Status On-going      PT LONG TERM GOAL #5   Title Pt will demo decreased mm tightnes on R pelvic floor mm and increased RLE strength in order to progress to kegel strengthening exercises with greater outcomes    Time 6    Period Weeks    Status New      PT LONG TERM GOAL #6   Title Pt demo equal shoulder height to minimize injuries with weight lifting and to optiomize diaphragm to progress toward pelvic floor exercises    Baseline R shoulder lower ( 20 cm earlobe to acromium), L 19 cm    Time 6    Period Weeks    Status New                 Plan - 05/14/20 1005    Clinical Impression Statement Discussed pt's past workout routine.    Applied regional interdependent approach today with manual Tx to release mm tightness in intrinsic feet mm  and provided excessive cuing to promote optimal  transverse arch and correct gait /standing deviations:  medial arch collapse/pronation Deviated digit I-II on L foot Minimal toe abduction minimal push  off  toe gripping weakness in PF strength standing and required UE support.     Pt will benefit from a regional interdependent approach to optimize pelvic girdle stability with optimal lower kinetic chain chain mobility and co-activation in upright and against gravity tasks to minimize load on IAP system which will help with regaining continence.   Pt continues to benefit from skilled PT     Examination-Activity Limitations Locomotion Level;Lift;Continence;Squat    Stability/Clinical Decision Making Stable/Uncomplicated    Rehab Potential Good    PT Frequency 1x / week    PT Duration Other (comment)   10   PT Treatment/Interventions Neuromuscular re-education;Balance training;Moist Heat;Functional mobility training;Therapeutic activities;Therapeutic exercise;Patient/family education;Manual techniques;Scar mobilization;Splinting;Taping;Biofeedback;Gait training;Stair training;Dry needling;Spinal Manipulations;Joint Manipulations    Consulted and Agree with Plan of Care Patient           Patient will benefit from skilled therapeutic intervention in order to improve the following deficits and impairments:  Decreased strength,Decreased endurance,Decreased balance,Increased muscle spasms,Improper body mechanics,Pain,Postural dysfunction,Hypomobility,Decreased coordination,Decreased mobility,Decreased safety awareness,Decreased activity tolerance,Increased fascial restricitons,Abnormal gait  Visit Diagnosis: Other idiopathic scoliosis, lumbar region  Other muscle spasm  Other lack of coordination     Problem List Patient Active Problem List   Diagnosis Date Noted  . Personal history of colonic polyps   . S/P cervical spinal fusion 01/25/2018  . Prostate enlargement 02/23/2015  . DDD (degenerative disc disease), lumbar 01/18/2015   . Urinary frequency 01/17/2015  . Neck pain, chronic 01/17/2015  . History of adenomatous polyp of colon   . Arthritis 08/24/2014  . BPH (benign prostatic hypertrophy) 08/24/2014  . Erectile dysfunction 08/24/2014  . Peyronie's disease 06/28/2012  . Cervicalgia 07/12/2007  . Esophagitis, reflux 09/29/2005  . Cervical spondylosis without myelopathy 01/06/1998    Jerl Mina ,PT, DPT, E-RYT  05/14/2020, 10:27 AM  [place] MAIN [facility] SERVICES [address] [place], [place], [address] Phone: [phone]   Fax:  [phone]  Name: BJ MORLOCK MRN: [id number] Date of Birth: 01-07-1955

## 2020-05-21 ENCOUNTER — Ambulatory Visit: Payer: Medicare PPO | Admitting: Physical Therapy

## 2020-05-22 ENCOUNTER — Encounter: Payer: Self-pay | Admitting: Family Medicine

## 2020-05-22 ENCOUNTER — Other Ambulatory Visit: Payer: Self-pay

## 2020-05-22 ENCOUNTER — Ambulatory Visit (INDEPENDENT_AMBULATORY_CARE_PROVIDER_SITE_OTHER): Payer: Medicare PPO | Admitting: Family Medicine

## 2020-05-22 VITALS — BP 135/79 | HR 53 | Temp 98.4°F | Resp 18 | Wt 192.6 lb

## 2020-05-22 DIAGNOSIS — K921 Melena: Secondary | ICD-10-CM

## 2020-05-22 NOTE — Progress Notes (Signed)
      Established patient visit   Patient: Micheal Mccoy   DOB: December 19, 1955   65 y.o. Male  MRN: [id number] Visit Date: 05/22/2020  Today's healthcare provider: Lelon Huh, MD   Chief Complaint  Patient presents with  . Rectal Bleeding   Subjective    HPI  Patient presents to discuss blood in stool that was noticed on 04/23/2020,  05/06/2020 and 05/15/2020. He states each episode there was a large around of bright red blood in the toilet bowl. He denies any abdominal pain, dizziness or lightheadedness. He was seen for the same thing in march with negative Hemoccult test at that time. He has since been taking fiber daily and has not had any trouble with constipation or straining with BM. He did have colonoscopy in January which was completely normal. Denies any any red colored foods or red dyes.         Medications: Outpatient Medications Prior to Visit  Medication Sig  . tadalafil (CIALIS) 20 MG tablet Take 1 tablet (20 mg total) by mouth daily. Or take every other day for ED   No facility-administered medications prior to visit.    Review of Systems  Constitutional: Negative for appetite change, chills and fever.  Respiratory: Negative for chest tightness, shortness of breath and wheezing.   Cardiovascular: Negative for chest pain and palpitations.  Gastrointestinal: Positive for blood in stool. Negative for abdominal pain, nausea and vomiting.       Objective    BP 135/79 (BP Location: Left Arm, Patient Position: Sitting, Cuff Size: Normal)   Pulse (!) 53   Temp 98.4 F (36.9 C) (Temporal)   Resp 18   Wt 192 lb 9.6 oz (87.4 kg)   BMI 24.07 kg/m     Physical Exam   No anal or rectal lesions. Brown stool present. Hemoccult negative.    Assessment & Plan     1. Blood in stool, frank One week since last occurrence. Does not seem to have any issue with constipation or anal pain. Consider he had normal colonoscopy in January I think we need to demonstrate that  this actually blood. Sent with FIT collection vial to collect sample next time he thinks there is blood in stool.       The entirety of the information documented in the History of Present Illness, Review of Systems and Physical Exam were personally obtained by me. Portions of this information were initially documented by the CMA and reviewed by me for thoroughness and accuracy.      Lelon Huh, MD  [facility] [phone] (phone) [phone] (fax)  [place]

## 2020-05-28 ENCOUNTER — Ambulatory Visit: Payer: Medicare PPO | Admitting: Physical Therapy

## 2020-06-05 ENCOUNTER — Ambulatory Visit: Payer: Medicare PPO | Admitting: Physical Therapy

## 2020-06-05 ENCOUNTER — Other Ambulatory Visit: Payer: Self-pay

## 2020-06-05 DIAGNOSIS — M4126 Other idiopathic scoliosis, lumbar region: Secondary | ICD-10-CM | POA: Diagnosis not present

## 2020-06-05 DIAGNOSIS — R278 Other lack of coordination: Secondary | ICD-10-CM

## 2020-06-05 DIAGNOSIS — M62838 Other muscle spasm: Secondary | ICD-10-CM | POA: Diagnosis not present

## 2020-06-05 NOTE — Patient Instructions (Signed)
1) Deep core level 1 ( 10 breaths)    2) Added long endurance pelvic floor contractions  Inhale, exhale, squeeze, count 3 sec  aloud   3 rest breaths   Then another squeeze for 3 sec hold  Total 4 reps    3) deep core level 2 ( 6 min)    4)  Added long endurance pelvic floor contractions  Inhale, exhale, squeeze, count 3 sec  aloud   3 rest breaths   Then another squeeze for 3 sec hold  Total 4 reps    5)  Clam Shell 45 Degrees  LEFT SIDE   Lying with hips and knees bent 45, one pillow between knees and ankles. Heel together, toes apart like ballerina,  Lift knee with exhale while pressing heels together. Be sure pelvis does not roll backward. Do not arch back. Do 20 times   Complimentary stretch: Figure-4 but turned 45 deg to the chair   Cross _ foot over _ thigh, opposite knee straight  3 breaths

## 2020-06-05 NOTE — Therapy (Signed)
[place] MAIN [facility] SERVICES [address] [place], [place], [address] Phone: [phone]   Fax:  [phone]  Physical Therapy Treatment  Patient Details  Name: Micheal Mccoy MRN: [id number] Date of Birth: June 26, 1955 Referring Provider (PT): Zara Council   Encounter Date: 06/05/2020   PT End of Session - 06/05/20 1509    Visit Number 7    Number of Visits 10    Date for PT Re-Evaluation 06/11/20    Authorization Type humana 12 visits from 04/12/20- 06/12/20  ( 3/12 )    PT Start Time 1506    PT Stop Time 1600    PT Time Calculation (min) 54 min    Activity Tolerance Patient tolerated treatment well    Behavior During Therapy [facility] for tasks assessed/performed           Past Medical History:  Diagnosis Date  . Arthritis   . Benign neoplasm of ascending colon   . Degenerative disc disease, cervical   . Esophagitis, reflux 09/29/2005  . Frequency of urination   . GERD (gastroesophageal reflux disease)   . Peyronie's disease    W/ PENILE CURVATURE    Past Surgical History:  Procedure Laterality Date  . ANTERIOR CERVICAL DECOMP/DISCECTOMY FUSION N/A 01/25/2018   Procedure: ANTERIOR CERVICAL DECOMPRESSION/DISCECTOMY FUSION 1 LEVEL C7-T1, T1-T2;  Surgeon: Deetta Perla, MD;  Location: [facility] ORS;  Service: Neurosurgery;  Laterality: N/A;  . ANTERIOR CERVICAL DISCECTOMY  01/25/2018   Dr. Lacinda Axon, ACDF ARTHRODESIS, OSTEOPHYTECTOMY &DECOMPRESSION OF SPINAL CORD AND/ORNERVE ROOTS; BELOW C2--C7-T1  . COLONOSCOPY WITH PROPOFOL N/A 09/14/2014   Procedure: COLONOSCOPY WITH PROPOFOL;  Surgeon: Lucilla Lame, MD;  Location: [place];  Service: Endoscopy;  Laterality: N/A;  . COLONOSCOPY WITH PROPOFOL N/A 01/31/2020   Procedure: COLONOSCOPY WITH PROPOFOL;  Surgeon: Lucilla Lame, MD;  Location: [facility] ENDOSCOPY;  Service: Endoscopy;  Laterality: N/A;  . HERNIA REPAIR Bilateral 04/09/2015   Bard 3D Max mesh for bilateral inguinal hernias, primary  repair of umbilical henria.   Marland Kitchen HOLEP-LASER ENUCLEATION OF THE PROSTATE WITH MORCELLATION N/A 02/17/2019   Procedure: HOLEP-LASER ENUCLEATION OF THE PROSTATE WITH MORCELLATION;  Surgeon: Billey Co, MD;  Location: [facility] ORS;  Service: Urology;  Laterality: N/A;  . INGUINAL HERNIA REPAIR Bilateral 04/09/2015   Procedure: LAPAROSCOPIC INGUINAL HERNIA;  Surgeon: Robert Bellow, MD;  Location: [facility] ORS;  Service: General;  Laterality: Bilateral;  . NESBIT PROCEDURE N/A 06/28/2012   Procedure: 16 DOT PLACTATION PROCEDURE;  Surgeon: Claybon Jabs, MD;  Location: [facility];  Service: Urology;  Laterality: N/A;  . Peyronies Disease  06/2012  . POLYPECTOMY  09/14/2014   Procedure: POLYPECTOMY;  Surgeon: Lucilla Lame, MD;  Location: [place];  Service: Endoscopy;;  . PROSTATE BIOPSY  03-02-15   neg  . THORACIC DISCECTOMY  01/25/2018   Dr. Lacinda Axon, ARTHRODESIS ANTERIOR INTERBODY W/DISCECTOMY ADDITIONAL LEVEL T1-T2  . UMBILICAL HERNIA REPAIR N/A 04/09/2015   Procedure: HERNIA REPAIR UMBILICAL ADULT;  Surgeon: Robert Bellow, MD;  Location: [facility] ORS;  Service: General;  Laterality: N/A;    There were no vitals filed for this visit.   Subjective Assessment - 06/05/20 1510    Subjective Pt reported he has not been doing his pelvic floor exercises.No changes with leakage. Pt has noticed blood in his stool and has told his MD. He had a colonoscopy ealrier this year. Pt was told to take a sample of his stool next time he has blood in his  stool. Pt has raised his laptop higher and it has made a difference with his posture.              [facility] PT Assessment - 06/05/20 1539      Strength   Overall Strength Comments L hip abd 3/5, R 5/5                      Pelvic Floor Special Questions - 06/05/20 1524    External Palpation 3 sec, 4 reps in hooklying before fatigue             [facility] Adult PT Treatment/Exercise - 06/05/20 1644      Therapeutic Activites     Other Therapeutic Activities discussed progression of pelvic floor mm with endurance training, sequencing into HEP to minimize overwhelm and to regain compliance      Neuro Re-ed    Neuro Re-ed Details  cued for pelvic floor long holds , reviewed past HEP                       PT Long Term Goals - 05/07/20 4970      PT LONG TERM GOAL #1   Title Pt will demo proper coordination with pelvic floor and no overuse of abs  to minimize leakage with walking, weight lifting and minimize risk for worsening of hernia    Time 4    Period Weeks    Status On-going      PT LONG TERM GOAL #2   Title Pt will report decreased straining with bowel movements from across 50% of the time to less than 25% of the time in order to minimzie blood in stools    Time 6    Period Weeks    Status Achieved      PT LONG TERM GOAL #3   Title Pt will demo proper technique with his fitness routine to minimize straining of pelvic floor mm and decrease leakage    Time 8    Period Weeks    Status On-going      PT LONG TERM GOAL #4   Title Pt will increase his FOTO score for urinary from 54 pts to > 60 pts in order to improve QOL    Time 8    Period Weeks    Status On-going      PT LONG TERM GOAL #5   Title Pt will demo decreased mm tightnes on R pelvic floor mm and increased RLE strength in order to progress to kegel strengthening exercises with greater outcomes    Time 6    Period Weeks    Status New      PT LONG TERM GOAL #6   Title Pt demo equal shoulder height to minimize injuries with weight lifting and to optiomize diaphragm to progress toward pelvic floor exercises    Baseline R shoulder lower ( 20 cm earlobe to acromium), L 19 cm    Time 6    Period Weeks    Status New                 Plan - 06/05/20 1509    Clinical Impression Statement Pt advanced to endurance pelvic floor mm training as pt demo'd no more L pelvic floor mm tightness. Pt achieved 3s sec holds, 4 reps with cues  for proper lengthening in between reps. Initiated L hip abduction strengthening as this side was weaker than R.  Pt is maintaining  past improvements despite skipping PT for 2 weeks.  Pt require no more cue for posture as pt has maintained more upright posture and aligned spine with no more uneven shoulder/ pelvic height.  Pt continues to benefit from skilled PT.    Examination-Activity Limitations Locomotion Level;Lift;Continence;Squat    Stability/Clinical Decision Making Stable/Uncomplicated    Rehab Potential Good    PT Frequency 1x / week    PT Duration Other (comment)   10   PT Treatment/Interventions Neuromuscular re-education;Balance training;Moist Heat;Functional mobility training;Therapeutic activities;Therapeutic exercise;Patient/family education;Manual techniques;Scar mobilization;Splinting;Taping;Biofeedback;Gait training;Stair training;Dry needling;Spinal Manipulations;Joint Manipulations    Consulted and Agree with Plan of Care Patient           Patient will benefit from skilled therapeutic intervention in order to improve the following deficits and impairments:  Decreased strength,Decreased endurance,Decreased balance,Increased muscle spasms,Improper body mechanics,Pain,Postural dysfunction,Hypomobility,Decreased coordination,Decreased mobility,Decreased safety awareness,Decreased activity tolerance,Increased fascial restricitons,Abnormal gait  Visit Diagnosis: Other idiopathic scoliosis, lumbar region  Other muscle spasm  Other lack of coordination     Problem List Patient Active Problem List   Diagnosis Date Noted  . Personal history of colonic polyps   . S/P cervical spinal fusion 01/25/2018  . Prostate enlargement 02/23/2015  . DDD (degenerative disc disease), lumbar 01/18/2015  . Urinary frequency 01/17/2015  . Neck pain, chronic 01/17/2015  . History of adenomatous polyp of colon   . Arthritis 08/24/2014  . BPH (benign prostatic hypertrophy) 08/24/2014  .  Erectile dysfunction 08/24/2014  . Peyronie's disease 06/28/2012  . Cervicalgia 07/12/2007  . Esophagitis, reflux 09/29/2005  . Cervical spondylosis without myelopathy 01/06/1998    Jerl Mina ,PT, DPT, E-RYT  06/05/2020, 4:45 PM  [place] MAIN [facility] SERVICES [address] [place], [place], [address] Phone: [phone]   Fax:  [phone]  Name: NORTH ESTERLINE MRN: [id number] Date of Birth: 1955-12-19

## 2020-06-11 ENCOUNTER — Ambulatory Visit: Payer: Medicare PPO | Admitting: Physical Therapy

## 2020-06-12 ENCOUNTER — Telehealth (INDEPENDENT_AMBULATORY_CARE_PROVIDER_SITE_OTHER): Payer: Medicare PPO

## 2020-06-12 DIAGNOSIS — K921 Melena: Secondary | ICD-10-CM | POA: Diagnosis not present

## 2020-06-12 LAB — IFOBT (OCCULT BLOOD): IFOBT: POSITIVE

## 2020-06-12 NOTE — Telephone Encounter (Signed)
Pt dropped off an OC light.  It showed positive for blood.    Thanks,   -Mickel Baas

## 2020-06-13 ENCOUNTER — Telehealth: Payer: Self-pay

## 2020-06-13 DIAGNOSIS — K921 Melena: Secondary | ICD-10-CM

## 2020-06-13 NOTE — Telephone Encounter (Signed)
Patient advised and agrees to referral. Please schedule.

## 2020-06-13 NOTE — Telephone Encounter (Signed)
-----   Message from [name], MD sent at 06/12/2020  5:11 PM EDT ----- Test is positive for blood. He needs referral back to GI to evaluated further.

## 2020-06-14 ENCOUNTER — Other Ambulatory Visit: Payer: Self-pay

## 2020-06-15 ENCOUNTER — Encounter: Payer: Medicare PPO | Admitting: Physical Therapy

## 2020-06-17 NOTE — Progress Notes (Signed)
Primary Care Physician: [name], MD  Primary Gastroenterologist:  Dr. Lucilla Lame  Chief Complaint  Patient presents with  . Blood In Stools    HPI: Micheal Mccoy is a 65 y.o. male here with a report of rectal bleeding.  The patient had a colonoscopy by me in the January of this year for high risk screening.  The patient now comes with rectal bleeding.  The patient had reported multiple episodes of rectal bleeding on 04/23/2020,  05/06/2020 and 05/15/2020 with each time being a large amount of rectal bleeding.  During 2 of the episodes a fecal occult stool test was sent off for an unknown reason and 1 was negative and the most recent one was positive.  He states that the last time he had a rectal bleeding he was also bleeding from his penis and was not sure that these 2 were related or not.  He has had no further bleeding from his penis and states that he felt to tell his primary care provider about it.  There is no report of any unexplained weight loss fevers chills nausea or vomiting.  The patient's colonoscopy pictures were reviewed and did show some small internal hemorrhoids.   Past Medical History:  Diagnosis Date  . Arthritis   . Benign neoplasm of ascending colon   . Degenerative disc disease, cervical   . Esophagitis, reflux 09/29/2005  . Frequency of urination   . GERD (gastroesophageal reflux disease)   . Peyronie's disease    W/ PENILE CURVATURE    Current Outpatient Medications  Medication Sig Dispense Refill  . tadalafil (CIALIS) 20 MG tablet Take 1 tablet (20 mg total) by mouth daily. Or take every other day for ED 90 tablet 3   No current facility-administered medications for this visit.    Allergies as of 06/18/2020 - Review Complete 06/18/2020  Allergen Reaction Noted  . Aleve [naproxen] Hives 03/07/2019    ROS:  General: Negative for anorexia, weight loss, fever, chills, fatigue, weakness. ENT: Negative for hoarseness, difficulty swallowing ,  nasal congestion. CV: Negative for chest pain, angina, palpitations, dyspnea on exertion, peripheral edema.  Respiratory: Negative for dyspnea at rest, dyspnea on exertion, cough, sputum, wheezing.  GI: See history of present illness. GU:  Negative for dysuria, hematuria, urinary incontinence, urinary frequency, nocturnal urination.  Endo: Negative for unusual weight change.    Physical Examination:   BP 126/74   Pulse 76   Temp 98 F (36.7 C) (Temporal)   Ht 6\' 3"  (1.905 m)   Wt 188 lb 12.8 oz (85.6 kg)   BMI 23.60 kg/m   General: Well-nourished, well-developed in no acute distress.  Eyes: No icterus. Conjunctivae pink. Lungs: Clear to auscultation bilaterally. Non-labored. Neuro: Alert and oriented x 3.  Grossly intact. Skin: Warm and dry, no jaundice.   Psych: Alert and cooperative, normal mood and affect.  Labs:    Imaging Studies: No results found.  Assessment and Plan:   Micheal Mccoy is a 65 y.o. y/o male who comes in today with a history of rectal bleeding with a colonoscopy little over 5 months ago that did not show any worrisome features but there was some hemorrhoids.  The patient will be set up for hemorrhoidal banding to see if this stops his bleeding.  The patient has also been in told that his bleeding from his penis should be addressed with his primary care provider and I have sent his primary care provider a message.  If the bleeding should continue after hemorrhoidal band ligation then the patient may need further investigation.  The patient has been explained the plan and agrees with it.     Lucilla Lame, MD. Marval Regal    Note: This dictation was prepared with Dragon dictation along with smaller phrase technology. Any transcriptional errors that result from this process are unintentional.

## 2020-06-18 ENCOUNTER — Other Ambulatory Visit: Payer: Self-pay

## 2020-06-18 ENCOUNTER — Encounter: Payer: Self-pay | Admitting: Gastroenterology

## 2020-06-18 ENCOUNTER — Ambulatory Visit (INDEPENDENT_AMBULATORY_CARE_PROVIDER_SITE_OTHER): Payer: Medicare PPO | Admitting: Gastroenterology

## 2020-06-18 VITALS — BP 126/74 | HR 76 | Temp 98.0°F | Ht 75.0 in | Wt 188.8 lb

## 2020-06-18 DIAGNOSIS — K921 Melena: Secondary | ICD-10-CM | POA: Diagnosis not present

## 2020-06-25 ENCOUNTER — Ambulatory Visit: Payer: Medicare PPO | Admitting: Physical Therapy

## 2020-07-02 ENCOUNTER — Ambulatory Visit: Payer: Medicare PPO | Admitting: Physical Therapy

## 2020-07-30 DIAGNOSIS — Z03818 Encounter for observation for suspected exposure to other biological agents ruled out: Secondary | ICD-10-CM | POA: Diagnosis not present

## 2020-08-09 ENCOUNTER — Ambulatory Visit: Payer: Medicare PPO | Admitting: Gastroenterology

## 2020-08-09 ENCOUNTER — Telehealth: Payer: Self-pay | Admitting: Gastroenterology

## 2020-08-09 NOTE — Telephone Encounter (Signed)
Patient couldn't find our office and stated on the voicemail that a nurse told him that we had moved. I returned patient's phone call & Patient wants to reschedule appt for next week, when he's in town. I informed Patient of Dr. Georgeann Oppenheim next available appt. Patient stated that he would leave a voice message on CMA's phone to see if he could come in next week. Clinical staff will follow up with patient.

## 2020-08-09 NOTE — Progress Notes (Deleted)
    Jonathon Bellows MD, MRCP(U.K) [address]  [place]  [place], [place] [address]  Main: [phone]  Fax: [phone]   Primary Care Physician: [name], MD  Primary Gastroenterologist:  Dr. Jonathon Bellows   Chief complaint here to see me for banding of internal hemorrhoids   HPI: Micheal Mccoy is a 65 y.o. male is a patient of Dr. Trenton Gammon with a history of rectal bleeding and a colonoscopy 5 months back did not show any worrisome features except for internal hemorrhoids.  Current Outpatient Medications  Medication Sig Dispense Refill   tadalafil (CIALIS) 20 MG tablet Take 1 tablet (20 mg total) by mouth daily. Or take every other day for ED 90 tablet 3   No current facility-administered medications for this visit.    Allergies as of 08/09/2020 - Review Complete 06/18/2020  Allergen Reaction Noted   Aleve [naproxen] Hives 03/07/2019    ROS:  General: Negative for anorexia, weight loss, fever, chills, fatigue, weakness. ENT: Negative for hoarseness, difficulty swallowing , nasal congestion. CV: Negative for chest pain, angina, palpitations, dyspnea on exertion, peripheral edema.  Respiratory: Negative for dyspnea at rest, dyspnea on exertion, cough, sputum, wheezing.  GI: See history of present illness. GU:  Negative for dysuria, hematuria, urinary incontinence, urinary frequency, nocturnal urination.  Endo: Negative for unusual weight change.    Physical Examination:   There were no vitals taken for this visit.  General: Well-nourished, well-developed in no acute distress.  Eyes: No icterus. Conjunctivae pink. Mouth: Oropharyngeal mucosa moist and pink , no lesions erythema or exudate. Lungs: Clear to auscultation bilaterally. Non-labored. Heart: Regular rate and rhythm, no murmurs rubs or gallops.  Abdomen: Bowel sounds are normal, nontender, nondistended, no hepatosplenomegaly or masses, no abdominal bruits or hernia , no rebound or guarding.   Extremities:  No lower extremity edema. No clubbing or deformities. Neuro: Alert and oriented x 3.  Grossly intact. Skin: Warm and dry, no jaundice.   Psych: Alert and cooperative, normal mood and affect.   Imaging Studies: No results found.  Assessment and Plan:   Micheal Mccoy is a 65 y.o. y/o male here today to discuss treatment for internal hemorrhoids   Dr Jonathon Bellows  MD,MRCP [facility]) Follow up in ***

## 2020-08-13 NOTE — Telephone Encounter (Signed)
Patient was contacted and he agreed to be double booked and be seen on 08/20/2020 at 1:30 PM with Dr. Vicente Males for his first hemorrhoid banding.

## 2020-08-20 ENCOUNTER — Ambulatory Visit (INDEPENDENT_AMBULATORY_CARE_PROVIDER_SITE_OTHER): Payer: Medicare PPO | Admitting: Gastroenterology

## 2020-08-20 ENCOUNTER — Encounter: Payer: Self-pay | Admitting: Gastroenterology

## 2020-08-20 ENCOUNTER — Other Ambulatory Visit: Payer: Self-pay

## 2020-08-20 VITALS — BP 156/87 | HR 57 | Temp 97.9°F | Ht 75.0 in | Wt 182.6 lb

## 2020-08-20 DIAGNOSIS — K648 Other hemorrhoids: Secondary | ICD-10-CM

## 2020-08-20 NOTE — Progress Notes (Signed)
Patient follow-ups today for banding of hemorrhoids    Summary of history :   Patient of Dr Allen Norris , h/o rectal bleeding . Colonoscopy showed small internal hemorrhoids. He has been on a high-fiber diet still has issues with perianal itching and occasional bleeding denies any constipation.  Discussed options about conservative treatment which she has tried versus proceeding with hemorrhoidal banding and he wishes to proceed with hemorrhoidal banding after examination    Digital rectal exam performed in the presence of a chaperone. External anal findings: Normal Internal findings: , No masses, no blood on glove noticed.    PROCEDURE NOTE: The patient presents with symptomatic grade 2 hemorrhoids, unresponsive to maximal medical therapy, requesting rubber band ligation of his/her hemorrhoidal disease.  All risks, benefits and alternative forms of therapy were described and informed consent was obtained.  In the Left Lateral Decubitus position (if anoscopy is performed) anoscopic examination revealed grade 1 hemorrhoids in the all position(s).   The decision was made to band the LL internal hemorrhoid, and the Fielding was used to perform band ligation without complication.  Digital anorectal examination was then performed to assure proper positioning of the band, and to adjust the banded tissue as required.  The patient was discharged home without pain or other issues.  Dietary and behavioral recommendations were given and (if necessary - prescriptions were given), along with follow-up instructions.  The patient will return 4 weeks for follow-up and possible additional banding as required.  No complications were encountered and the patient tolerated the procedure well.   Plan:  Avoid constipation.  Commence on stool softeners if not already on  Follow-up: 4 weeks  Dr Jonathon Bellows MD,MRCP [facility]) Gastroenterology/Hepatology Pager: [phone]

## 2020-08-20 NOTE — Patient Instructions (Signed)
High-Fiber Eating Plan Fiber, also called dietary fiber, is a type of carbohydrate. It is found foods such as fruits, vegetables, whole grains, and beans. A high-fiber diet can have many health benefits. Your health care provider may recommend a high-fiber diet to help: Prevent constipation. Fiber can make your bowel movements more regular. Lower your cholesterol. Relieve the following conditions: Inflammation of veins in the anus (hemorrhoids). Inflammation of specific areas of the digestive tract (uncomplicated diverticulosis). A problem of the large intestine, also called the colon, that sometimes causes pain and diarrhea (irritable bowel syndrome, or IBS). Prevent overeating as part of a weight-loss plan. Prevent heart disease, type 2 diabetes, and certain cancers. What are tips for following this plan? Reading food labels  Check the nutrition facts label on food products for the amount of dietary fiber. Choose foods that have 5 grams of fiber or more per serving. The goals for recommended daily fiber intake include: Men (age 68 or younger): 34-38 g. Men (over age 47): 28-34 g. Women (age 12 or younger): 25-28 g. Women (over age 81): 22-25 g. Your daily fiber goal is _____________ g. Shopping Choose whole fruits and vegetables instead of processed forms, such as apple juice or applesauce. Choose a wide variety of high-fiber foods such as avocados, lentils, oats, and kidney beans. Read the nutrition facts label of the foods you choose. Be aware of foods with added fiber. These foods often have high sugar and sodium amounts per serving. Cooking Use whole-grain flour for baking and cooking. Cook with brown rice instead of white rice. Meal planning Start the day with a breakfast that is high in fiber, such as a cereal that contains 5 g of fiber or more per serving. Eat breads and cereals that are made with whole-grain flour instead of refined flour or white flour. Eat brown rice, bulgur  wheat, or millet instead of white rice. Use beans in place of meat in soups, salads, and pasta dishes. Be sure that half of the grains you eat each day are whole grains. General information You can get the recommended daily intake of dietary fiber by: Eating a variety of fruits, vegetables, grains, nuts, and beans. Taking a fiber supplement if you are not able to take in enough fiber in your diet. It is better to get fiber through food than from a supplement. Gradually increase how much fiber you consume. If you increase your intake of dietary fiber too quickly, you may have bloating, cramping, or gas. Drink plenty of water to help you digest fiber. Choose high-fiber snacks, such as berries, raw vegetables, nuts, and popcorn. What foods should I eat? Fruits Berries. Pears. Apples. Oranges. Avocado. Prunes and raisins. Dried figs. Vegetables Sweet potatoes. Spinach. Kale. Artichokes. Cabbage. Broccoli. Cauliflower.Green peas. Carrots. Squash. Grains Whole-grain breads. Multigrain cereal. Oats and oatmeal. Brown rice. Barley.Bulgur wheat. Moonshine. Quinoa. Bran muffins. Popcorn. Rye wafer crackers. Meats and other proteins Navy beans, kidney beans, and pinto beans. Soybeans. Split peas. Lentils. Nutsand seeds. Dairy Fiber-fortified yogurt. Beverages Fiber-fortified soy milk. Fiber-fortified orange juice. Other foods Fiber bars. The items listed above may not be a complete list of recommended foods and beverages. Contact a dietitian for more information. What foods should I avoid? Fruits Fruit juice. Cooked, strained fruit. Vegetables Fried potatoes. Canned vegetables. Well-cooked vegetables. Grains White bread. Pasta made with refined flour. White rice. Meats and other proteins Fatty cuts of meat. Fried chicken or fried fish. Dairy Milk. Yogurt. Cream cheese. Sour cream. Fats and oils Butters. Beverages  Soft drinks. Other foods Cakes and pastries. The items listed above may not  be a complete list of foods and beverages to avoid. Talk with your dietitian about what choices are best for you. Summary Fiber is a type of carbohydrate. It is found in foods such as fruits, vegetables, whole grains, and beans. A high-fiber diet has many benefits. It can help to prevent constipation, lower blood cholesterol, aid weight loss, and reduce your risk of heart disease, diabetes, and certain cancers. Increase your intake of fiber gradually. Increasing fiber too quickly may cause cramping, bloating, and gas. Drink plenty of water while you increase the amount of fiber you consume. The best sources of fiber include whole fruits and vegetables, whole grains, nuts, seeds, and beans. This information is not intended to replace advice given to you by your health care provider. Make sure you discuss any questions you have with your healthcare provider. Document Revised: 04/28/2019 Document Reviewed: 04/28/2019 Elsevier Patient Education  2022 [place].  Hemorrhoids Hemorrhoids are swollen veins that may develop: In the butt (rectum). These are called internal hemorrhoids. Around the opening of the butt (anus). These are called external hemorrhoids. Hemorrhoids can cause pain, itching, or bleeding. Most of the time, they do not cause serious problems. They usually get better with diet changes, lifestylechanges, and other home treatments. What are the causes? This condition may be caused by: Having trouble pooping (constipation). Pushing hard (straining) to poop. Watery poop (diarrhea). Pregnancy. Being very overweight (obese). Sitting for long periods of time. Heavy lifting or other activity that causes you to strain. Anal sex. Riding a bike for a long period of time. What are the signs or symptoms? Symptoms of this condition include: Pain. Itching or soreness in the butt. Bleeding from the butt. Leaking poop. Swelling in the area. One or more lumps around the opening of your  butt. How is this diagnosed? A doctor can often diagnose this condition by looking at the affected area. The doctor may also: Do an exam that involves feeling the area with a gloved hand (digital rectal exam). Examine the area inside your butt using a small tube (anoscope). Order blood tests. This may be done if you have lost a lot of blood. Have you get a test that involves looking inside the colon using a flexible tube with a camera on the end (sigmoidoscopy or colonoscopy). How is this treated? This condition can usually be treated at home. Your doctor may tell you to change what you eat, make lifestyle changes, or try home treatments. If these do not help, procedures can be done to remove the hemorrhoids or make them smaller. These may involve: Placing rubber bands at the base of the hemorrhoids to cut off their blood supply. Injecting medicine into the hemorrhoids to shrink them. Shining a type of light energy onto the hemorrhoids to cause them to fall off. Doing surgery to remove the hemorrhoids or cut off their blood supply. Follow these instructions at home: Eating and drinking  Eat foods that have a lot of fiber in them. These include whole grains, beans, nuts, fruits, and vegetables. Ask your doctor about taking products that have added fiber (fibersupplements). Reduce the amount of fat in your diet. You can do this by: Eating low-fat dairy products. Eating less red meat. Avoiding processed foods. Drink enough fluid to keep your pee (urine) pale yellow.  Managing pain and swelling  Take a warm-water bath (sitz bath) for 20 minutes to ease pain. Do this  3-4 times a day. You may do this in a bathtub or using a portable sitz bath that fits over the toilet. If told, put ice on the painful area. It may be helpful to use ice between your warm baths. Put ice in a plastic bag. Place a towel between your skin and the bag. Leave the ice on for 20 minutes, 2-3 times a day.  General  instructions Take over-the-counter and prescription medicines only as told by your doctor. Medicated creams and medicines may be used as told. Exercise often. Ask your doctor how much and what kind of exercise is best for you. Go to the bathroom when you have the urge to poop. Do not wait. Avoid pushing too hard when you poop. Keep your butt dry and clean. Use wet toilet paper or moist towelettes after pooping. Do not sit on the toilet for a long time. Keep all follow-up visits as told by your doctor. This is important. Contact a doctor if you: Have pain and swelling that do not get better with treatment or medicine. Have trouble pooping. Cannot poop. Have pain or swelling outside the area of the hemorrhoids. Get help right away if you have: Bleeding that will not stop. Summary Hemorrhoids are swollen veins in the butt or around the opening of the butt. They can cause pain, itching, or bleeding. Eat foods that have a lot of fiber in them. These include whole grains, beans, nuts, fruits, and vegetables. Take a warm-water bath (sitz bath) for 20 minutes to ease pain. Do this 3-4 times a day. This information is not intended to replace advice given to you by your health care provider. Make sure you discuss any questions you have with your healthcare provider. Document Revised: 12/31/2017 Document Reviewed: 05/14/2017 Elsevier Patient Education  [place].

## 2020-08-30 ENCOUNTER — Other Ambulatory Visit: Payer: Self-pay

## 2020-08-30 DIAGNOSIS — N5239 Other post-surgical erectile dysfunction: Secondary | ICD-10-CM

## 2020-08-30 MED ORDER — TADALAFIL 20 MG PO TABS: 20.0000 mg | ORAL_TABLET | Freq: Every day | ORAL | 0 refills | Status: DC

## 2020-08-30 NOTE — Telephone Encounter (Signed)
Patient called back and stated this prescription is over $800.00 in [place], pt is asking if we can do anything? I advised that we can't control anything outside of [place].

## 2020-08-30 NOTE — Telephone Encounter (Signed)
See my chart message

## 2020-08-31 NOTE — Telephone Encounter (Signed)
Billey Co, MD  You 59 minutes ago (7:30 AM)   Would recommend goodrx, ok to transfer to [facility] or other pharmacy of his choice   Micheal Madrid, MD  08/31/2020

## 2020-08-31 NOTE — Telephone Encounter (Signed)
[place] does not observe GoodRx.

## 2020-09-05 ENCOUNTER — Other Ambulatory Visit: Payer: Self-pay

## 2020-09-07 ENCOUNTER — Ambulatory Visit: Payer: Self-pay | Admitting: Urology

## 2020-09-13 ENCOUNTER — Other Ambulatory Visit: Payer: Self-pay | Admitting: *Deleted

## 2020-09-13 ENCOUNTER — Other Ambulatory Visit: Payer: Self-pay

## 2020-09-13 ENCOUNTER — Other Ambulatory Visit: Payer: Medicare PPO

## 2020-09-13 DIAGNOSIS — R972 Elevated prostate specific antigen [PSA]: Secondary | ICD-10-CM

## 2020-09-13 DIAGNOSIS — N5239 Other post-surgical erectile dysfunction: Secondary | ICD-10-CM

## 2020-09-13 MED ORDER — TADALAFIL 20 MG PO TABS: 20.0000 mg | ORAL_TABLET | Freq: Every day | ORAL | 0 refills | Status: DC

## 2020-09-14 LAB — PSA: Prostate Specific Ag, Serum: 1.4 ng/mL (ref 0.0–4.0)

## 2020-09-17 NOTE — Progress Notes (Signed)
09/18/2020 8:34 PM   Micheal Mccoy 10-Sep-1955 [id number]  Referring provider: [name], [address],  [place] [address]  Urological history: 1. Peyronie's disease - s/p plication [id number]  2. BPH with LU TS - I PSS 4/2 - PVR 0 mL - retrograde ejaculation with Rapaflo  - s/p HoLEP 02/2019  3. ED -contributing factors of age, BPH and Peyronie's disease - SHIM 63 - testosterone 878 on 12/12/2019 - contributing factors of age, BPH, relationship issues and Peyronie's disease - managed with tadalafil 20 mg, daily   4. High risk hematuria - non smoker - CTU 2017 - showed 130 gm prostate. It also showed a benign Bosniak 2 left upper pole cyst - cysto 2017 Enlarged prostate - 6 cm in length. Visually obstructive and intravesical lobe of prostate - cysto during HoLEP 02/2019 no suspicious lesions -CTU 02/2020 - Multiple renal lesions bilaterally, largest of which are classified as either simple (Bosniak class 1), or proteinaceous/hemorrhagic (Bosniak class 2) cysts. Smaller lesions are too small to definitively characterize, but also favored to represent tiny cysts. Should the patient's hematuria persist or worsen, definitive characterization with abdominal MRI with and without IV gadolinium could be  considered. - reports of gross heme - UA 3-10 RBC's   5. Elevated PSA - PSA Trend Component     Latest Ref Rng & Units 11/22/2015 05/21/2016 11/17/2016 05/21/2017  Prostate Specific Ag, Serum     0.0 - 4.0 ng/mL 2.3 3.3 4.3 (H) 3.9   Component     Latest Ref Rng & Units 11/20/2017 11/16/2018 11/30/2018 12/12/2019  Prostate Specific Ag, Serum     0.0 - 4.0 ng/mL 3.1 5.5 (H) 7.0 (H) 1.6   Component     Latest Ref Rng & Units 09/13/2020  Prostate Specific Ag, Serum     0.0 - 4.0 ng/mL 1.4   Prostate biopsy iPSA 4.2 - negative  Prostate MRI 12/2018 for PSA 7.  No findings suspicious for macroscopic prostate cancer.  PI-RADS 1.  BPH.  Calculated prostate  volume 87 mL Benign tissue with HoLEP 02/2019  HPI: Micheal Mccoy is a 65 y.o. male who presents today for 6 month follow up.   No urinary complaints at this visit.  Patient denies any modifying or aggravating factors.  Patient denies any dysuria or suprapubic/flank pain.  Patient denies any fevers, chills, nausea or vomiting.    He has an incident of blood in the toilet, but he is not aware of where it originated.  He states the GI folks felt it was not from a GI source.     IPSS     Row Name 09/18/20 1100         International Prostate Symptom Score   How often have you had the sensation of not emptying your bladder? Not at All     How often have you had to urinate less than every two hours? Less than half the time     How often have you found you stopped and started again several times when you urinated? Not at All     How often have you found it difficult to postpone urination? Not at All     How often have you had a weak urinary stream? Not at All     How often have you had to strain to start urination? Not at All     How many times did you typically get up at night to urinate?  2 Times     Total IPSS Score 4           Quality of Life due to urinary symptoms   If you were to spend the rest of your life with your urinary condition just the way it is now how would you feel about that? Mostly Satisfied              Score:  1-7 Mild 8-19 Moderate 20-35 Severe    Patient still having spontaneous erections.   He denies any pain or curvature with erections.  He is having good results with the tadalafil.  He does experience urinary leakage with erections and intercourse.     SHIM     Row Name 09/18/20 1135         SHIM: Over the last 6 months:   How do you rate your confidence that you could get and keep an erection? Moderate     When you had erections with sexual stimulation, how often were your erections hard enough for penetration (entering your partner)? Sometimes  (about half the time)     During sexual intercourse, how often were you able to maintain your erection after you had penetrated (entered) your partner? Sometimes (about half the time)     During sexual intercourse, how difficult was it to maintain your erection to completion of intercourse? Very Difficult     When you attempted sexual intercourse, how often was it satisfactory for you? A Few Times (much less than half the time)           SHIM Total Score   SHIM 13              Score: 1-7 Severe ED 8-11 Moderate ED 12-16 Mild-Moderate ED 17-21 Mild ED 22-25 No ED    PMH: Past Medical History:  Diagnosis Date   Arthritis    Benign neoplasm of ascending colon    Degenerative disc disease, cervical    Esophagitis, reflux 09/29/2005   Frequency of urination    GERD (gastroesophageal reflux disease)    Peyronie's disease    W/ PENILE CURVATURE    Surgical History: Past Surgical History:  Procedure Laterality Date   ANTERIOR CERVICAL DECOMP/DISCECTOMY FUSION N/A 01/25/2018   Procedure: ANTERIOR CERVICAL DECOMPRESSION/DISCECTOMY FUSION 1 LEVEL C7-T1, T1-T2;  Surgeon: Deetta Perla, MD;  Location: [facility];  Service: Neurosurgery;  Laterality: N/A;   ANTERIOR CERVICAL DISCECTOMY  01/25/2018   Dr. Lacinda Axon, ACDF ARTHRODESIS, OSTEOPHYTECTOMY &DECOMPRESSION OF SPINAL CORD AND/ORNERVE ROOTS; BELOW C2-- C7-T1    COLONOSCOPY WITH PROPOFOL N/A 09/14/2014   Procedure: COLONOSCOPY WITH PROPOFOL;  Surgeon: Lucilla Lame, MD;  Location: [facility];  Service: Endoscopy;  Laterality: N/A;   COLONOSCOPY WITH PROPOFOL N/A 01/31/2020   Procedure: COLONOSCOPY WITH PROPOFOL;  Surgeon: Lucilla Lame, MD;  Location: [facility];  Service: Endoscopy;  Laterality: N/A;   HERNIA REPAIR Bilateral 04/09/2015   Bard 3D Max mesh for bilateral inguinal hernias, primary repair of umbilical henria.    HOLEP-LASER ENUCLEATION OF THE PROSTATE WITH MORCELLATION N/A 02/17/2019   Procedure: HOLEP-LASER  ENUCLEATION OF THE PROSTATE WITH MORCELLATION;  Surgeon: Billey Co, MD;  Location: [facility];  Service: Urology;  Laterality: N/A;   INGUINAL HERNIA REPAIR Bilateral 04/09/2015   Procedure: LAPAROSCOPIC INGUINAL HERNIA;  Surgeon: Robert Bellow, MD;  Location: [facility];  Service: General;  Laterality: Bilateral;   NESBIT PROCEDURE N/A 06/28/2012   Procedure: 16 DOT PLACTATION PROCEDURE;  Surgeon: Claybon Jabs, MD;  Location: [place] [facility];  Service: Urology;  Laterality: N/A;   Peyronies Disease  06/2012   POLYPECTOMY  09/14/2014   Procedure: POLYPECTOMY;  Surgeon: Lucilla Lame, MD;  Location: [facility];  Service: Endoscopy;;   PROSTATE BIOPSY  03-02-15   neg   THORACIC DISCECTOMY  01/25/2018   Dr. Lacinda Axon, ARTHRODESIS ANTERIOR INTERBODY W/DISCECTOMY ADDITIONAL LEVEL [id number]   UMBILICAL HERNIA REPAIR N/A 04/09/2015   Procedure: HERNIA REPAIR UMBILICAL ADULT;  Surgeon: Robert Bellow, MD;  Location: [facility];  Service: General;  Laterality: N/A;    Home Medications:  Allergies as of 09/18/2020       Reactions   Aleve [naproxen] Hives        Medication List        Accurate as of September 18, 2020 11:59 PM. If you have any questions, ask your nurse or doctor.          tadalafil 20 MG tablet Commonly known as: CIALIS Take 1 tablet (20 mg total) by mouth daily. Or take every other day for ED        Allergies:  Allergies  Allergen Reactions   Aleve [Naproxen] Hives    Family History: Family History  Problem Relation Age of Onset   Prostate cancer Neg Hx    Bladder Cancer Neg Hx    Kidney cancer Neg Hx     Social History:  reports that he has never smoked. He has never used smokeless tobacco. He reports that he does not drink alcohol and does not use drugs.  ROS: Pertinent ROS in HPI  Physical Exam: BP (!) 151/88   Pulse 71   Ht '6\' 3"'$  (1.905 m)   Wt 183 lb (83 kg)   BMI 22.87 kg/m   Constitutional:  Well nourished. Alert and  oriented, No acute distress. HEENT: [place] AT, mask in place.  Trachea midline Cardiovascular: No clubbing, cyanosis, or edema. Respiratory: Normal respiratory effort, no increased work of breathing. GU: No CVA tenderness.  No bladder fullness or masses.  Patient with circumcised phallus.  Urethral meatus is patent.  No penile discharge. No penile lesions or rashes. Scrotum without lesions, cysts, rashes and/or edema.  Testicles are located scrotally bilaterally. No masses are appreciated in the testicles. Left and Mccoy epididymis are normal. Rectal: Patient with  normal sphincter tone. Anus and perineum without scarring or rashes. No rectal masses are appreciated. Prostate is approximately 50 +  grams, HoLEP defect, no nodules are appreciated. Seminal vesicles could not be palpated Neurologic: Grossly intact, no focal deficits, moving all 4 extremities. Psychiatric: Normal mood and affect.    Laboratory Data: Urinalysis Component     Latest Ref Rng & Units 09/18/2020          Specific Gravity, UA     1.005 - 1.030 1.025  pH, UA     5.0 - 7.5 5.5  Color, UA     Yellow Yellow  Appearance Ur     Clear Clear  Leukocytes,UA     Negative Negative  Protein,UA     Negative/Trace Negative  Glucose, UA     Negative Negative  Ketones, UA     Negative Negative  RBC, UA     Negative 2+ (A)  Bilirubin, UA     Negative Negative  Urobilinogen, Ur     0.2 - 1.0 mg/dL 0.2  Nitrite, UA     Negative Negative  Microscopic Examination      See below:   Component  Latest Ref Rng & Units 09/18/2020          WBC, UA     0 - 5 /hpf 0-5  RBC     0 - 2 /hpf 3-10 (A)  Epithelial Cells (non renal)     0 - 10 /hpf None seen  Bacteria, UA     None seen/Few None seen  I have reviewed the labs.    Pertinent Imaging: N/A   Assessment & Plan:    1. Erectile dysfunction - Cialis 20 mg every other day is effective for satisfactory erections  2. SUI -Completed physical therapy -Having  issues with stress incontinence with intercourse -will reassess once systems completed  3. Gross hematuria/renal cysts -cysto completed last year NED -CTU 03474 -bilateral cysts -reports of gross heme -UA 3-10 RBC's  -Per radiologist recommendation with CT urogram in February if hematuria persisted to pursue MRI of the kidneys for further evaluation of his renal cysts -MRI abdomen and pelvis is ordered and he will return for report and cystoscopy with Dr. Diamantina Providence  4. BPH  - s/p HoLEP 02/2019 -voiding well   Return for MRI report and cysto for gross hematuria .  These notes generated with voice recognition software. I apologize for typographical errors.  Zara Council, PA-C  [facility] [address]  [place], [place] [address] [phone]

## 2020-09-18 ENCOUNTER — Encounter: Payer: Self-pay | Admitting: Urology

## 2020-09-18 ENCOUNTER — Ambulatory Visit (INDEPENDENT_AMBULATORY_CARE_PROVIDER_SITE_OTHER): Payer: Medicare PPO | Admitting: Urology

## 2020-09-18 ENCOUNTER — Other Ambulatory Visit: Payer: Self-pay

## 2020-09-18 VITALS — BP 151/88 | HR 71 | Ht 75.0 in | Wt 183.0 lb

## 2020-09-18 DIAGNOSIS — N393 Stress incontinence (female) (male): Secondary | ICD-10-CM | POA: Diagnosis not present

## 2020-09-18 DIAGNOSIS — N281 Cyst of kidney, acquired: Secondary | ICD-10-CM

## 2020-09-18 DIAGNOSIS — N138 Other obstructive and reflux uropathy: Secondary | ICD-10-CM | POA: Diagnosis not present

## 2020-09-18 DIAGNOSIS — R319 Hematuria, unspecified: Secondary | ICD-10-CM

## 2020-09-18 DIAGNOSIS — N401 Enlarged prostate with lower urinary tract symptoms: Secondary | ICD-10-CM | POA: Diagnosis not present

## 2020-09-18 DIAGNOSIS — N529 Male erectile dysfunction, unspecified: Secondary | ICD-10-CM

## 2020-09-18 LAB — BLADDER SCAN AMB NON-IMAGING: Scan Result: 0

## 2020-09-19 LAB — URINALYSIS, COMPLETE
Bilirubin, UA: NEGATIVE
Glucose, UA: NEGATIVE
Ketones, UA: NEGATIVE
Leukocytes,UA: NEGATIVE
Nitrite, UA: NEGATIVE
Protein,UA: NEGATIVE
Specific Gravity, UA: 1.025 (ref 1.005–1.030)
Urobilinogen, Ur: 0.2 mg/dL (ref 0.2–1.0)
pH, UA: 5.5 (ref 5.0–7.5)

## 2020-09-19 LAB — MICROSCOPIC EXAMINATION
Bacteria, UA: NONE SEEN
Epithelial Cells (non renal): NONE SEEN /hpf (ref 0–10)

## 2020-09-25 ENCOUNTER — Other Ambulatory Visit: Payer: Self-pay

## 2020-09-25 ENCOUNTER — Encounter: Payer: Self-pay | Admitting: Gastroenterology

## 2020-09-25 ENCOUNTER — Ambulatory Visit (INDEPENDENT_AMBULATORY_CARE_PROVIDER_SITE_OTHER): Payer: Medicare PPO | Admitting: Gastroenterology

## 2020-09-25 VITALS — BP 147/90 | HR 60 | Temp 98.0°F | Ht 75.0 in | Wt 184.1 lb

## 2020-09-25 DIAGNOSIS — K648 Other hemorrhoids: Secondary | ICD-10-CM

## 2020-09-25 NOTE — Progress Notes (Signed)
Patient follow-ups today for banding of hemorrhoids    Summary of history :  Patient of Dr Allen Norris , h/o rectal bleeding . Colonoscopy showed small internal hemorrhoids. He has been on a high-fiber diet still has issues with perianal itching and occasional bleeding denies any constipation.  Discussed options about conservative treatment which she has tried versus proceeding with hemorrhoidal banding and he wishes to proceed with hemorrhoidal banding after examination   First round:08/20/2020: LL banded    Interval history  08/20/2020-09/25/2020  Doing well since last session   Digital rectal exam performed in the presence of a chaperone. External anal findings: normal  Internal findings: , No masses, no blood on glove noticed.    PROCEDURE NOTE: The patient presents with symptomatic grade 2 hemorrhoids, unresponsive to maximal medical therapy, requesting rubber band ligation of his/her hemorrhoidal disease.  All risks, benefits and alternative forms of therapy were described and informed consent was obtained.  In the Left Lateral Decubitus position (if anoscopy is performed) anoscopic examination revealed grade 2 hemorrhoids in the RA and RP position(s).   The decision was made to band the RP internal hemorrhoid, and the Dora was used to perform band ligation without complication.  Digital anorectal examination was then performed to assure proper positioning of the band, and to adjust the banded tissue as required.  The patient was discharged home without pain or other issues.  Dietary and behavioral recommendations were given and (if necessary - prescriptions were given), along with follow-up instructions.  The patient will return 4 weeks for follow-up and possible additional banding as required.  No complications were encountered and the patient tolerated the procedure well.   Plan:  Avoid constipation.  Commence on stool softeners if not already on  Follow-up:4  weeks  Dr Jonathon Bellows MD,MRCP [facility]) Gastroenterology/Hepatology Pager: [phone]

## 2020-09-26 ENCOUNTER — Telehealth: Payer: Self-pay | Admitting: Gastroenterology

## 2020-09-26 NOTE — Telephone Encounter (Signed)
Pt. Says that he is having pain from his procedure on yesterday.He would like a call back to see if he can come back in today

## 2020-09-26 NOTE — Telephone Encounter (Signed)
Patient cam in to the office today to see Dr. Vicente Males with no appointment scheduled and demanded to be seen. However, patient was informed that Dr. Vicente Males had left the office and would return until tomorrow in the afternoon. Therefore, I offered him to come back tomorrow with an appointment scheduled and he agreed. We will see him tomorrow 09/27/2020 at 1:30 PM to check his internal hemorrhoid. Patient had no further questions.

## 2020-09-27 ENCOUNTER — Ambulatory Visit: Payer: Medicare PPO | Admitting: Gastroenterology

## 2020-09-27 ENCOUNTER — Encounter: Payer: Self-pay | Admitting: Gastroenterology

## 2020-09-27 ENCOUNTER — Other Ambulatory Visit: Payer: Self-pay

## 2020-09-27 VITALS — BP 146/73 | HR 69 | Temp 98.3°F | Ht 75.0 in | Wt 184.0 lb

## 2020-09-27 DIAGNOSIS — K648 Other hemorrhoids: Secondary | ICD-10-CM

## 2020-09-27 NOTE — Progress Notes (Signed)
    Jonathon Bellows MD, MRCP(U.K) [address]  [place]  [place], [place] [address]  Main: [phone]  Fax: [phone]   Primary Care Physician: [name], MD  Primary Gastroenterologist:  Dr. Jonathon Bellows   Chief Complaint  Patient presents with   Hemorrhoid banding check up    HPI: Micheal Mccoy is a 65 y.o. male  I performed banding of his right posterior colon of internal hemorrhoid on 09/25/2020.  He states that he went home was feeling fine then started developing severe pain and is here today.  Current Outpatient Medications  Medication Sig Dispense Refill   tadalafil (CIALIS) 20 MG tablet Take 1 tablet (20 mg total) by mouth daily. Or take every other day for ED 30 tablet 0   No current facility-administered medications for this visit.    Allergies as of 09/27/2020 - Review Complete 09/27/2020  Allergen Reaction Noted   Aleve [naproxen] Hives 03/07/2019    ROS:  General: Negative for anorexia, weight loss, fever, chills, fatigue, weakness. ENT: Negative for hoarseness, difficulty swallowing , nasal congestion. CV: Negative for chest pain, angina, palpitations, dyspnea on exertion, peripheral edema.  Respiratory: Negative for dyspnea at rest, dyspnea on exertion, cough, sputum, wheezing.  GI: See history of present illness. GU:  Negative for dysuria, hematuria, urinary incontinence, urinary frequency, nocturnal urination.  Endo: Negative for unusual weight change.    Physical Examination:   BP (!) 146/73   Pulse 69   Temp 98.3 F (36.8 C) (Oral)   Ht 6\' 3"  (1.905 m)   Wt 184 lb (83.5 kg)   BMI 23.00 kg/m   Rectal exam: Chaperone in the room.  Externally no abnormalities felt internally and felt at 7 o'clock position loosened significantly.  Anoscopy performed no blood noted no pain to introduce the endoscope right posterior hemorrhoid still noted.  I could not see the band in position.   Imaging Studies: No results found.  Assessment and  Plan:   Micheal Mccoy is a 65 y.o. y/o male who is here today for pain following his banding procedure on 09/25/2020.  I loosened the band almost completely.  It is possible that the band has fallen off at this point of time.  I could not palpate it significantly as a lot of stool in the rectal vault and I could not see the band on anoscopy.  I checked on him for repeat of 20 minutes after losing the band and he was pain-free at the time he left the office.  I have given him a phone number to call my office if there were to be any further problems.    Dr Jonathon Bellows  MD,MRCP [facility]) Follow up in as needed

## 2020-09-28 ENCOUNTER — Telehealth: Payer: Self-pay

## 2020-09-28 NOTE — Telephone Encounter (Signed)
Called patient this AM to ask how he was feeling and he stated that he was feeling much better. He stated that he still had some pain but was overall better. Therefore, I recommended for him to take Ibuprofen 800 MG every 8 hours PRN. Patient agreed and stated that at this time he was at the airport because he was going to see his son play football since he felt so much better. I wished him well and to have a safe trip. I also told him to give [place] a call in case he had any questions. Patient agreed.

## 2020-10-03 ENCOUNTER — Other Ambulatory Visit: Payer: Self-pay

## 2020-10-03 ENCOUNTER — Ambulatory Visit
Admission: RE | Admit: 2020-10-03 | Discharge: 2020-10-03 | Disposition: A | Discharge status: Home / Self Care | Payer: Medicare PPO | Source: Ambulatory Visit | Attending: Urology | Admitting: Urology

## 2020-10-03 DIAGNOSIS — R319 Hematuria, unspecified: Secondary | ICD-10-CM | POA: Diagnosis not present

## 2020-10-03 DIAGNOSIS — N281 Cyst of kidney, acquired: Secondary | ICD-10-CM | POA: Diagnosis not present

## 2020-10-03 DIAGNOSIS — K7689 Other specified diseases of liver: Secondary | ICD-10-CM | POA: Diagnosis not present

## 2020-10-03 DIAGNOSIS — D1803 Hemangioma of intra-abdominal structures: Secondary | ICD-10-CM | POA: Diagnosis not present

## 2020-10-03 MED ORDER — GADOBUTROL 1 MMOL/ML IV SOLN
8.0000 mL | Freq: Once | INTRAVENOUS | Status: AC | PRN
  Administered 2020-10-03: 8 mL via INTRAVENOUS

## 2020-10-18 ENCOUNTER — Encounter: Payer: Self-pay | Admitting: Urology

## 2020-10-18 ENCOUNTER — Ambulatory Visit (INDEPENDENT_AMBULATORY_CARE_PROVIDER_SITE_OTHER): Payer: Medicare PPO | Admitting: Urology

## 2020-10-18 ENCOUNTER — Other Ambulatory Visit: Payer: Self-pay

## 2020-10-18 VITALS — BP 144/84 | HR 58 | Ht 75.0 in | Wt 184.0 lb

## 2020-10-18 DIAGNOSIS — Z9889 Other specified postprocedural states: Secondary | ICD-10-CM | POA: Diagnosis not present

## 2020-10-18 DIAGNOSIS — N5239 Other post-surgical erectile dysfunction: Secondary | ICD-10-CM

## 2020-10-18 DIAGNOSIS — R3129 Other microscopic hematuria: Secondary | ICD-10-CM

## 2020-10-18 DIAGNOSIS — R3121 Asymptomatic microscopic hematuria: Secondary | ICD-10-CM

## 2020-10-18 MED ORDER — TADALAFIL 20 MG PO TABS: 20.0000 mg | ORAL_TABLET | Freq: Every day | ORAL | 3 refills | Status: DC

## 2020-10-18 NOTE — Patient Instructions (Signed)
Kegel Exercises  Kegel exercises can help strengthen your pelvic floor muscles. The pelvic floor is a group of muscles that support your rectum, small intestine, and bladder. In females, pelvic floor muscles also help support the womb (uterus). These muscles help you control the flow of urine and stool. Kegel exercises are painless and simple, and they do not require any equipment. Your provider may suggest Kegel exercises to: Improve bladder and bowel control. Improve sexual response. Improve weak pelvic floor muscles after surgery to remove the uterus (hysterectomy) or pregnancy (females). Improve weak pelvic floor muscles after prostate gland removal or surgery (males). Kegel exercises involve squeezing your pelvic floor muscles, which are the same muscles you squeeze when you try to stop the flow of urine or keep from passing gas. The exercises can be done while sitting, standing, or lying down, but itis best to vary your position. Exercises How to do Kegel exercises: Squeeze your pelvic floor muscles tight. You should feel a tight lift in your rectal area. If you are a male, you should also feel a tightness in your vaginal area. Keep your stomach, buttocks, and legs relaxed. Hold the muscles tight for up to 10 seconds. Breathe normally. Relax your muscles. Repeat as told by your health care provider. Repeat this exercise daily as told by your health care provider. Continue to do this exercise for at least 4-6 weeks, or for as long as told by your healthcare provider. You may be referred to a physical therapist who can help you learn more abouthow to do Kegel exercises. Depending on your condition, your health care provider may recommend: Varying how long you squeeze your muscles. Doing several sets of exercises every day. Doing exercises for several weeks. Making Kegel exercises a part of your regular exercise routine. This information is not intended to replace advice given to you by  your health care provider. Make sure you discuss any questions you have with your healthcare provider. Document Revised: 12/14/2019 Document Reviewed: 08/12/2017 Elsevier Patient Education  2022 Elsevier Inc.  

## 2020-10-18 NOTE — Progress Notes (Signed)
Cystoscopy Procedure Note:  Indication: Microscopic hematuria, history of HOLEP for BPH  After informed consent and discussion of the procedure and its risks, Micheal Mccoy was positioned and prepped in the standard fashion. Cystoscopy was performed with a flexible cystoscope. The urethra, bladder neck and entire bladder was visualized in a standard fashion.  Wide open prostatic fossa from prior HOLEP, minimal regrowth of left apical tissue.  Excellent contraction of sphincter on demand. The ureteral orifices were visualized in their normal location and orientation.  Bladder mucosa grossly normal throughout, no abnormalities on retroflexion.  Imaging: CT urogram and MRI with no concerning findings  Findings: Normal cystoscopy  Assessment and Plan: No abnormalities on imaging and cystoscopy for microscopic hematuria, would not recommend aggressive further work-up  Regarding his intermittent stress incontinence after HOLEP, recommended continuing Kegel exercises and pelvic floor physical therapy, consider Cunningham clamp in the future  PSA dropped appropriately after HOLEP to 1.4 from 7  Continue Cialis 20 mg for ED  [name], MD 10/18/2020

## 2020-10-18 NOTE — Addendum Note (Signed)
Addended by: Despina Hidden on: 10/18/2020 10:52 AM   Modules accepted: Orders

## 2020-10-24 ENCOUNTER — Ambulatory Visit: Payer: Medicare PPO | Admitting: Gastroenterology

## 2020-12-25 DIAGNOSIS — H524 Presbyopia: Secondary | ICD-10-CM | POA: Diagnosis not present

## 2020-12-27 ENCOUNTER — Other Ambulatory Visit: Payer: Self-pay

## 2020-12-27 ENCOUNTER — Ambulatory Visit (INDEPENDENT_AMBULATORY_CARE_PROVIDER_SITE_OTHER): Payer: Medicare PPO

## 2020-12-27 VITALS — BP 128/78 | HR 52 | Temp 97.4°F | Ht 75.0 in | Wt 190.2 lb

## 2020-12-27 DIAGNOSIS — Z20822 Contact with and (suspected) exposure to covid-19: Secondary | ICD-10-CM | POA: Diagnosis not present

## 2020-12-27 DIAGNOSIS — Z0000 Encounter for general adult medical examination without abnormal findings: Secondary | ICD-10-CM

## 2020-12-27 NOTE — Progress Notes (Signed)
Subjective:   Micheal Mccoy is a 65 y.o. male who presents for Medicare Annual/Subsequent preventive examination.  Review of Systems           Objective:    Today's Vitals   12/27/20 1057  BP: 128/78  Pulse: (!) 52  Temp: (!) 97.4 F (36.3 C)  TempSrc: Oral  SpO2: 100%  Weight: 190 lb 3.2 oz (86.3 kg)  Height: 6\' 3"  (1.905 m)   Body mass index is 23.77 kg/m.  Advanced Directives 12/27/2020 04/02/2020 01/31/2020 12/26/2019 03/07/2019 02/17/2019 01/11/2019  Does Patient Have a Medical Advance Directive? No Yes Yes Yes Yes Yes Yes  Type of Advance Directive - - - Living will Living will;Healthcare Power of [place];Living will [place];Living will  Does patient want to make changes to medical advance directive? - - - - No - Patient declined No - Patient declined No - Patient declined  Copy of [place] in Chart? - - - - No - copy requested - -  Would patient like information on creating a medical advance directive? No - Patient declined - - - No - Patient declined - -  Pre-existing out of facility DNR order (yellow form or pink MOST form) - - - - - - -    Current Medications (verified) Outpatient Encounter Medications as of 12/27/2020  Medication Sig   tadalafil (CIALIS) 20 MG tablet Take 1 tablet (20 mg total) by mouth daily. Or take every other day for ED   No facility-administered encounter medications on file as of 12/27/2020.    Allergies (verified) Aleve [naproxen]   History: Past Medical History:  Diagnosis Date   Arthritis    Benign neoplasm of ascending colon    Degenerative disc disease, cervical    Esophagitis, reflux 09/29/2005   Frequency of urination    GERD (gastroesophageal reflux disease)    Peyronie's disease    W/ PENILE CURVATURE   Past Surgical History:  Procedure Laterality Date   ANTERIOR CERVICAL DECOMP/DISCECTOMY FUSION N/A 01/25/2018   Procedure: ANTERIOR CERVICAL  DECOMPRESSION/DISCECTOMY FUSION 1 LEVEL C7-T1, T1-T2;  Surgeon: Deetta Perla, MD;  Location: [facility];  Service: Neurosurgery;  Laterality: N/A;   ANTERIOR CERVICAL DISCECTOMY  01/25/2018   Dr. Lacinda Axon, ACDF ARTHRODESIS, OSTEOPHYTECTOMY &DECOMPRESSION OF SPINAL CORD AND/ORNERVE ROOTS; BELOW C2-- C7-T1    COLONOSCOPY WITH PROPOFOL N/A 09/14/2014   Procedure: COLONOSCOPY WITH PROPOFOL;  Surgeon: Lucilla Lame, MD;  Location: [place];  Service: Endoscopy;  Laterality: N/A;   COLONOSCOPY WITH PROPOFOL N/A 01/31/2020   Procedure: COLONOSCOPY WITH PROPOFOL;  Surgeon: Lucilla Lame, MD;  Location: [facility];  Service: Endoscopy;  Laterality: N/A;   HERNIA REPAIR Bilateral 04/09/2015   Bard 3D Max mesh for bilateral inguinal hernias, primary repair of umbilical henria.    HOLEP-LASER ENUCLEATION OF THE PROSTATE WITH MORCELLATION N/A 02/17/2019   Procedure: HOLEP-LASER ENUCLEATION OF THE PROSTATE WITH MORCELLATION;  Surgeon: Billey Co, MD;  Location: [facility];  Service: Urology;  Laterality: N/A;   INGUINAL HERNIA REPAIR Bilateral 04/09/2015   Procedure: LAPAROSCOPIC INGUINAL HERNIA;  Surgeon: Robert Bellow, MD;  Location: [facility];  Service: General;  Laterality: Bilateral;   NESBIT PROCEDURE N/A 06/28/2012   Procedure: 16 DOT PLACTATION PROCEDURE;  Surgeon: Claybon Jabs, MD;  Location: [facility];  Service: Urology;  Laterality: N/A;   Peyronies Disease  06/2012   POLYPECTOMY  09/14/2014   Procedure: POLYPECTOMY;  Surgeon: Lucilla Lame,  MD;  Location: [place];  Service: Endoscopy;;   PROSTATE BIOPSY  03-02-15   neg   THORACIC DISCECTOMY  01/25/2018   Dr. Lacinda Axon, ARTHRODESIS ANTERIOR INTERBODY W/DISCECTOMY ADDITIONAL LEVEL F7-C9   UMBILICAL HERNIA REPAIR N/A 04/09/2015   Procedure: HERNIA REPAIR UMBILICAL ADULT;  Surgeon: Robert Bellow, MD;  Location: [facility];  Service: General;  Laterality: N/A;   Family History  Problem Relation Age of Onset   Prostate  cancer Neg Hx    Bladder Cancer Neg Hx    Kidney cancer Neg Hx    Social History   Socioeconomic History   Marital status: Married    Spouse name: Not on file   Number of children: 3   Years of education: PHD   Highest education level: Doctorate  Occupational History   Occupation: Associate Professor    Employer: [employer]    Comment: retired  Tobacco Use   Smoking status: Never   Smokeless tobacco: Never  Scientific laboratory technician Use: Never used  Substance and Sexual Activity   Alcohol use: No   Drug use: No   Sexual activity: Yes  Other Topics Concern   Not on file  Social History Narrative   Not on file   Social Determinants of Health   Financial Resource Strain: Low Risk    Difficulty of Paying Living Expenses: Not hard at all  Food Insecurity: No Food Insecurity   Worried About Charity fundraiser in the Last Year: Never true   [place] in the Last Year: Never true  Transportation Needs: No Transportation Needs   Lack of Transportation (Medical): No   Lack of Transportation (Non-Medical): No  Physical Activity: Sufficiently Active   Days of Exercise per Week: 4 days   Minutes of Exercise per Session: 50 min  Stress: No Stress Concern Present   Feeling of Stress : Not at all  Social Connections: Moderately Integrated   Frequency of Communication with Friends and Family: More than three times a week   Frequency of Social Gatherings with Friends and Family: More than three times a week   Attends Religious Services: Never   Marine scientist or Organizations: Yes   Attends Music therapist: More than 4 times per year   Marital Status: Married    Tobacco Counseling Counseling given: Not Answered   Clinical Intake:  Pre-visit preparation completed: Yes  Pain : No/denies pain     Nutritional Status: BMI of 19-24  Normal Diabetes: No  How often do you need to have someone help you when you read instructions, pamphlets, or  other written materials from your doctor or pharmacy?: 1 - Never  Diabetic?no  Interpreter Needed?: No  Information entered by :: Kirke Shaggy, LPN   Activities of Daily Living In your present state of health, do you have any difficulty performing the following activities: 12/24/2020 02/24/2020  Hearing? N Y  Vision? N Y  Difficulty concentrating or making decisions? N N  Walking or climbing stairs? N N  Dressing or bathing? N N  Doing errands, shopping? N N  Preparing Food and eating ? N -  Using the Toilet? N -  In the past six months, have you accidently leaked urine? N -  Do you have problems with loss of bowel control? N -  Managing your Medications? N -  Managing your Finances? N -  Housekeeping or managing your Housekeeping? N -  Some recent data  might be hidden    Patient Care Team: [name], MD as PCP - General (Family Medicine) Kate Sable, MD as PCP - Cardiology (Cardiology) Bary Castilla Forest Gleason, MD as Consulting Physician (General Surgery) Deetta Perla, MD as Consulting Physician (Neurosurgery) Billey Co, MD as Consulting Physician (Urology) System, Provider Not In (Optometry) Throat, [place] (Audiology)  Indicate any recent Medical Services you may have received from other than [facility] providers in the past year (date may be approximate).     Assessment:   This is a routine wellness examination for [place].  Hearing/Vision screen No results found.  Dietary issues and exercise activities discussed:     Goals Addressed             This Visit's Progress    DIET - INCREASE WATER INTAKE         Depression Screen PHQ 2/9 Scores 12/27/2020 02/24/2020 12/26/2019 11/16/2018 07/28/2017 12/28/2015 08/25/2014  PHQ - 2 Score 0 0 0 3 3 0 0  PHQ- 9 Score - 0 - 5 5 0 0    Fall Risk Fall Risk  12/27/2020 12/24/2020 02/24/2020 12/26/2019 11/16/2018  Falls in the past year? 0 0 0 0 0  Number falls in past yr: 0 - 0 0 0  Injury with  Fall? 0 - 0 0 0  Risk for fall due to : No Fall Risks - - - -  Follow up Falls evaluation completed - - - Falls evaluation completed    FALL RISK PREVENTION PERTAINING TO THE HOME:  Any stairs in or around the home? No  If so, are there any without handrails? No  Home free of loose throw rugs in walkways, pet beds, electrical cords, etc? Yes  Adequate lighting in your home to reduce risk of falls? Yes   ASSISTIVE DEVICES UTILIZED TO PREVENT FALLS:  Life alert? No  Use of a cane, walker or w/c? No  Grab bars in the bathroom? No  Shower chair or bench in shower? No  Elevated toilet seat or a handicapped toilet? No   TIMED UP AND GO:  Was the test performed? Yes .  Length of time to ambulate 10 feet: 3 sec.   Gait steady and fast without use of assistive device  Cognitive Function:     6CIT Screen 12/26/2019  What Year? 0 points  What month? 0 points  What time? 0 points  Count back from 20 0 points  Months in reverse 0 points  Repeat phrase 0 points  Total Score 0    Immunizations Immunization History  Administered Date(s) Administered   Fluad Quad(high Dose 65+) 11/16/2018   Influenza Inj Mdck Quad Pf 12/26/2019   Influenza,inj,Quad PF,6+ Mos 12/28/2015   PFIZER(Purple Top)SARS-COV-2 Vaccination 02/26/2019, 03/28/2019, 10/04/2019   PPD Test 07/19/2019   Pneumococcal Conjugate-13 02/24/2020   Pneumococcal Polysaccharide-23 11/16/2018   Tdap 03/22/2013   Zoster Recombinat (Shingrix) 11/16/2018, 02/24/2020    TDAP status: Up to date  Flu Vaccine status: Up to date  Pneumococcal vaccine status: Up to date  Covid-19 vaccine status: Completed vaccines  Qualifies for Shingles Vaccine? Yes   Zostavax completed No   Shingrix Completed?: Yes  Screening Tests Health Maintenance  Topic Date Due   COVID-19 Vaccine (4 - Booster for Pfizer series) 11/29/2019   INFLUENZA VACCINE  08/06/2020   TETANUS/TDAP  03/23/2023   COLONOSCOPY (Pts 45-31yrs Insurance  coverage will need to be confirmed)  01/31/2027   Pneumonia Vaccine 35+ Years old  Completed   Hepatitis C Screening  Completed   Zoster Vaccines- Shingrix  Completed   HPV VACCINES  Aged Out    Health Maintenance  Health Maintenance Due  Topic Date Due   COVID-19 Vaccine (4 - Booster for Pfizer series) 11/29/2019   INFLUENZA VACCINE  08/06/2020    Colorectal cancer screening: Type of screening: Colonoscopy. Completed 01/31/20. Repeat every 5 years  Lung Cancer Screening: (Low Dose CT Chest recommended if Age 25-80 years, 30 pack-year currently smoking OR have quit w/in 15years.) does not qualify.     Additional Screening:  Hepatitis C Screening: does qualify; Completed 11/16/18  Vision Screening: Recommended annual ophthalmology exams for early detection of glaucoma and other disorders of the eye. Is the patient up to date with their annual eye exam?  Yes  Who is the provider or what is the name of the office in which the patient attends annual eye exams? [facility] If pt is not established with a provider, would they like to be referred to a provider to establish care? No .   Dental Screening: Recommended annual dental exams for proper oral hygiene  Community Resource Referral / Chronic Care Management: CRR required this visit?  No   CCM required this visit?  No      Plan:     I have personally reviewed and noted the following in the patients chart:   Medical and social history Use of alcohol, tobacco or illicit drugs  Current medications and supplements including opioid prescriptions. Patient is not currently taking opioid prescriptions. Functional ability and status Nutritional status Physical activity Advanced directives List of other physicians Hospitalizations, surgeries, and ER visits in previous 12 months Vitals Screenings to include cognitive, depression, and falls Referrals and appointments  In addition, I have reviewed and  discussed with patient certain preventive protocols, quality metrics, and best practice recommendations. A written personalized care plan for preventive services as well as general preventive health recommendations were provided to patient.     Dionisio David, LPN   [id number]   Nurse Notes: none

## 2020-12-27 NOTE — Patient Instructions (Signed)
[name] , Thank you for taking time to come for your Medicare Wellness Visit. I appreciate your ongoing commitment to your health goals. Please review the following plan we discussed and let me know if I can assist you in the future.   Screening recommendations/referrals: Colonoscopy: 01/31/20 Recommended yearly ophthalmology/optometry visit for glaucoma screening and checkup Recommended yearly dental visit for hygiene and checkup  Vaccinations: Influenza vaccine: 10/24/20 Pneumococcal vaccine: 02/24/20 Tdap vaccine: 03/22/13 Shingles vaccine: 11/16/18,   02/24/20 Covid-19: 02/26/19, 03/28/19, 10/04/19, 10/24/20  Advanced directives: no  Conditions/risks identified: none  Next appointment: Follow up in one year for your annual wellness visit. 01/01/22@ 9am  Preventive Care 44 Years and Older, Male Preventive care refers to lifestyle choices and visits with your health care provider that can promote health and wellness. What does preventive care include? A yearly physical exam. This is also called an annual well check. Dental exams once or twice a year. Routine eye exams. Ask your health care provider how often you should have your eyes checked. Personal lifestyle choices, including: Daily care of your teeth and gums. Regular physical activity. Eating a healthy diet. Avoiding tobacco and drug use. Limiting alcohol use. Practicing safe sex. Taking low doses of aspirin every day. Taking vitamin and mineral supplements as recommended by your health care provider. What happens during an annual well check? The services and screenings done by your health care provider during your annual well check will depend on your age, overall health, lifestyle risk factors, and family history of disease. Counseling  Your health care provider may ask you questions about your: Alcohol use. Tobacco use. Drug use. Emotional well-being. Home and relationship well-being. Sexual activity. Eating  habits. History of falls. Memory and ability to understand (cognition). Work and work Statistician. Screening  You may have the following tests or measurements: Height, weight, and BMI. Blood pressure. Lipid and cholesterol levels. These may be checked every 5 years, or more frequently if you are over 9 years old. Skin check. Lung cancer screening. You may have this screening every year starting at age 21 if you have a 30-pack-year history of smoking and currently smoke or have quit within the past 15 years. Fecal occult blood test (FOBT) of the stool. You may have this test every year starting at age 85. Flexible sigmoidoscopy or colonoscopy. You may have a sigmoidoscopy every 5 years or a colonoscopy every 10 years starting at age 91. Prostate cancer screening. Recommendations will vary depending on your family history and other risks. Hepatitis C blood test. Hepatitis B blood test. Sexually transmitted disease (STD) testing. Diabetes screening. This is done by checking your blood sugar (glucose) after you have not eaten for a while (fasting). You may have this done every 1-3 years. Abdominal aortic aneurysm (AAA) screening. You may need this if you are a current or former smoker. Osteoporosis. You may be screened starting at age 67 if you are at high risk. Talk with your health care provider about your test results, treatment options, and if necessary, the need for more tests. Vaccines  Your health care provider may recommend certain vaccines, such as: Influenza vaccine. This is recommended every year. Tetanus, diphtheria, and acellular pertussis (Tdap, Td) vaccine. You may need a Td booster every 10 years. Zoster vaccine. You may need this after age 56. Pneumococcal 13-valent conjugate (PCV13) vaccine. One dose is recommended after age 34. Pneumococcal polysaccharide (PPSV23) vaccine. One dose is recommended after age 33. Talk to your health care provider about  which screenings and  vaccines you need and how often you need them. This information is not intended to replace advice given to you by your health care provider. Make sure you discuss any questions you have with your health care provider. Document Released: 01/19/2015 Document Revised: 09/12/2015 Document Reviewed: 10/24/2014 Elsevier Interactive Patient Education  2017 [place] Prevention in the Home Falls can cause injuries. They can happen to people of all ages. There are many things you can do to make your home safe and to help prevent falls. What can I do on the outside of my home? Regularly fix the edges of walkways and driveways and fix any cracks. Remove anything that might make you trip as you walk through a door, such as a raised step or threshold. Trim any bushes or trees on the path to your home. Use bright outdoor lighting. Clear any walking paths of anything that might make someone trip, such as rocks or tools. Regularly check to see if handrails are loose or broken. Make sure that both sides of any steps have handrails. Any raised decks and porches should have guardrails on the edges. Have any leaves, snow, or ice cleared regularly. Use sand or salt on walking paths during winter. Clean up any spills in your garage right away. This includes oil or grease spills. What can I do in the bathroom? Use night lights. Install grab bars by the toilet and in the tub and shower. Do not use towel bars as grab bars. Use non-skid mats or decals in the tub or shower. If you need to sit down in the shower, use a plastic, non-slip stool. Keep the floor dry. Clean up any water that spills on the floor as soon as it happens. Remove soap buildup in the tub or shower regularly. Attach bath mats securely with double-sided non-slip rug tape. Do not have throw rugs and other things on the floor that can make you trip. What can I do in the bedroom? Use night lights. Make sure that you have a light by your  bed that is easy to reach. Do not use any sheets or blankets that are too big for your bed. They should not hang down onto the floor. Have a firm chair that has side arms. You can use this for support while you get dressed. Do not have throw rugs and other things on the floor that can make you trip. What can I do in the kitchen? Clean up any spills right away. Avoid walking on wet floors. Keep items that you use a lot in easy-to-reach places. If you need to reach something above you, use a strong step stool that has a grab bar. Keep electrical cords out of the way. Do not use floor polish or wax that makes floors slippery. If you must use wax, use non-skid floor wax. Do not have throw rugs and other things on the floor that can make you trip. What can I do with my stairs? Do not leave any items on the stairs. Make sure that there are handrails on both sides of the stairs and use them. Fix handrails that are broken or loose. Make sure that handrails are as long as the stairways. Check any carpeting to make sure that it is firmly attached to the stairs. Fix any carpet that is loose or worn. Avoid having throw rugs at the top or bottom of the stairs. If you do have throw rugs, attach them to the floor with  carpet tape. Make sure that you have a light switch at the top of the stairs and the bottom of the stairs. If you do not have them, ask someone to add them for you. What else can I do to help prevent falls? Wear shoes that: Do not have high heels. Have rubber bottoms. Are comfortable and fit you well. Are closed at the toe. Do not wear sandals. If you use a stepladder: Make sure that it is fully opened. Do not climb a closed stepladder. Make sure that both sides of the stepladder are locked into place. Ask someone to hold it for you, if possible. Clearly mark and make sure that you can see: Any grab bars or handrails. First and last steps. Where the edge of each step is. Use tools that  help you move around (mobility aids) if they are needed. These include: Canes. Walkers. Scooters. Crutches. Turn on the lights when you go into a dark area. Replace any light bulbs as soon as they burn out. Set up your furniture so you have a clear path. Avoid moving your furniture around. If any of your floors are uneven, fix them. If there are any pets around you, be aware of where they are. Review your medicines with your doctor. Some medicines can make you feel dizzy. This can increase your chance of falling. Ask your doctor what other things that you can do to help prevent falls. This information is not intended to replace advice given to you by your health care provider. Make sure you discuss any questions you have with your health care provider. Document Released: 10/19/2008 Document Revised: 05/31/2015 Document Reviewed: 01/27/2014 Elsevier Interactive Patient Education  2017 [employer].

## 2021-02-04 DIAGNOSIS — H25813 Combined forms of age-related cataract, bilateral: Secondary | ICD-10-CM | POA: Diagnosis not present

## 2021-02-04 DIAGNOSIS — H2513 Age-related nuclear cataract, bilateral: Secondary | ICD-10-CM | POA: Diagnosis not present

## 2021-02-13 ENCOUNTER — Ambulatory Visit: Payer: Self-pay

## 2021-02-13 NOTE — Telephone Encounter (Signed)
Summary: COVID positive/fever has not checked temperature, chills body aches, and nausea.   Pt stated tested positive for COVID today. Pt stated he possibly may have a fever has not checked temperature, chills body aches, and nausea.    No appointments available until tomorrow.   Seeking clinical advice.      Chief Complaint: COVID positive Symptoms: Cough, chills, fever,body aches Frequency: Started yesterday Pertinent Negatives: Patient denies SOB Disposition: [] ED /[] Urgent Care (no appt availability in office) / [x] Appointment(In office/virtual)/ []  [facility]/ [] Home Care/ [] Refused Recommended Disposition /[] [place] [facility]/ []  Follow-up with PCP Additional Notes:   Answer Assessment - Initial Assessment Questions 1. COVID-19 DIAGNOSIS: "Who made your COVID-19 diagnosis?" "Was it confirmed by a positive lab test or self-test?" If not diagnosed by a doctor (or NP/PA), ask "Are there lots of cases (community spread) where you live?" Note: See public health department website, if unsure.     Home test 2. COVID-19 EXPOSURE: "Was there any known exposure to COVID before the symptoms began?" CDC Definition of close contact: within 6 feet (2 meters) for a total of 15 minutes or more over a 24-hour period.      No 3. ONSET: "When did the COVID-19 symptoms start?"      Yesterday 4. WORST SYMPTOM: "What is your worst symptom?" (e.g., cough, fever, shortness of breath, muscle aches)     Body aches 5. COUGH: "Do you have a cough?" If Yes, ask: "How bad is the cough?"       Mild 6. FEVER: "Do you have a fever?" If Yes, ask: "What is your temperature, how was it measured, and when did it start?"     Yes 7. RESPIRATORY STATUS: "Describe your breathing?" (e.g., shortness of breath, wheezing, unable to speak)      No 8. BETTER-SAME-WORSE: "Are you getting better, staying the same or getting worse compared to yesterday?"  If getting worse, ask, "In what way?"     Same 9. HIGH  RISK DISEASE: "Do you have any chronic medical problems?" (e.g., asthma, heart or lung disease, weak immune system, obesity, etc.)     No 10. VACCINE: "Have you had the COVID-19 vaccine?" If Yes, ask: "Which one, how many shots, when did you get it?"       N/a 11. BOOSTER: "Have you received your COVID-19 booster?" If Yes, ask: "Which one and when did you get it?"       N/a 12. PREGNANCY: "Is there any chance you are pregnant?" "When was your last menstrual period?"       N/a 13. OTHER SYMPTOMS: "Do you have any other symptoms?"  (e.g., chills, fatigue, headache, loss of smell or taste, muscle pain, sore throat)       Chills, body aches 14. O2 SATURATION MONITOR:  "Do you use an oxygen saturation monitor (pulse oximeter) at home?" If Yes, ask "What is your reading (oxygen level) today?" "What is your usual oxygen saturation reading?" (e.g., 95%)       No  Protocols used: Coronavirus (COVID-19) Diagnosed or Suspected-A-AH

## 2021-02-14 ENCOUNTER — Other Ambulatory Visit: Payer: Self-pay

## 2021-02-14 ENCOUNTER — Telehealth (INDEPENDENT_AMBULATORY_CARE_PROVIDER_SITE_OTHER): Payer: Medicare PPO | Admitting: Family Medicine

## 2021-02-14 DIAGNOSIS — U071 COVID-19: Secondary | ICD-10-CM | POA: Diagnosis not present

## 2021-02-14 DIAGNOSIS — R058 Other specified cough: Secondary | ICD-10-CM | POA: Insufficient documentation

## 2021-02-14 DIAGNOSIS — R509 Fever, unspecified: Secondary | ICD-10-CM | POA: Insufficient documentation

## 2021-02-14 DIAGNOSIS — R5081 Fever presenting with conditions classified elsewhere: Secondary | ICD-10-CM | POA: Diagnosis not present

## 2021-02-14 HISTORY — DX: COVID-19: U07.1

## 2021-02-14 MED ORDER — GUAIFENESIN-DM 100-10 MG/5ML PO SYRP: 5.0000 mL | ORAL_SOLUTION | ORAL | 0 refills | Status: DC | PRN

## 2021-02-14 MED ORDER — NIRMATRELVIR/RITONAVIR (PAXLOVID)TABLET: 3.0000 | ORAL_TABLET | Freq: Two times a day (BID) | ORAL | 0 refills | Status: AC

## 2021-02-14 NOTE — Assessment & Plan Note (Signed)
Encourage use of cough syrup to assist with paxlovid Encourage intake of increased water to assist

## 2021-02-14 NOTE — Progress Notes (Signed)
MyChart Video Visit    Virtual Visit via Video Note   This visit type was conducted due to national recommendations for restrictions regarding the COVID-19 Pandemic (e.g. social distancing) in an effort to limit this patient's exposure and mitigate transmission in our community. This patient is at least at moderate risk for complications without adequate follow up. This format is felt to be most appropriate for this patient at this time. Physical exam was limited by quality of the video and audio technology used for the visit.   Patient location: home Provider location: office  I discussed the limitations of evaluation and management by telemedicine and the availability of in person appointments. The patient expressed understanding and agreed to proceed.  Patient: Micheal Mccoy   DOB: 1955/10/05   66 y.o. Male  MRN: [id number] Visit Date: 02/14/2021  Today's healthcare provider: Gwyneth Sprout, FNP   No chief complaint on file.  Subjective    HPI  Patient presents today via video visit for treatment of Covid-19.  Patient started having symptoms on 02/11/21.  He tested positive for Covid yesterday.   He is quarantined at home and isolating from his wife as she is not sick at this time.   He has been using Tylenol only for his symptoms and is interested in getting treated with an antiviral. He states he has fever but has not been able to actually take his temperature.   Medications: Outpatient Medications Prior to Visit  Medication Sig   tadalafil (CIALIS) 20 MG tablet Take 1 tablet (20 mg total) by mouth daily. Or take every other day for ED   No facility-administered medications prior to visit.    Review of Systems  Constitutional:  Positive for chills and fever. Negative for diaphoresis and fatigue.  HENT:  Positive for sore throat and voice change. Negative for congestion, ear discharge, ear pain, hearing loss, nosebleeds, postnasal drip, rhinorrhea, sinus pressure, sinus  pain, sneezing, tinnitus and trouble swallowing.   Eyes:  Negative for photophobia, pain, discharge, redness, itching and visual disturbance.  Respiratory:  Positive for cough (non productive). Negative for shortness of breath and wheezing.   Cardiovascular:  Negative for chest pain, palpitations and leg swelling.  Gastrointestinal:  Positive for abdominal pain (2 days ago, now resolved) and vomiting (2 days ago, now resolved). Negative for constipation, diarrhea and nausea.  Musculoskeletal:  Positive for myalgias. Negative for arthralgias and joint swelling.  Neurological:  Positive for headaches. Negative for dizziness and light-headedness.     Objective    There were no vitals taken for this visit.   Physical Exam Constitutional:      General: He is not in acute distress.    Appearance: Normal appearance. He is normal weight. He is not ill-appearing, toxic-appearing or diaphoretic.  Pulmonary:     Effort: Pulmonary effort is normal.  Neurological:     General: No focal deficit present.     Mental Status: He is alert and oriented to person, place, and time.  Psychiatric:        Mood and Affect: Mood normal.        Behavior: Behavior normal.        Thought Content: Thought content normal.        Judgment: Judgment normal.       Assessment & Plan     Problem List Items Addressed This Visit       Other   COVID-19 - Primary    Request paxlovid, antiviral  Recommend increase fluids Reviewed quarantine Report of increased mucus production       Relevant Medications   nirmatrelvir/ritonavir EUA (PAXLOVID) 20 x 150 MG & 10 x 100MG  TABS   Productive cough    Encourage use of cough syrup to assist with paxlovid Encourage intake of increased water to assist      Fever    No formal reading, has since improved Made aware that cannot come out of quarantine unless his fever is absent for 24+ hours on day 5        No follow-ups on file.     I discussed the assessment  and treatment plan with the patient. The patient was provided an opportunity to ask questions and all were answered. The patient agreed with the plan and demonstrated an understanding of the instructions.   The patient was advised to call back or seek an in-person evaluation if the symptoms worsen or if the condition fails to improve as anticipated.  Argentina Ponder DeSanto,acting as a scribe for Gwyneth Sprout, FNP.,have documented all relevant documentation on the behalf of Gwyneth Sprout, FNP,as directed by  Gwyneth Sprout, FNP while in the presence of Gwyneth Sprout, FNP.  I provided 7 minutes of non-face-to-face time during this encounter.  Vonna Kotyk, FNP, have reviewed all documentation for this visit. The documentation on 02/14/21 for the exam, diagnosis, procedures, and orders are all accurate and complete.   Gwyneth Sprout, [place] [phone] (phone) [phone] (fax)  [place]

## 2021-02-14 NOTE — Assessment & Plan Note (Signed)
No formal reading, has since improved Made aware that cannot come out of quarantine unless his fever is absent for 24+ hours on day 5

## 2021-02-14 NOTE — Assessment & Plan Note (Signed)
Request paxlovid, antiviral Recommend increase fluids Reviewed quarantine Report of increased mucus production

## 2021-02-20 DIAGNOSIS — Z20822 Contact with and (suspected) exposure to covid-19: Secondary | ICD-10-CM | POA: Diagnosis not present

## 2021-02-25 DIAGNOSIS — Z20822 Contact with and (suspected) exposure to covid-19: Secondary | ICD-10-CM | POA: Diagnosis not present

## 2021-02-25 DIAGNOSIS — Z03818 Encounter for observation for suspected exposure to other biological agents ruled out: Secondary | ICD-10-CM | POA: Diagnosis not present

## 2021-03-13 DIAGNOSIS — H52222 Regular astigmatism, left eye: Secondary | ICD-10-CM | POA: Diagnosis not present

## 2021-03-13 DIAGNOSIS — H2512 Age-related nuclear cataract, left eye: Secondary | ICD-10-CM | POA: Diagnosis not present

## 2021-03-14 DIAGNOSIS — H25811 Combined forms of age-related cataract, right eye: Secondary | ICD-10-CM | POA: Diagnosis not present

## 2021-03-20 DIAGNOSIS — H52221 Regular astigmatism, right eye: Secondary | ICD-10-CM | POA: Diagnosis not present

## 2021-03-20 DIAGNOSIS — H2511 Age-related nuclear cataract, right eye: Secondary | ICD-10-CM | POA: Diagnosis not present

## 2021-07-22 NOTE — Progress Notes (Signed)
07/23/2021 8:54 AM   [name] 1955/12/20 [id number]  Referring provider: [name], [place],  [place] [address]  Urological history: 1. Peyronie's disease - s/p plication 4403  2. BPH with LU TS -PSA, 09/2020 - 1.4 -s/p HoLEP, 02/2019 - negative pathology - I PSS ***  3. ED - contributing factors of age, BPH, relationship issues and Peyronie's disease - SHIM *** - managed with tadalafil 20 mg, daily   4. High risk hematuria - non smoker - CTU 2017 - showed 130 gm prostate. It also showed a benign Bosniak 2 left upper pole cyst - cysto 2017 Enlarged prostate - 6 cm in length. Visually obstructive and intravesical lobe of prostate - cysto during HoLEP 02/2019 no suspicious lesions - CTU 02/2020 - Multiple renal lesions bilaterally, largest of which are classified as either simple (Bosniak class 1), or proteinaceous/hemorrhagic (Bosniak class 2) cysts. Smaller lesions are too small to definitively characterize, but also favored to represent tiny cysts. Should the patient's hematuria persist or worsen, definitive characterization with abdominal MRI with and without IV gadolinium could be  considered. - MRI, 09/2020 - Cysts in the bilateral kidneys, largest in the upper pole of the LEFT kidney measuring 3.6 cm. No suspicious features. Classified as Bosniak category I and II. - cysto, 10/2020 - NED - no reports of gross heme  5. Elevated PSA - PSA Trend Component     Latest Ref Rng & Units 11/22/2015 05/21/2016 11/17/2016 05/21/2017  Prostate Specific Ag, Serum     0.0 - 4.0 ng/mL 2.3 3.3 4.3 (H) 3.9   Component     Latest Ref Rng & Units 11/20/2017 11/16/2018 11/30/2018 12/12/2019  Prostate Specific Ag, Serum     0.0 - 4.0 ng/mL 3.1 5.5 (H) 7.0 (H) 1.6   Component     Latest Ref Rng & Units 09/13/2020  Prostate Specific Ag, Serum     0.0 - 4.0 ng/mL 1.4   Prostate biopsy iPSA 4.2 - negative  Prostate MRI 12/2018 for PSA 7.  No  findings suspicious for macroscopic prostate cancer.  PI-RADS 1.  BPH.  Calculated prostate volume 87 mL Benign tissue with HoLEP 02/2019  Micheal Mccoy is a 66 y.o. male who presents today for 9 month follow up.       Score:  1-7 Mild 8-19 Moderate 20-35 Severe          Score: 1-7 Severe ED 8-11 Moderate ED 12-16 Mild-Moderate ED 17-21 Mild ED 22-25 No ED    PMH: Past Medical History:  Diagnosis Date   Arthritis    Benign neoplasm of ascending colon    Degenerative disc disease, cervical    Esophagitis, reflux 09/29/2005   Frequency of urination    GERD (gastroesophageal reflux disease)    Peyronie's disease    W/ PENILE CURVATURE    Surgical History: Past Surgical History:  Procedure Laterality Date   ANTERIOR CERVICAL DECOMP/DISCECTOMY FUSION N/A 01/25/2018   Procedure: ANTERIOR CERVICAL DECOMPRESSION/DISCECTOMY FUSION 1 LEVEL C7-T1, T1-T2;  Surgeon: Deetta Perla, MD;  Location: [facility];  Service: Neurosurgery;  Laterality: N/A;   ANTERIOR CERVICAL DISCECTOMY  01/25/2018   Dr. Lacinda Axon, ACDF ARTHRODESIS, OSTEOPHYTECTOMY &DECOMPRESSION OF SPINAL CORD AND/ORNERVE ROOTS; BELOW C2-- C7-T1    COLONOSCOPY WITH PROPOFOL N/A 09/14/2014   Procedure: COLONOSCOPY WITH PROPOFOL;  Surgeon: Lucilla Lame, MD;  Location: [place];  Service: Endoscopy;  Laterality: N/A;   COLONOSCOPY WITH PROPOFOL N/A 01/31/2020  Procedure: COLONOSCOPY WITH PROPOFOL;  Surgeon: Lucilla Lame, MD;  Location: [facility];  Service: Endoscopy;  Laterality: N/A;   HERNIA REPAIR Bilateral 04/09/2015   Bard 3D Max mesh for bilateral inguinal hernias, primary repair of umbilical henria.    HOLEP-LASER ENUCLEATION OF THE PROSTATE WITH MORCELLATION N/A 02/17/2019   Procedure: HOLEP-LASER ENUCLEATION OF THE PROSTATE WITH MORCELLATION;  Surgeon: Billey Co, MD;  Location: [facility];  Service: Urology;  Laterality: N/A;   INGUINAL HERNIA REPAIR Bilateral 04/09/2015   Procedure:  LAPAROSCOPIC INGUINAL HERNIA;  Surgeon: Robert Bellow, MD;  Location: [facility];  Service: General;  Laterality: Bilateral;   NESBIT PROCEDURE N/A 06/28/2012   Procedure: 16 DOT PLACTATION PROCEDURE;  Surgeon: Claybon Jabs, MD;  Location: [facility];  Service: Urology;  Laterality: N/A;   Peyronies Disease  06/2012   POLYPECTOMY  09/14/2014   Procedure: POLYPECTOMY;  Surgeon: Lucilla Lame, MD;  Location: [place];  Service: Endoscopy;;   PROSTATE BIOPSY  03-02-15   neg   THORACIC DISCECTOMY  01/25/2018   Dr. Lacinda Axon, ARTHRODESIS ANTERIOR INTERBODY W/DISCECTOMY ADDITIONAL LEVEL O2-H4   UMBILICAL HERNIA REPAIR N/A 04/09/2015   Procedure: HERNIA REPAIR UMBILICAL ADULT;  Surgeon: Robert Bellow, MD;  Location: [facility];  Service: General;  Laterality: N/A;    Home Medications:  Allergies as of 07/23/2021       Reactions   Aleve [naproxen] Hives        Medication List        Accurate as of July 22, 2021  8:54 AM. If you have any questions, ask your nurse or doctor.          guaiFENesin-dextromethorphan 100-10 MG/5ML syrup Commonly known as: ROBITUSSIN DM Take 5 mLs by mouth every 4 (four) hours as needed for cough.   tadalafil 20 MG tablet Commonly known as: CIALIS Take 1 tablet (20 mg total) by mouth daily. Or take every other day for ED        Allergies:  Allergies  Allergen Reactions   Aleve [Naproxen] Hives    Family History: Family History  Problem Relation Age of Onset   Prostate cancer Neg Hx    Bladder Cancer Neg Hx    Kidney cancer Neg Hx     Social History:  reports that he has never smoked. He has never used smokeless tobacco. He reports that he does not drink alcohol and does not use drugs.  ROS: Pertinent ROS in HPI  Physical Exam: There were no vitals taken for this visit.  Constitutional:  Well nourished. Alert and oriented, No acute distress. HEENT: [place] AT, moist mucus membranes.  Trachea midline Cardiovascular: No  clubbing, cyanosis, or edema. Respiratory: Normal respiratory effort, no increased work of breathing. GU: No CVA tenderness.  No bladder fullness or masses.  Patient with circumcised/uncircumcised phallus. ***Foreskin easily retracted***  Urethral meatus is patent.  No penile discharge. No penile lesions or rashes. Scrotum without lesions, cysts, rashes and/or edema.  Testicles are located scrotally bilaterally. No masses are appreciated in the testicles. Left and right epididymis are normal. Rectal: Patient with  normal sphincter tone. Anus and perineum without scarring or rashes. No rectal masses are appreciated. Prostate is approximately *** grams, *** nodules are appreciated. Seminal vesicles are normal. Neurologic: Grossly intact, no focal deficits, moving all 4 extremities. Psychiatric: Normal mood and affect.    Laboratory Data: N/A   Pertinent Imaging: N/A   Assessment & Plan:    1. Erectile dysfunction -  Cialis 20 mg every other day is effective for satisfactory erections  2. SUI -Completed physical therapy -Having issues with stress incontinence with intercourse -will reassess once systems completed  3. Gross hematuria/renal cysts -cysto x 2 - NED -CTU 48307 -bilateral cysts -MRI 09/2020-bilateral cysts  -no reports of gross heme  4. BPH  - s/p HoLEP 02/2019 -voiding well   No follow-ups on file.  These notes generated with voice recognition software. I apologize for typographical errors.  Zara Council, PA-C  [facility] [address]  [place], [place] [address] [phone]

## 2021-07-23 ENCOUNTER — Ambulatory Visit (INDEPENDENT_AMBULATORY_CARE_PROVIDER_SITE_OTHER): Payer: Medicare PPO | Admitting: Urology

## 2021-07-23 ENCOUNTER — Encounter: Payer: Self-pay | Admitting: Urology

## 2021-07-23 VITALS — BP 147/81 | HR 50 | Ht 75.0 in | Wt 187.0 lb

## 2021-07-23 DIAGNOSIS — N529 Male erectile dysfunction, unspecified: Secondary | ICD-10-CM

## 2021-07-23 DIAGNOSIS — N401 Enlarged prostate with lower urinary tract symptoms: Secondary | ICD-10-CM

## 2021-07-23 DIAGNOSIS — R319 Hematuria, unspecified: Secondary | ICD-10-CM

## 2021-07-23 DIAGNOSIS — N138 Other obstructive and reflux uropathy: Secondary | ICD-10-CM | POA: Diagnosis not present

## 2021-07-23 DIAGNOSIS — N393 Stress incontinence (female) (male): Secondary | ICD-10-CM

## 2021-07-23 DIAGNOSIS — H1131 Conjunctival hemorrhage, right eye: Secondary | ICD-10-CM | POA: Diagnosis not present

## 2021-07-23 DIAGNOSIS — Z961 Presence of intraocular lens: Secondary | ICD-10-CM | POA: Diagnosis not present

## 2021-07-23 DIAGNOSIS — H43393 Other vitreous opacities, bilateral: Secondary | ICD-10-CM | POA: Diagnosis not present

## 2021-07-23 MED ORDER — MIRABEGRON ER 25 MG PO TB24: 25.0000 mg | ORAL_TABLET | Freq: Every day | ORAL | 0 refills | Status: DC

## 2021-07-23 NOTE — Addendum Note (Signed)
Addended by: Evelina Bucy on: 07/23/2021 04:34 PM   Modules accepted: Orders

## 2021-09-19 ENCOUNTER — Ambulatory Visit: Payer: Medicare PPO | Admitting: Urology

## 2021-09-24 ENCOUNTER — Other Ambulatory Visit: Payer: Medicare PPO

## 2021-09-24 DIAGNOSIS — N401 Enlarged prostate with lower urinary tract symptoms: Secondary | ICD-10-CM | POA: Diagnosis not present

## 2021-09-24 DIAGNOSIS — N138 Other obstructive and reflux uropathy: Secondary | ICD-10-CM

## 2021-09-26 ENCOUNTER — Ambulatory Visit: Payer: Medicare PPO | Admitting: Urology

## 2021-09-26 ENCOUNTER — Encounter: Payer: Self-pay | Admitting: Family Medicine

## 2021-09-26 LAB — PSA: Prostate Specific Ag, Serum: 1.2 ng/mL (ref 0.0–4.0)

## 2021-10-01 NOTE — Progress Notes (Unsigned)
10/02/2021 5:57 PM   Micheal Mccoy 05-Sep-1955 [id number]  Referring provider: [name], MD [address] [place],   [address]  Urological history: 1. Peyronie's disease - s/p plication 7169  2. BPH with LU TS -PSA (09/2021) 1.2 -s/p HoLEP, 02/2019 - negative pathology - I PSS ***  3. ED - contributing factors of age, BPH, relationship issues and Peyronie's disease - SHIM *** - tadalafil 20 mg, daily   4. High risk hematuria - non smoker - CTU 2017 - showed 130 gm prostate. It also showed a benign Bosniak 2 left upper pole cyst - cysto 2017 Enlarged prostate - 6 cm in length. Visually obstructive and intravesical lobe of prostate - cysto during HoLEP 02/2019 no suspicious lesions - CTU 02/2020 - Multiple renal lesions bilaterally, largest of which are classified as either simple (Bosniak class 1), or proteinaceous/hemorrhagic (Bosniak class 2) cysts. Smaller lesions are too small to definitively characterize, but also favored to represent tiny cysts. Should the patient's hematuria persist or worsen, definitive characterization with abdominal MRI with and without IV gadolinium could be  considered. - MRI, 09/2020 - Cysts in the bilateral kidneys, largest in the upper pole of the LEFT kidney measuring 3.6 cm. No suspicious features. Classified as Bosniak category I and II. - cysto, 10/2020 - NED - no reports of gross heme -UA ***  5. Elevated PSA - PSA Trend Legend: (H) High Component     Latest Ref Rng & Units 11/22/2015 05/21/2016  Prostate Specific Ag, Serum     0.0 - 4.0 ng/mL 2.3 3.3    Prostate Specific Ag, Serum  Latest Ref Rng 0.0 - 4.0 ng/mL  11/17/2016 4.3 (H)   05/21/2017 3.9   11/20/2017 3.1   11/16/2018 5.5 (H)   11/30/2018 7.0 (H)   12/12/2019 1.6   09/13/2020 1.4   09/24/2021 1.2     -Prostate biopsy iPSA 4.2 - negative  -Prostate MRI 12/2018 for PSA 7.  No findings suspicious for macroscopic prostate cancer.  PI-RADS 1.   BPH.  -Calculated prostate volume 87 mL -Benign tissue with HoLEP 02/2019  HPI: Micheal Mccoy is a 66 y.o. male who presents today for yearly follow up.         Score:  1-7 Mild 8-19 Moderate 20-35 Severe       Score: 1-7 Severe ED 8-11 Moderate ED 12-16 Mild-Moderate ED 17-21 Mild ED 22-25 No ED    PMH: Past Medical History:  Diagnosis Date   Arthritis    Benign neoplasm of ascending colon    Degenerative disc disease, cervical    Esophagitis, reflux 09/29/2005   Frequency of urination    GERD (gastroesophageal reflux disease)    Peyronie's disease    W/ PENILE CURVATURE    Surgical History: Past Surgical History:  Procedure Laterality Date   ANTERIOR CERVICAL DECOMP/DISCECTOMY FUSION N/A 01/25/2018   Procedure: ANTERIOR CERVICAL DECOMPRESSION/DISCECTOMY FUSION 1 LEVEL C7-T1, T1-T2;  Surgeon: Deetta Perla, MD;  Location: [facility];  Service: Neurosurgery;  Laterality: N/A;   ANTERIOR CERVICAL DISCECTOMY  01/25/2018   Dr. Lacinda Axon, ACDF ARTHRODESIS, OSTEOPHYTECTOMY &DECOMPRESSION OF SPINAL CORD AND/ORNERVE ROOTS; BELOW C2-- C7-T1    COLONOSCOPY WITH PROPOFOL N/A 09/14/2014   Procedure: COLONOSCOPY WITH PROPOFOL;  Surgeon: Lucilla Lame, MD;  Location: [place];  Service: Endoscopy;  Laterality: N/A;   COLONOSCOPY WITH PROPOFOL N/A 01/31/2020   Procedure: COLONOSCOPY WITH PROPOFOL;  Surgeon: Lucilla Lame, MD;  Location: [facility];  Service:  Endoscopy;  Laterality: N/A;   HERNIA REPAIR Bilateral 04/09/2015   Bard 3D Max mesh for bilateral inguinal hernias, primary repair of umbilical henria.    HOLEP-LASER ENUCLEATION OF THE PROSTATE WITH MORCELLATION N/A 02/17/2019   Procedure: HOLEP-LASER ENUCLEATION OF THE PROSTATE WITH MORCELLATION;  Surgeon: Billey Co, MD;  Location: [facility];  Service: Urology;  Laterality: N/A;   INGUINAL HERNIA REPAIR Bilateral 04/09/2015   Procedure: LAPAROSCOPIC INGUINAL HERNIA;  Surgeon: Robert Bellow, MD;  Location: [facility];  Service: General;  Laterality: Bilateral;   NESBIT PROCEDURE N/A 06/28/2012   Procedure: 16 DOT PLACTATION PROCEDURE;  Surgeon: Claybon Jabs, MD;  Location[facility];  Service: Urology;  Laterality: N/A;   Peyronies Disease  06/2012   POLYPECTOMY  09/14/2014   Procedure: POLYPECTOMY;  Surgeon: Lucilla Lame, MD;  Location: [place];  Service: Endoscopy;;   PROSTATE BIOPSY  03-02-15   neg   THORACIC DISCECTOMY  01/25/2018   Dr. Lacinda Axon, ARTHRODESIS ANTERIOR INTERBODY W/DISCECTOMY ADDITIONAL LEVEL L2-G4   UMBILICAL HERNIA REPAIR N/A 04/09/2015   Procedure: HERNIA REPAIR UMBILICAL ADULT;  Surgeon: Robert Bellow, MD;  Location: [facility];  Service: General;  Laterality: N/A;    Home Medications:  Allergies as of 10/02/2021       Reactions   Aleve [naproxen] Hives        Medication List        Accurate as of October 01, 2021  5:57 PM. If you have any questions, ask your nurse or doctor.          guaiFENesin-dextromethorphan 100-10 MG/5ML syrup Commonly known as: ROBITUSSIN DM Take 5 mLs by mouth every 4 (four) hours as needed for cough.   mirabegron ER 25 MG Tb24 tablet Commonly known as: MYRBETRIQ Take 1 tablet (25 mg total) by mouth daily.   tadalafil 20 MG tablet Commonly known as: CIALIS Take 1 tablet (20 mg total) by mouth daily. Or take every other day for ED        Allergies:  Allergies  Allergen Reactions   Aleve [Naproxen] Hives    Family History: Family History  Problem Relation Age of Onset   Prostate cancer Neg Hx    Bladder Cancer Neg Hx    Kidney cancer Neg Hx     Social History:  reports that he has never smoked. He has never used smokeless tobacco. He reports that he does not drink alcohol and does not use drugs.  ROS: Pertinent ROS in HPI  Physical Exam: There were no vitals taken for this visit.  Constitutional:  Well nourished. Alert and oriented, No acute distress. HEENT: [place] AT, moist mucus membranes.   Trachea midline Cardiovascular: No clubbing, cyanosis, or edema. Respiratory: Normal respiratory effort, no increased work of breathing. GU: No CVA tenderness.  No bladder fullness or masses.  Patient with circumcised/uncircumcised phallus. ***Foreskin easily retracted***  Urethral meatus is patent.  No penile discharge. No penile lesions or rashes. Scrotum without lesions, cysts, rashes and/or edema.  Testicles are located scrotally bilaterally. No masses are appreciated in the testicles. Left and Mccoy epididymis are normal. Rectal: Patient with  normal sphincter tone. Anus and perineum without scarring or rashes. No rectal masses are appreciated. Prostate is approximately *** grams, *** nodules are appreciated. Seminal vesicles are normal. Neurologic: Grossly intact, no focal deficits, moving all 4 extremities. Psychiatric: Normal mood and affect.   Laboratory Data:  Prostate Specific Ag, Serum  Latest Ref Rng 0.0 - 4.0  ng/mL  11/17/2016 4.3 (H)   05/21/2017 3.9   11/20/2017 3.1   11/16/2018 5.5 (H)   11/30/2018 7.0 (H)   12/12/2019 1.6   09/13/2020 1.4   09/24/2021 1.2     Legend: (H) High  Urinalysis *** I have reviewed the labs.    Pertinent Imaging: N/A   Assessment & Plan:    1. Erectile dysfunction - continue Cialis 20 mg every other day   2. SUI -Completed physical therapy -Having issues with stress incontinence with intercourse, during showers and having post-void dribbling -Have a trial of Myrbetriq 25 mg daily, #28 samples given-if he finds effective will call back for more samples or refill until his appointment in September  3. Gross hematuria/renal cysts -cysto x 2 - NED -CTU 61443 -bilateral cysts -MRI 09/2020-bilateral cysts  -no reports of gross heme -UA ***  4. BPH  - s/p HoLEP 02/2019 -voiding well   No follow-ups on file.  These notes generated with voice recognition software. I apologize for typographical errors.  Zara Council,  PA-C  [facility] [address]  [place], [place] [address] [phone]

## 2021-10-02 ENCOUNTER — Encounter: Payer: Self-pay | Admitting: Urology

## 2021-10-02 ENCOUNTER — Ambulatory Visit (INDEPENDENT_AMBULATORY_CARE_PROVIDER_SITE_OTHER): Payer: Medicare PPO | Admitting: Urology

## 2021-10-02 VITALS — BP 138/83 | HR 65 | Ht 75.0 in | Wt 188.0 lb

## 2021-10-02 DIAGNOSIS — N393 Stress incontinence (female) (male): Secondary | ICD-10-CM | POA: Diagnosis not present

## 2021-10-02 DIAGNOSIS — R319 Hematuria, unspecified: Secondary | ICD-10-CM

## 2021-10-02 DIAGNOSIS — N401 Enlarged prostate with lower urinary tract symptoms: Secondary | ICD-10-CM | POA: Diagnosis not present

## 2021-10-02 DIAGNOSIS — N529 Male erectile dysfunction, unspecified: Secondary | ICD-10-CM

## 2021-10-02 DIAGNOSIS — N138 Other obstructive and reflux uropathy: Secondary | ICD-10-CM

## 2021-10-02 DIAGNOSIS — N5239 Other post-surgical erectile dysfunction: Secondary | ICD-10-CM

## 2021-10-02 LAB — URINALYSIS, COMPLETE
Bilirubin, UA: NEGATIVE
Glucose, UA: NEGATIVE
Ketones, UA: NEGATIVE
Leukocytes,UA: NEGATIVE
Nitrite, UA: NEGATIVE
Protein,UA: NEGATIVE
Specific Gravity, UA: 1.02 (ref 1.005–1.030)
Urobilinogen, Ur: 2 mg/dL — ABNORMAL HIGH (ref 0.2–1.0)
pH, UA: 8.5 — ABNORMAL HIGH (ref 5.0–7.5)

## 2021-10-02 LAB — MICROSCOPIC EXAMINATION

## 2021-10-02 MED ORDER — TADALAFIL 20 MG PO TABS: 20.0000 mg | ORAL_TABLET | Freq: Every day | ORAL | 3 refills | Status: DC

## 2021-11-06 ENCOUNTER — Encounter: Payer: Self-pay | Admitting: Family Medicine

## 2021-11-06 ENCOUNTER — Ambulatory Visit (INDEPENDENT_AMBULATORY_CARE_PROVIDER_SITE_OTHER): Payer: Medicare PPO | Admitting: Family Medicine

## 2021-11-06 VITALS — BP 135/89 | HR 60 | Temp 97.9°F | Resp 16 | Ht 75.0 in | Wt 187.0 lb

## 2021-11-06 DIAGNOSIS — M26622 Arthralgia of left temporomandibular joint: Secondary | ICD-10-CM

## 2021-11-06 DIAGNOSIS — N400 Enlarged prostate without lower urinary tract symptoms: Secondary | ICD-10-CM | POA: Diagnosis not present

## 2021-11-06 DIAGNOSIS — R030 Elevated blood-pressure reading, without diagnosis of hypertension: Secondary | ICD-10-CM

## 2021-11-06 DIAGNOSIS — M25542 Pain in joints of left hand: Secondary | ICD-10-CM | POA: Diagnosis not present

## 2021-11-06 DIAGNOSIS — Z0000 Encounter for general adult medical examination without abnormal findings: Secondary | ICD-10-CM | POA: Diagnosis not present

## 2021-11-06 DIAGNOSIS — Z974 Presence of external hearing-aid: Secondary | ICD-10-CM

## 2021-11-06 DIAGNOSIS — M25541 Pain in joints of right hand: Secondary | ICD-10-CM | POA: Diagnosis not present

## 2021-11-06 NOTE — Progress Notes (Signed)
I,Micheal Mccoy,acting as a scribe for Lelon Huh, MD.,have documented all relevant documentation on the behalf of Lelon Huh, MD,as directed by  Lelon Huh, MD while in the presence of Lelon Huh, MD.   Complete Physical Exam      Patient: Micheal Mccoy, Male    DOB: 1955/09/06, 66 y.o.   MRN: [id number] Visit Date: 11/06/2021  Today's Provider: Lelon Huh, MD   Chief Complaint  Patient presents with   Medicare Wellness   Subjective    Micheal Mccoy is a 66 y.o. male who presents today for his complete physical examination  He reports consuming a general diet. Home exercise routine includes walking and free weights. Gym/ health club routine includes weight lifting. He generally feels fairly well. He reports sleeping fairly well. He does have additional problems to discuss today. Main complaint is the fingers have been locking up. No popping, clicking or triggering, just get difficult to move and feel a little sore. Also gets soreness in left TMJ fairly often, worse with chewing.    Medications: Outpatient Medications Prior to Visit  Medication Sig   tadalafil (CIALIS) 20 MG tablet Take 1 tablet (20 mg total) by mouth daily. Or take every other day for ED   mirabegron ER (MYRBETRIQ) 25 MG TB24 tablet Take 1 tablet (25 mg total) by mouth daily. (Patient not taking: Reported on 11/06/2021)   [DISCONTINUED] guaiFENesin-dextromethorphan (ROBITUSSIN DM) 100-10 MG/5ML syrup Take 5 mLs by mouth every 4 (four) hours as needed for cough. (Patient not taking: Reported on 07/23/2021)   No facility-administered medications prior to visit.    Allergies  Allergen Reactions   Aleve [Naproxen] Hives    Patient Care Team: [name], MD as PCP - General (Family Medicine) Kate Sable, MD as PCP - Cardiology (Cardiology) Bary Castilla Forest Gleason, MD as Consulting Physician (General Surgery) Deetta Perla, MD as Consulting Physician (Neurosurgery) Billey Co, MD  as Consulting Physician (Urology) System, Provider Not In (Optometry) Throat, [place] (Audiology)  Review of Systems  Constitutional:  Negative for appetite change, chills, fatigue and fever.  HENT:  Positive for hearing loss. Negative for congestion, ear pain, nosebleeds and trouble swallowing.   Eyes:  Negative for pain and visual disturbance.  Respiratory:  Negative for cough, chest tightness and shortness of breath.   Cardiovascular:  Negative for chest pain, palpitations and leg swelling.  Gastrointestinal:  Negative for abdominal pain, blood in stool, constipation, diarrhea, nausea and vomiting.  Endocrine: Negative for polydipsia, polyphagia and polyuria.  Genitourinary:  Positive for frequency. Negative for dysuria and flank pain.  Musculoskeletal:  Negative for arthralgias, back pain, joint swelling, myalgias and neck stiffness.  Skin:  Negative for color change, rash and wound.  Neurological:  Negative for dizziness, tremors, seizures, speech difficulty, weakness, light-headedness and headaches.  Psychiatric/Behavioral:  Negative for behavioral problems, confusion, decreased concentration, dysphoric mood and sleep disturbance. The patient is not nervous/anxious.   All other systems reviewed and are negative.       Objective    Vitals: BP 135/89 (BP Location: Mccoy Arm, Patient Position: Sitting, Cuff Size: Large)   Pulse 60   Temp 97.9 F (36.6 C) (Oral)   Resp 16   Ht '6\' 3"'$  (1.905 m)   Wt 187 lb (84.8 kg)   SpO2 100% Comment: room air  BMI 23.37 kg/m    Today's Vitals   11/06/21 1020 11/06/21 1025  BP: (!) 142/81 135/89  Pulse: 70 60  Resp: 16   Temp: 97.9 F (36.6 C)   TempSrc: Oral   SpO2: 100%   Weight: 187 lb (84.8 kg)   Height: '6\' 3"'$  (1.905 m)    Body mass index is 23.37 kg/m.   Physical Exam   General Appearance:    Well developed, well nourished male. Alert, cooperative, in no acute distress, appears stated age  Head:     Normocephalic, without obvious abnormality, atraumatic  Eyes:    PERRL, conjunctiva/corneas clear, EOM's intact, fundi    benign, both eyes       Ears:    Normal TM's and external ear canals, both ears  Nose:   Nares normal, septum midline, mucosa normal, no drainage   or sinus tenderness  Throat:   Lips, mucosa, and tongue normal; teeth and gums normal  Neck:   Supple, symmetrical, trachea midline, no adenopathy;       thyroid:  No enlargement/tenderness/nodules; no carotid   bruit or JVD  Back:     Symmetric, no curvature, ROM normal, no CVA tenderness  Lungs:     Clear to auscultation bilaterally, respirations unlabored  Chest wall:    No tenderness or deformity  Heart:    Normal heart rate. Normal rhythm. No murmurs, rubs, or gallops.  S1 and S2 normal  Abdomen:     Soft, non-tender, bowel sounds active all four quadrants,    no masses, no organomegaly  Genitalia:    deferred  Rectal:    deferred  Extremities:   All extremities are intact. No cyanosis or edema. Some stiffness of fingers of both hands, but no swelling, erythema, or triggering.   Pulses:   2+ and symmetric all extremities  Skin:   Skin color, texture, turgor normal, no rashes or lesions  Lymph nodes:   Cervical, supraclavicular, and axillary nodes normal  Neurologic:   CNII-XII intact. Normal strength, sensation and reflexes      throughout     Most recent functional status assessment:    11/06/2021   10:27 AM  In your present state of health, do you have any difficulty performing the following activities:  Hearing? 1  Vision? 0  Difficulty concentrating or making decisions? 0  Walking or climbing stairs? 0  Dressing or bathing? 0  Doing errands, shopping? 0   Most recent fall risk assessment:    11/06/2021   10:27 AM  Fall Risk   Falls in the past year? 0  Number falls in past yr: 0  Injury with Fall? 0  Risk for fall due to : No Fall Risks  Follow up Falls evaluation completed    Most recent  depression screenings:    11/06/2021   10:27 AM 12/27/2020   11:02 AM  PHQ 2/9 Scores  PHQ - 2 Score 0 0  PHQ- 9 Score 0    Most recent cognitive screening:    12/26/2019    9:17 AM  6CIT Screen  What Year? 0 points  What month? 0 points  What time? 0 points  Count back from 20 0 points  Months in reverse 0 points  Repeat phrase 0 points  Total Score 0 points   Most recent Audit-C alcohol use screening    11/06/2021   10:27 AM  Alcohol Use Disorder Test (AUDIT)  1. How often do you have a drink containing alcohol? 0  2. How many drinks containing alcohol do you have on a typical day when you are drinking? 0  3. How often  do you have six or more drinks on one occasion? 0  AUDIT-C Score 0   A score of 3 or more in women, and 4 or more in men indicates increased risk for alcohol abuse, EXCEPT if all of the points are from question 1   No results found for any visits on 11/06/21.  Assessment & Plan     1. Annual physical exam   2. Prostate enlargement Normal PSA in September. Continue follow up with urology.   3. Wears hearing aid in both ears   4. Elevated blood pressure reading Work on avoid salty foods in diet, continue regular exercise.  Follow up BP check once or twice a year.  - CBC - Comprehensive metabolic panel - TSH  5. Arthralgia of both hands Likely OA. May take OTC ibuprofen which he has taken in past without problems, or can call for prescription ibuprofen, but is allergic to naprosxyn.   6. Arthralgia of left temporomandibular joint      The entirety of the information documented in the History of Present Illness, Review of Systems and Physical Exam were personally obtained by me. Portions of this information were initially documented by the CMA and reviewed by me for thoroughness and accuracy.     Lelon Huh, MD  [facility] [phone] (phone) [phone] (fax)  [place]

## 2021-11-06 NOTE — Patient Instructions (Signed)
.   Please review the attached list of medications and notify my office if there are any errors.   . Please bring all of your medications to every appointment so we can make sure that our medication list is the same as yours.   

## 2021-11-07 LAB — TSH: TSH: 1.47 u[IU]/mL (ref 0.450–4.500)

## 2021-11-07 LAB — COMPREHENSIVE METABOLIC PANEL
ALT: 11 IU/L (ref 0–44)
AST: 17 IU/L (ref 0–40)
Albumin/Globulin Ratio: 1.7 (ref 1.2–2.2)
Albumin: 4.5 g/dL (ref 3.9–4.9)
Alkaline Phosphatase: 63 IU/L (ref 44–121)
BUN/Creatinine Ratio: 9 — ABNORMAL LOW (ref 10–24)
BUN: 8 mg/dL (ref 8–27)
Bilirubin Total: 0.6 mg/dL (ref 0.0–1.2)
CO2: 26 mmol/L (ref 20–29)
Calcium: 9.7 mg/dL (ref 8.6–10.2)
Chloride: 103 mmol/L (ref 96–106)
Creatinine, Ser: 0.94 mg/dL (ref 0.76–1.27)
Globulin, Total: 2.7 g/dL (ref 1.5–4.5)
Glucose: 93 mg/dL (ref 70–99)
Potassium: 4.7 mmol/L (ref 3.5–5.2)
Sodium: 140 mmol/L (ref 134–144)
Total Protein: 7.2 g/dL (ref 6.0–8.5)
eGFR: 88 mL/min/{1.73_m2} (ref >59)

## 2021-11-07 LAB — CBC
Hematocrit: 46.1 % (ref 37.5–51.0)
Hemoglobin: 15.8 g/dL (ref 13.0–17.7)
MCH: 27.1 pg (ref 26.6–33.0)
MCHC: 34.3 g/dL (ref 31.5–35.7)
MCV: 79 fL (ref 79–97)
Platelets: 208 10*3/uL (ref 150–450)
RBC: 5.82 x10E6/uL — ABNORMAL HIGH (ref 4.14–5.80)
RDW: 13.1 % (ref 11.6–15.4)
WBC: 3.1 10*3/uL — ABNORMAL LOW (ref 3.4–10.8)

## 2022-01-01 DIAGNOSIS — H43393 Other vitreous opacities, bilateral: Secondary | ICD-10-CM | POA: Diagnosis not present

## 2022-01-01 DIAGNOSIS — H5202 Hypermetropia, left eye: Secondary | ICD-10-CM | POA: Diagnosis not present

## 2022-01-08 ENCOUNTER — Ambulatory Visit (INDEPENDENT_AMBULATORY_CARE_PROVIDER_SITE_OTHER): Payer: Medicare PPO

## 2022-01-08 VITALS — BP 130/80 | Ht 75.0 in | Wt 191.3 lb

## 2022-01-08 DIAGNOSIS — Z0000 Encounter for general adult medical examination without abnormal findings: Secondary | ICD-10-CM | POA: Diagnosis not present

## 2022-01-08 NOTE — Progress Notes (Signed)
Subjective:   Micheal Mccoy is a 67 y.o. male who presents for Medicare Annual/Subsequent preventive examination.  Review of Systems           Objective:    Today's Vitals   01/08/22 0839  BP: 130/80  Weight: 191 lb 4.8 oz (86.8 kg)  Height: '6\' 3"'$  (1.905 m)   Body mass index is 23.91 kg/m.     12/27/2020   11:05 AM 04/02/2020    5:06 PM 01/31/2020    9:23 AM 12/26/2019    9:13 AM 03/07/2019   12:10 PM 02/17/2019    6:11 AM 01/11/2019   10:22 AM  Advanced Directives  Does Patient Have a Medical Advance Directive? No Yes Yes Yes Yes Yes Yes  Type of Advance Directive    Living will Living will;Healthcare Power of [place];Living will [place];Living will  Does patient want to make changes to medical advance directive?     No - Patient declined No - Patient declined No - Patient declined  Copy of [place] in Chart?     No - copy requested    Would patient like information on creating a medical advance directive? No - Patient declined    No - Patient declined      Current Medications (verified) Outpatient Encounter Medications as of 01/08/2022  Medication Sig   mirabegron ER (MYRBETRIQ) 25 MG TB24 tablet Take 1 tablet (25 mg total) by mouth daily. (Patient not taking: Reported on 11/06/2021)   tadalafil (CIALIS) 20 MG tablet Take 1 tablet (20 mg total) by mouth daily. Or take every other day for ED   No facility-administered encounter medications on file as of 01/08/2022.    Allergies (verified) Aleve [naproxen]   History: Past Medical History:  Diagnosis Date   Arthritis    Benign neoplasm of ascending colon    COVID-19 02/14/2021   Degenerative disc disease, cervical    Esophagitis, reflux 09/29/2005   Frequency of urination    GERD (gastroesophageal reflux disease)    Peyronie's disease    W/ PENILE CURVATURE   Past Surgical History:  Procedure Laterality Date   ANTERIOR CERVICAL DECOMP/DISCECTOMY  FUSION N/A 01/25/2018   Procedure: ANTERIOR CERVICAL DECOMPRESSION/DISCECTOMY FUSION 1 LEVEL C7-T1, T1-T2;  Surgeon: Deetta Perla, MD;  Location: [facility];  Service: Neurosurgery;  Laterality: N/A;   ANTERIOR CERVICAL DISCECTOMY  01/25/2018   Dr. Lacinda Axon, ACDF ARTHRODESIS, OSTEOPHYTECTOMY &DECOMPRESSION OF SPINAL CORD AND/ORNERVE ROOTS; BELOW C2-- C7-T1    COLONOSCOPY WITH PROPOFOL N/A 09/14/2014   Procedure: COLONOSCOPY WITH PROPOFOL;  Surgeon: Lucilla Lame, MD;  Location: [place];  Service: Endoscopy;  Laterality: N/A;   COLONOSCOPY WITH PROPOFOL N/A 01/31/2020   Procedure: COLONOSCOPY WITH PROPOFOL;  Surgeon: Lucilla Lame, MD;  Location: [facility];  Service: Endoscopy;  Laterality: N/A;   HERNIA REPAIR Bilateral 04/09/2015   Bard 3D Max mesh for bilateral inguinal hernias, primary repair of umbilical henria.    HOLEP-LASER ENUCLEATION OF THE PROSTATE WITH MORCELLATION N/A 02/17/2019   Procedure: HOLEP-LASER ENUCLEATION OF THE PROSTATE WITH MORCELLATION;  Surgeon: Billey Co, MD;  Location: [facility];  Service: Urology;  Laterality: N/A;   INGUINAL HERNIA REPAIR Bilateral 04/09/2015   Procedure: LAPAROSCOPIC INGUINAL HERNIA;  Surgeon: Robert Bellow, MD;  Location: [facility];  Service: General;  Laterality: Bilateral;   NESBIT PROCEDURE N/A 06/28/2012   Procedure: 16 DOT PLACTATION PROCEDURE;  Surgeon: Claybon Jabs, MD;  [facility];  Service: Urology;  Laterality: N/A;   Peyronies Disease  06/2012   POLYPECTOMY  09/14/2014   Procedure: POLYPECTOMY;  Surgeon: Lucilla Lame, MD;  Location: [place];  Service: Endoscopy;;   PROSTATE BIOPSY  03-02-15   neg   THORACIC DISCECTOMY  01/25/2018   Dr. Lacinda Axon, ARTHRODESIS ANTERIOR INTERBODY W/DISCECTOMY ADDITIONAL LEVEL S3-M1   UMBILICAL HERNIA REPAIR N/A 04/09/2015   Procedure: HERNIA REPAIR UMBILICAL ADULT;  Surgeon: Robert Bellow, MD;  Location: [facility];  Service: General;  Laterality: N/A;   Family  History  Problem Relation Age of Onset   Prostate cancer Neg Hx    Bladder Cancer Neg Hx    Kidney cancer Neg Hx    Social History   Socioeconomic History   Marital status: Married    Spouse name: Not on file   Number of children: 3   Years of education: PHD   Highest education level: Doctorate  Occupational History   Occupation: Associate Professor    Employer: [employer]    Comment: retired  Tobacco Use   Smoking status: Never   Smokeless tobacco: Never  Scientific laboratory technician Use: Never used  Substance and Sexual Activity   Alcohol use: No   Drug use: No   Sexual activity: Yes  Other Topics Concern   Not on file  Social History Narrative   Not on file   Social Determinants of Health   Financial Resource Strain: Low Risk  (12/27/2020)   Overall Financial Resource Strain (CARDIA)    Difficulty of Paying Living Expenses: Not hard at all  Food Insecurity: No Food Insecurity (12/27/2020)   Hunger Vital Sign    Worried About Running Out of Food in the Last Year: Never true    [place] in the Last Year: Never true  Transportation Needs: No Transportation Needs (12/27/2020)   PRAPARE - Hydrologist (Medical): No    Lack of Transportation (Non-Medical): No  Physical Activity: Sufficiently Active (12/27/2020)   Exercise Vital Sign    Days of Exercise per Week: 4 days    Minutes of Exercise per Session: 50 min  Stress: No Stress Concern Present (12/27/2020)   [place]    Feeling of Stress : Not at all  Social Connections: Moderately Integrated (12/27/2020)   Social Connection and Isolation Panel [NHANES]    Frequency of Communication with Friends and Family: More than three times a week    Frequency of Social Gatherings with Friends and Family: More than three times a week    Attends Religious Services: Never    Marine scientist or Organizations: Yes     Attends Music therapist: More than 4 times per year    Marital Status: Married    Tobacco Counseling Counseling given: Not Answered   Clinical Intake:  Pre-visit preparation completed: Yes  Pain : No/denies pain     Nutritional Risks: None Diabetes: No  How often do you need to have someone help you when you read instructions, pamphlets, or other written materials from your doctor or pharmacy?: 1 - Never  Diabetic?no  Interpreter Needed?: No  Information entered by :: Kirke Shaggy, LPN   Activities of Daily Living    01/06/2022    8:22 AM 11/06/2021   10:27 AM  In your present state of health, do you have any difficulty performing the following activities:  Hearing? 1 1  Vision? 0 0  Difficulty concentrating or making decisions? 0 0  Walking or climbing stairs? 0 0  Dressing or bathing? 0 0  Doing errands, shopping? 0 0  Preparing Food and eating ? N   Using the Toilet? N   In the past six months, have you accidently leaked urine? Y   Do you have problems with loss of bowel control? N   Managing your Medications? N   Managing your Finances? N   Housekeeping or managing your Housekeeping? N     Patient Care Team: [name], MD as PCP - General (Family Medicine) Kate Sable, MD as PCP - Cardiology (Cardiology) Bary Castilla Forest Gleason, MD as Consulting Physician (General Surgery) Deetta Perla, MD as Consulting Physician (Neurosurgery) Billey Co, MD as Consulting Physician (Urology) System, Provider Not In (Optometry) Throat, [place] (Audiology)  Indicate any recent Medical Services you may have received from other than [facility] providers in the past year (date may be approximate).     Assessment:   This is a routine wellness examination for [place].  Hearing/Vision screen No results found.  Dietary issues and exercise activities discussed:     Goals Addressed   None    Depression Screen    11/06/2021   10:27  AM 12/27/2020   11:02 AM 02/24/2020    9:23 AM 12/26/2019    9:09 AM 11/16/2018    9:03 AM 07/28/2017   10:54 AM 12/28/2015   10:25 AM  PHQ 2/9 Scores  PHQ - 2 Score 0 0 0 0 3 3 0  PHQ- 9 Score 0  0  5 5 0    Fall Risk    01/06/2022    8:22 AM 11/06/2021   10:27 AM 12/27/2020   11:06 AM 12/24/2020   11:00 AM 02/24/2020    9:22 AM  Fall Risk   Falls in the past year? 0 0 0 0 0  Number falls in past yr:  0 0  0  Injury with Fall?  0 0  0  Risk for fall due to :  No Fall Risks No Fall Risks    Follow up  Falls evaluation completed Falls evaluation completed      FALL RISK PREVENTION PERTAINING TO THE HOME:  Any stairs in or around the home? No  If so, are there any without handrails? No  Home free of loose throw rugs in walkways, pet beds, electrical cords, etc? Yes  Adequate lighting in your home to reduce risk of falls? Yes   ASSISTIVE DEVICES UTILIZED TO PREVENT FALLS:  Life alert? No  Use of a cane, walker or w/c? No  Grab bars in the bathroom? No  Shower chair or bench in shower? Yes  Elevated toilet seat or a handicapped toilet? No   TIMED UP AND GO:  Was the test performed? Yes .  Length of time to ambulate 10 feet: 4 sec.   Gait steady and fast without use of assistive device  Cognitive Function:        12/26/2019    9:17 AM  6CIT Screen  What Year? 0 points  What month? 0 points  What time? 0 points  Count back from 20 0 points  Months in reverse 0 points  Repeat phrase 0 points  Total Score 0 points    Immunizations Immunization History  Administered Date(s) Administered   COVID-19, mRNA, vaccine(Comirnaty)12 years and older 09/23/2021   Fluad Quad(high Dose 65+)  11/16/2018   Influenza Inj Mdck Quad Pf 12/26/2019   Influenza,inj,Quad PF,6+ Mos 12/28/2015   Influenza-Unspecified 10/24/2020, 09/23/2021   PFIZER(Purple Top)SARS-COV-2 Vaccination 02/26/2019, 03/28/2019, 10/04/2019   PPD Test 07/19/2019   Pneumococcal Conjugate-13 02/24/2020    Pneumococcal Polysaccharide-23 11/16/2018   Tdap 03/22/2013   Zoster Recombinat (Shingrix) 11/16/2018, 02/24/2020    TDAP status: Up to date  Flu Vaccine status: Declined, Education has been provided regarding the importance of this vaccine but patient still declined. Advised may receive this vaccine at local pharmacy or Health Dept. Aware to provide a copy of the vaccination record if obtained from local pharmacy or Health Dept. Verbalized acceptance and understanding.  Pneumococcal vaccine status: Up to date  Covid-19 vaccine status: Completed vaccines  Qualifies for Shingles Vaccine? Yes   Zostavax completed No   Shingrix Completed?: Yes  Screening Tests Health Maintenance  Topic Date Due   Medicare Annual Wellness (AWV)  01/09/2023   DTaP/Tdap/Td (2 - Td or Tdap) 03/23/2023   COLONOSCOPY (Pts 45-19yr Insurance coverage will need to be confirmed)  01/31/2027   Pneumonia Vaccine 67 Years old  Completed   INFLUENZA VACCINE  Completed   COVID-19 Vaccine  Completed   Hepatitis C Screening  Completed   Zoster Vaccines- Shingrix  Completed   HPV VACCINES  Aged Out    Health Maintenance  There are no preventive care reminders to display for this patient.   Colorectal cancer screening: Type of screening: Colonoscopy. Completed 01/31/20. Repeat every 7 years  Lung Cancer Screening: (Low Dose CT Chest recommended if Age 330-80years, 30 pack-year currently smoking OR have quit w/in 15years.) does not qualify.   Additional Screening:  Hepatitis C Screening: does qualify; Completed 11/16/18  Vision Screening: Recommended annual ophthalmology exams for early detection of glaucoma and other disorders of the eye. Is the patient up to date with their annual eye exam?  Yes  Who is the provider or what is the name of the office in which the patient attends annual eye exams? Dr.Ritter If pt is not established with a provider, would they like to be referred to a provider to establish  care? No .   Dental Screening: Recommended annual dental exams for proper oral hygiene  Community Resource Referral / Chronic Care Management: CRR required this visit?  No   CCM required this visit?  No      Plan:     I have personally reviewed and noted the following in the patient's chart:   Medical and social history Use of alcohol, tobacco or illicit drugs  Current medications and supplements including opioid prescriptions. Patient is not currently taking opioid prescriptions. Functional ability and status Nutritional status Physical activity Advanced directives List of other physicians Hospitalizations, surgeries, and ER visits in previous 12 months Vitals Screenings to include cognitive, depression, and falls Referrals and appointments  In addition, I have reviewed and discussed with patient certain preventive protocols, quality metrics, and best practice recommendations. A written personalized care plan for preventive services as well as general preventive health recommendations were provided to patient.     LDionisio David LPN   11/13/6158  Nurse Notes: none

## 2022-01-08 NOTE — Patient Instructions (Signed)
Micheal Mccoy , Thank you for taking time to come for your Medicare Wellness Visit. I appreciate your ongoing commitment to your health goals. Please review the following plan we discussed and let me know if I can assist you in the future.   Screening recommendations/referrals: Colonoscopy: 01/31/20 Recommended yearly ophthalmology/optometry visit for glaucoma screening and checkup Recommended yearly dental visit for hygiene and checkup  Vaccinations: Influenza vaccine: 10/24/20 Pneumococcal vaccine: 02/24/20 Tdap vaccine: 03/22/13 Shingles vaccine: Shingrix 11/16/18, 02/24/20   Covid-19: 02/26/19, 03/28/19, 10/04/19  Advanced directives: no  Conditions/risks identified: none  Next appointment: Follow up in one year for your annual wellness visit. 01/12/23 @ 8:15 am in person  Preventive Care 65 Years and Older, Male Preventive care refers to lifestyle choices and visits with your health care provider that can promote health and wellness. What does preventive care include? A yearly physical exam. This is also called an annual well check. Dental exams once or twice a year. Routine eye exams. Ask your health care provider how often you should have your eyes checked. Personal lifestyle choices, including: Daily care of your teeth and gums. Regular physical activity. Eating a healthy diet. Avoiding tobacco and drug use. Limiting alcohol use. Practicing safe sex. Taking low doses of aspirin every day. Taking vitamin and mineral supplements as recommended by your health care provider. What happens during an annual well check? The services and screenings done by your health care provider during your annual well check will depend on your age, overall health, lifestyle risk factors, and family history of disease. Counseling  Your health care provider may ask you questions about your: Alcohol use. Tobacco use. Drug use. Emotional well-being. Home and relationship well-being. Sexual  activity. Eating habits. History of falls. Memory and ability to understand (cognition). Work and work Statistician. Screening  You may have the following tests or measurements: Height, weight, and BMI. Blood pressure. Lipid and cholesterol levels. These may be checked every 5 years, or more frequently if you are over 59 years old. Skin check. Lung cancer screening. You may have this screening every year starting at age 40 if you have a 30-pack-year history of smoking and currently smoke or have quit within the past 15 years. Fecal occult blood test (FOBT) of the stool. You may have this test every year starting at age 59. Flexible sigmoidoscopy or colonoscopy. You may have a sigmoidoscopy every 5 years or a colonoscopy every 10 years starting at age 1. Prostate cancer screening. Recommendations will vary depending on your family history and other risks. Hepatitis C blood test. Hepatitis B blood test. Sexually transmitted disease (STD) testing. Diabetes screening. This is done by checking your blood sugar (glucose) after you have not eaten for a while (fasting). You may have this done every 1-3 years. Abdominal aortic aneurysm (AAA) screening. You may need this if you are a current or former smoker. Osteoporosis. You may be screened starting at age 12 if you are at high risk. Talk with your health care provider about your test results, treatment options, and if necessary, the need for more tests. Vaccines  Your health care provider may recommend certain vaccines, such as: Influenza vaccine. This is recommended every year. Tetanus, diphtheria, and acellular pertussis (Tdap, Td) vaccine. You may need a Td booster every 10 years. Zoster vaccine. You may need this after age 64. Pneumococcal 13-valent conjugate (PCV13) vaccine. One dose is recommended after age 82. Pneumococcal polysaccharide (PPSV23) vaccine. One dose is recommended after age 48. Talk to your  health care provider about which  screenings and vaccines you need and how often you need them. This information is not intended to replace advice given to you by your health care provider. Make sure you discuss any questions you have with your health care provider. Document Released: 01/19/2015 Document Revised: 09/12/2015 Document Reviewed: 10/24/2014 Elsevier Interactive Patient Education  2017 [place] Prevention in the Home Falls can cause injuries. They can happen to people of all ages. There are many things you can do to make your home safe and to help prevent falls. What can I do on the outside of my home? Regularly fix the edges of walkways and driveways and fix any cracks. Remove anything that might make you trip as you walk through a door, such as a raised step or threshold. Trim any bushes or trees on the path to your home. Use bright outdoor lighting. Clear any walking paths of anything that might make someone trip, such as rocks or tools. Regularly check to see if handrails are loose or broken. Make sure that both sides of any steps have handrails. Any raised decks and porches should have guardrails on the edges. Have any leaves, snow, or ice cleared regularly. Use sand or salt on walking paths during winter. Clean up any spills in your garage right away. This includes oil or grease spills. What can I do in the bathroom? Use night lights. Install grab bars by the toilet and in the tub and shower. Do not use towel bars as grab bars. Use non-skid mats or decals in the tub or shower. If you need to sit down in the shower, use a plastic, non-slip stool. Keep the floor dry. Clean up any water that spills on the floor as soon as it happens. Remove soap buildup in the tub or shower regularly. Attach bath mats securely with double-sided non-slip rug tape. Do not have throw rugs and other things on the floor that can make you trip. What can I do in the bedroom? Use night lights. Make sure that you have a  light by your bed that is easy to reach. Do not use any sheets or blankets that are too big for your bed. They should not hang down onto the floor. Have a firm chair that has side arms. You can use this for support while you get dressed. Do not have throw rugs and other things on the floor that can make you trip. What can I do in the kitchen? Clean up any spills right away. Avoid walking on wet floors. Keep items that you use a lot in easy-to-reach places. If you need to reach something above you, use a strong step stool that has a grab bar. Keep electrical cords out of the way. Do not use floor polish or wax that makes floors slippery. If you must use wax, use non-skid floor wax. Do not have throw rugs and other things on the floor that can make you trip. What can I do with my stairs? Do not leave any items on the stairs. Make sure that there are handrails on both sides of the stairs and use them. Fix handrails that are broken or loose. Make sure that handrails are as long as the stairways. Check any carpeting to make sure that it is firmly attached to the stairs. Fix any carpet that is loose or worn. Avoid having throw rugs at the top or bottom of the stairs. If you do have throw rugs, attach them  to the floor with carpet tape. Make sure that you have a light switch at the top of the stairs and the bottom of the stairs. If you do not have them, ask someone to add them for you. What else can I do to help prevent falls? Wear shoes that: Do not have high heels. Have rubber bottoms. Are comfortable and fit you well. Are closed at the toe. Do not wear sandals. If you use a stepladder: Make sure that it is fully opened. Do not climb a closed stepladder. Make sure that both sides of the stepladder are locked into place. Ask someone to hold it for you, if possible. Clearly mark and make sure that you can see: Any grab bars or handrails. First and last steps. Where the edge of each step  is. Use tools that help you move around (mobility aids) if they are needed. These include: Canes. Walkers. Scooters. Crutches. Turn on the lights when you go into a dark area. Replace any light bulbs as soon as they burn out. Set up your furniture so you have a clear path. Avoid moving your furniture around. If any of your floors are uneven, fix them. If there are any pets around you, be aware of where they are. Review your medicines with your doctor. Some medicines can make you feel dizzy. This can increase your chance of falling. Ask your doctor what other things that you can do to help prevent falls. This information is not intended to replace advice given to you by your health care provider. Make sure you discuss any questions you have with your health care provider. Document Released: 10/19/2008 Document Revised: 05/31/2015 Document Reviewed: 01/27/2014 Elsevier Interactive Patient Education  2017 [employer].

## 2022-01-28 IMAGING — MR MR ABDOMEN WO/W CM
18 series · 48 of 48 positions shown · IV contrast (8ml Gadavist)
Comparison: CT of the abdomen and pelvis dated March 02, 2020.

CLINICAL DATA: A 65-year-old male presents with hematuria and renal
cysts.

EXAM:
MRI ABDOMEN WITHOUT AND WITH CONTRAST
TECHNIQUE: Multiplanar multisequence MR imaging of the abdomen was performed
both before and after the administration of intravenous contrast.
CONTRAST:  8mL GADAVIST GADOBUTROL 1 MMOL/ML IV SOLN

[Series 3: cor haste · coronal · 6.0mm · 1.19mm/px · 2 of 32 slices shown]
[im 1/32]
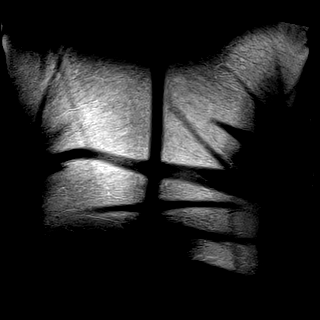
[im 32/32]
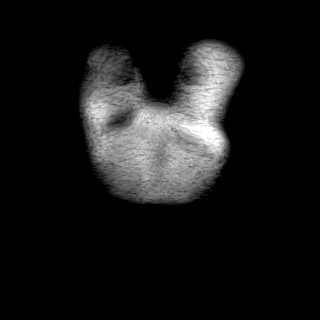

[Series 5: T2 fat-sat · axial · 6.0mm · 1.19mm/px · z∈[-128,+95]mm · 2 of 32 slices shown]
[im 1/32]
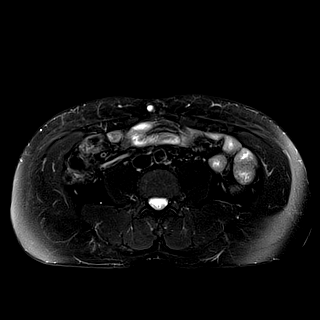
[im 32/32]
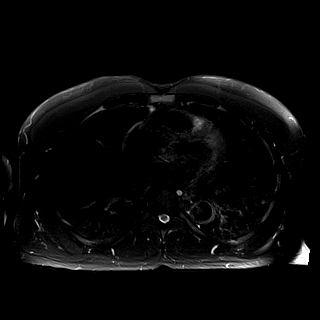

[Series 7: DWI · axial · 6.0mm · 1.42mm/px · z∈[-128,+95]mm · 5 of 96 slices shown (1 of 2)]
[im 1/96]
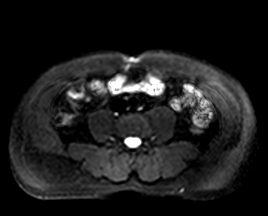
[im 24/96]
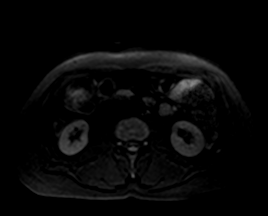
[im 48/96]
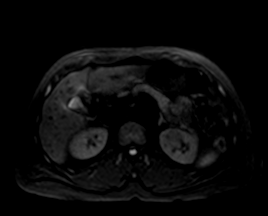
[im 72/96]
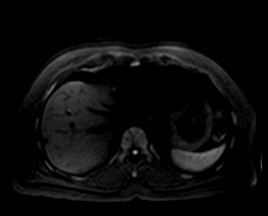
[im 96/96]
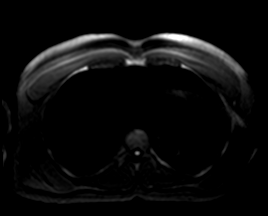

[Series 8: DWI · axial · 6.0mm · 1.42mm/px · 1 of 32 slices shown (2 of 2)]
[im 1/32]
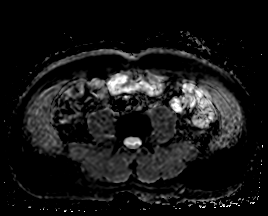

[Series 11: bSSFP · axial · 6.0mm · 0.74mm/px · 1 of 34 slices shown]
[im 1/34]
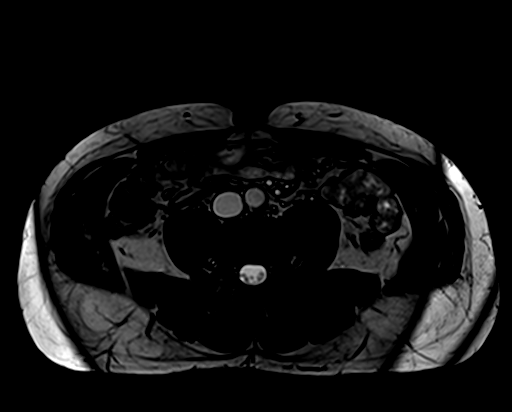

[Series 12: in & out · axial · 3.5mm · 1.19mm/px · z∈[-141,+107]mm · 3 of 72 slices shown (1 of 2)]
[im 1/72]
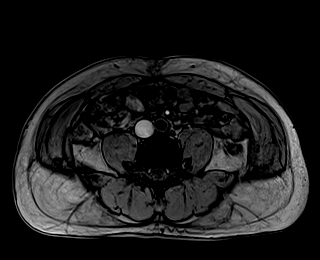
[im 36/72]
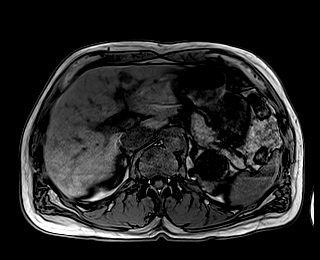
[im 72/72]
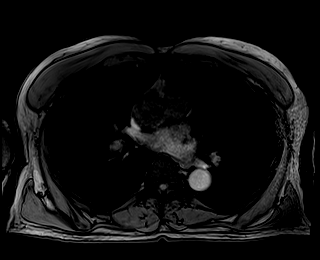

[Series 13: in & out · axial · 3.5mm · 1.19mm/px · z∈[-141,+107]mm · 3 of 72 slices shown (2 of 2)]
[im 1/72]
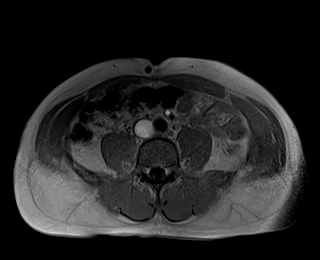
[im 36/72]
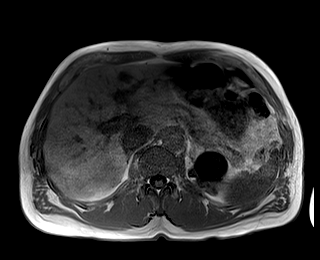
[im 72/72]
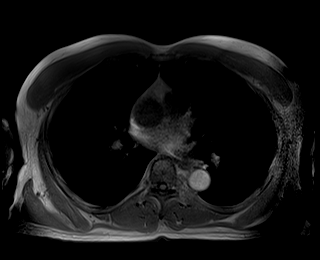

[Series 14: T1 dynamic · axial · non-contrast · 3.5mm · 1.19mm/px · z∈[-141,+107]mm · 3 of 72 slices shown (1 of 9)]
[im 1/72]
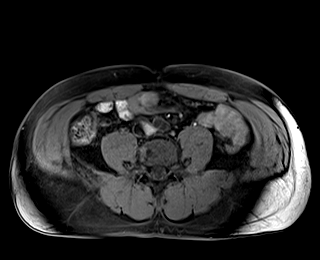
[im 36/72]
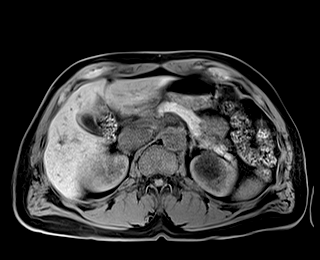
[im 72/72]
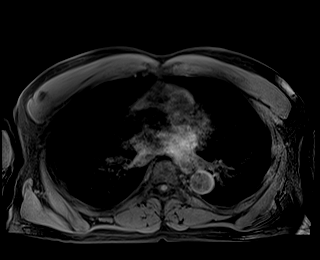

[Series 15: T1 dynamic · axial · 3.5mm · 1.19mm/px · z∈[-141,+107]mm · 3 of 72 slices shown (2 of 9)]
[im 1/72]
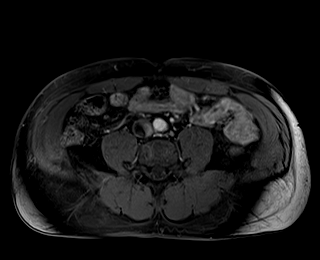
[im 36/72]
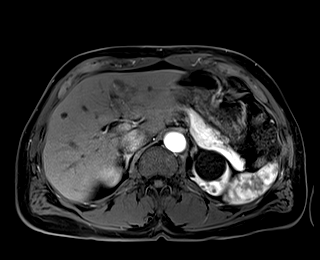
[im 72/72]
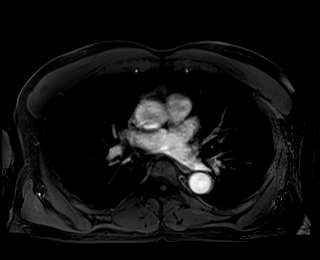

[Series 16: T1 dynamic · axial · 3.5mm · 1.19mm/px · z∈[-141,+107]mm · 3 of 72 slices shown (3 of 9)]
[im 1/72]
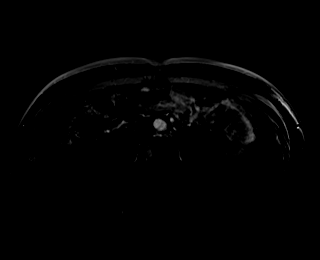
[im 36/72]
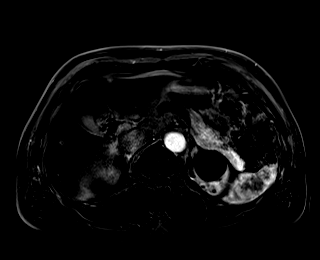
[im 72/72]
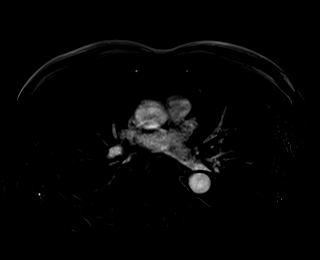

[Series 17: T1 dynamic · axial · 3.5mm · 1.19mm/px · z∈[-141,+107]mm · 3 of 72 slices shown (4 of 9)]
[im 1/72]
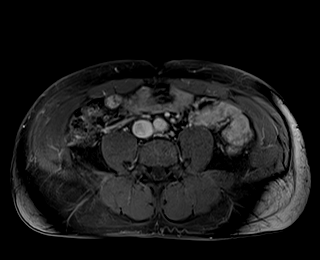
[im 36/72]
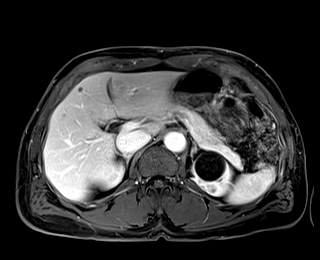
[im 72/72]
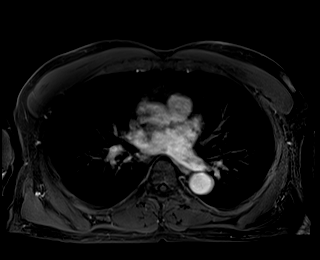

[Series 18: T1 dynamic · axial · 3.5mm · 1.19mm/px · z∈[-141,+107]mm · 3 of 72 slices shown (5 of 9)]
[im 1/72]
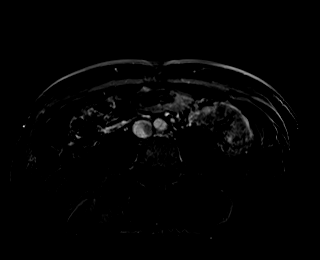
[im 36/72]
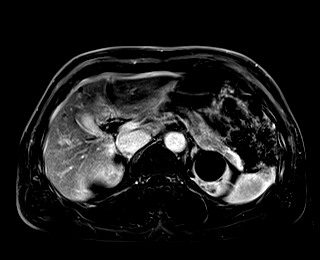
[im 72/72]
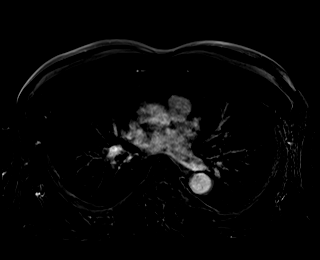

[Series 19: T1 dynamic · axial · 3.5mm · 1.19mm/px · z∈[-141,+107]mm · 3 of 72 slices shown (6 of 9)]
[im 1/72]
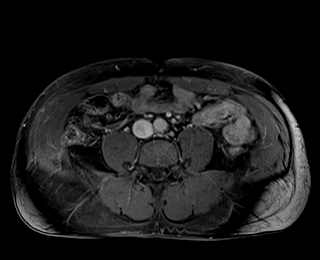
[im 36/72]
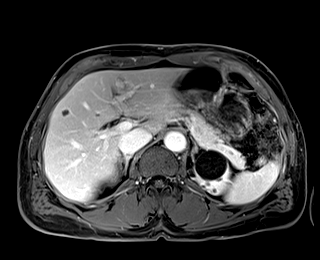
[im 72/72]
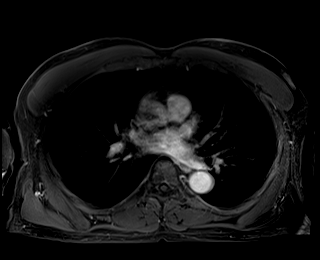

[Series 20: T1 dynamic · axial · 3.5mm · 1.19mm/px · z∈[-141,+107]mm · 3 of 72 slices shown (7 of 9)]
[im 1/72]
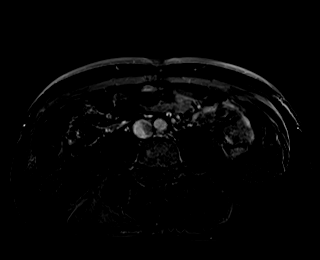
[im 36/72]
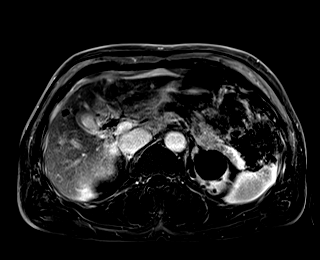
[im 72/72]
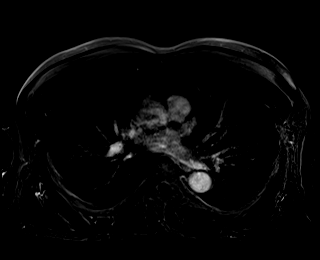

[Series 21: T1 dynamic post-contrast · coronal · 3.0mm · 1.31mm/px · 3 of 80 slices shown]
[im 1/80]
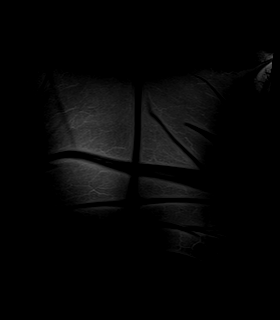
[im 40/80]
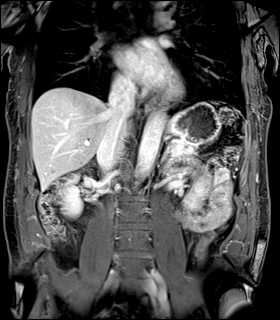
[im 80/80]
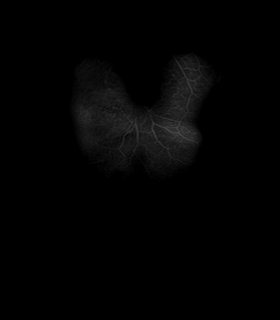

[Series 22: T2 · axial · 6.0mm · 1.19mm/px · 1 of 32 slices shown]
[im 1/32]
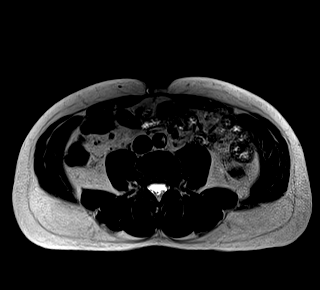

[Series 23: T1 dynamic · axial · 3.5mm · 1.19mm/px · z∈[-141,+107]mm · 3 of 72 slices shown (8 of 9)]
[im 1/72]
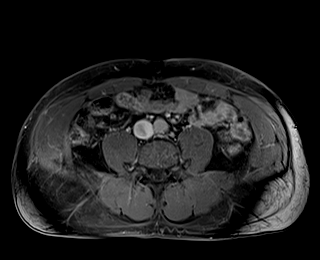
[im 36/72]
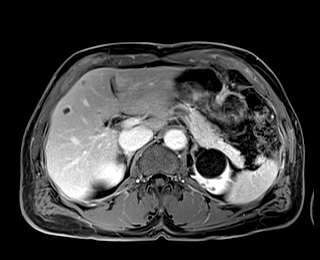
[im 72/72]
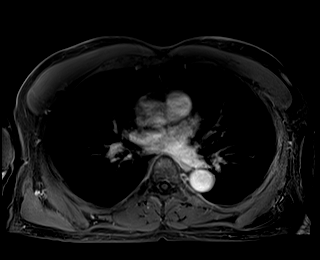

[Series 24: T1 dynamic · axial · 3.5mm · 1.19mm/px · z∈[-141,+107]mm · 3 of 72 slices shown (9 of 9)]
[im 1/72]
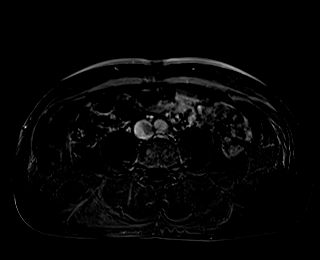
[im 36/72]
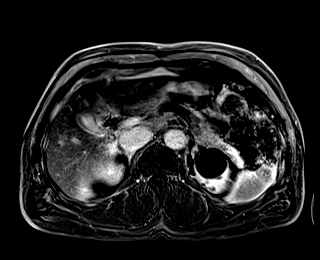
[im 72/72]
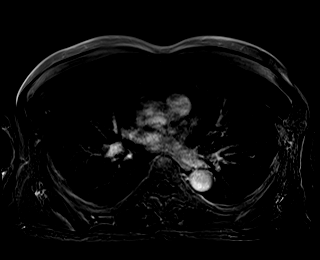

[48 of 48 positions shown; findings below may reference images not displayed]

FINDINGS: Lower chest: No gross effusion or sign of consolidation. Limited
assessment of the lung bases on MRI.

Hepatobiliary: Hepatic cysts and hemangiomata which are unchanged
compared to prior imaging. Hemangioma is present in the medial
segment of the LEFT hepatic lobe measuring 2 cm (image 34/19) liver
contour is smooth. The portal vein is patent. No pericholecystic
stranding or sign of biliary duct distension.

Pancreas: Normal intrinsic T1 signal. No ductal dilation or sign of
inflammation. No focal lesion.

Spleen:  Normal spleen.

Adrenals/Urinary Tract:  Adrenal glands are normal.

Cysts in the bilateral kidneys largest in the upper pole measuring
approximately 3.6 cm with no signs of internal enhancement or
septation. Adjacent proteinaceous or hemorrhagic cyst (image 31/14)
13 mm. Smaller cysts adjacent to this area as well in the posterior
upper pole the LEFT kidney. Small cysts on the RIGHT none with
suspicious features. No hydronephrosis. No perinephric stranding.

Stomach/Bowel: No acute gastrointestinal process to the extent
evaluated on abdominal MRI.

Vascular/Lymphatic: No pathologically enlarged lymph nodes
identified. No abdominal aortic aneurysm demonstrated.

Other:  No ascites.

Musculoskeletal: No suspicious bone lesions identified.
IMPRESSION: Cysts in the bilateral kidneys, largest in the upper pole of the
LEFT kidney measuring 3.6 cm. No suspicious features. Classified as
Bosniak category I and II.

Hepatic cysts and hemangiomata which are unchanged compared to prior
imaging.

## 2022-05-21 DIAGNOSIS — H524 Presbyopia: Secondary | ICD-10-CM | POA: Diagnosis not present

## 2022-07-14 ENCOUNTER — Other Ambulatory Visit: Payer: Self-pay | Admitting: Urology

## 2022-07-14 DIAGNOSIS — N5239 Other post-surgical erectile dysfunction: Secondary | ICD-10-CM

## 2023-02-03 ENCOUNTER — Ambulatory Visit: Payer: Medicare PPO

## 2023-02-03 VITALS — BP 138/82 | Ht 74.0 in | Wt 195.6 lb

## 2023-02-03 DIAGNOSIS — Z0000 Encounter for general adult medical examination without abnormal findings: Secondary | ICD-10-CM

## 2023-02-03 NOTE — Patient Instructions (Addendum)
Micheal Mccoy , Thank you for taking time to come for your Medicare Wellness Visit. I appreciate your ongoing commitment to your health goals. Please review the following plan we discussed and let me know if I can assist you in the future.   Referrals/Orders/Follow-Ups/Clinician Recommendations: NONE  This is a list of the screening recommended for you and due dates:  Health Maintenance  Topic Date Due   COVID-19 Vaccine (5 - 2024-25 season) 09/07/2022   DTaP/Tdap/Td vaccine (2 - Td or Tdap) 03/23/2023   Medicare Annual Wellness Visit  02/03/2024   Colon Cancer Screening  01/31/2027   Pneumonia Vaccine  Completed   Flu Shot  Completed   Hepatitis C Screening  Completed   Zoster (Shingles) Vaccine  Completed   HPV Vaccine  Aged Out    Advanced directives: (ACP Link)Information on Advanced Care Planning can be found at [facility] of [place] Advance Health Care Directives Advance Health Care Directives ([email or web address]   Next Medicare Annual Wellness Visit scheduled for next year: Yes   02/09/24 @ 8:10 AM IN PERSON

## 2023-02-03 NOTE — Progress Notes (Signed)
Subjective:   Micheal Mccoy is a 68 y.o. male who presents for Medicare Annual/Subsequent preventive examination.  Visit Complete: In person  Cardiac Risk Factors include: advanced age (>31men, >49 women);male gender     Objective:    Today's Vitals   02/03/23 0806  BP: 138/82  Weight: 195 lb 9.6 oz (88.7 kg)  Height: 6\' 2"  (1.88 m)   Body mass index is 25.11 kg/m.     02/03/2023    8:11 AM 01/08/2022    8:48 AM 12/27/2020   11:05 AM 04/02/2020    5:06 PM 01/31/2020    9:23 AM 12/26/2019    9:13 AM 03/07/2019   12:10 PM  Advanced Directives  Does Patient Have a Medical Advance Directive? No No No Yes Yes Yes Yes  Type of Advance Directive      Living will Living will;Healthcare Power of Attorney  Does patient want to make changes to medical advance directive?       No - Patient declined  Copy of Healthcare Power of Attorney in Chart?       No - copy requested  Would patient like information on creating a medical advance directive? No - Patient declined No - Patient declined No - Patient declined    No - Patient declined    Current Medications (verified) Outpatient Encounter Medications as of 02/03/2023  Medication Sig   tadalafil (CIALIS) 20 MG tablet Take 1 tablet (20 mg total) by mouth daily. Or take every other day for ED   mirabegron ER (MYRBETRIQ) 25 MG TB24 tablet Take 1 tablet (25 mg total) by mouth daily. (Patient not taking: Reported on 02/03/2023)   No facility-administered encounter medications on file as of 02/03/2023.    Allergies (verified) Aleve [naproxen]   History: Past Medical History:  Diagnosis Date   Arthritis    Benign neoplasm of ascending colon    COVID-19 02/14/2021   Degenerative disc disease, cervical    Esophagitis, reflux 09/29/2005   Frequency of urination    GERD (gastroesophageal reflux disease)    Peyronie's disease    W/ PENILE CURVATURE   Past Surgical History:  Procedure Laterality Date   ANTERIOR CERVICAL DECOMP/DISCECTOMY  FUSION N/A 01/25/2018   Procedure: ANTERIOR CERVICAL DECOMPRESSION/DISCECTOMY FUSION 1 LEVEL C7-T1, T1-T2;  Surgeon: Lucy Chris, MD;  Location: [facility];  Service: Neurosurgery;  Laterality: N/A;   ANTERIOR CERVICAL DISCECTOMY  01/25/2018   Dr. Adriana Simas, ACDF ARTHRODESIS, OSTEOPHYTECTOMY &DECOMPRESSION OF SPINAL CORD AND/ORNERVE ROOTS; BELOW C2-- C7-T1    COLONOSCOPY WITH PROPOFOL N/A 09/14/2014   Procedure: COLONOSCOPY WITH PROPOFOL;  Surgeon: Midge Minium, MD;  Location: [facility];  Service: Endoscopy;  Laterality: N/A;   COLONOSCOPY WITH PROPOFOL N/A 01/31/2020   Procedure: COLONOSCOPY WITH PROPOFOL;  Surgeon: Midge Minium, MD;  Location: [facility];  Service: Endoscopy;  Laterality: N/A;   HERNIA REPAIR Bilateral 04/09/2015   Bard 3D Max mesh for bilateral inguinal hernias, primary repair of umbilical henria.    HOLEP-LASER ENUCLEATION OF THE PROSTATE WITH MORCELLATION N/A 02/17/2019   Procedure: HOLEP-LASER ENUCLEATION OF THE PROSTATE WITH MORCELLATION;  Surgeon: Sondra Come, MD;  Location: [facility];  Service: Urology;  Laterality: N/A;   INGUINAL HERNIA REPAIR Bilateral 04/09/2015   Procedure: LAPAROSCOPIC INGUINAL HERNIA;  Surgeon: Earline Mayotte, MD;  Location: [facility];  Service: General;  Laterality: Bilateral;   NESBIT PROCEDURE N/A 06/28/2012   Procedure: 16 DOT PLACTATION PROCEDURE;  Surgeon: Garnett Farm, MD;  Location: [facility];  Service: Urology;  Laterality: N/A;   Peyronies Disease  06/2012   POLYPECTOMY  09/14/2014   Procedure: POLYPECTOMY;  Surgeon: Midge Minium, MD;  Location: [facility];  Service: Endoscopy;;   PROSTATE BIOPSY  03-02-15   neg   THORACIC DISCECTOMY  01/25/2018   Dr. Adriana Simas, ARTHRODESIS ANTERIOR INTERBODY W/DISCECTOMY ADDITIONAL LEVEL T1-T2   UMBILICAL HERNIA REPAIR N/A 04/09/2015   Procedure: HERNIA REPAIR UMBILICAL ADULT;  Surgeon: Earline Mayotte, MD;  Location: [facility];  Service: General;  Laterality: N/A;   Family  History  Problem Relation Age of Onset   Prostate cancer Neg Hx    Bladder Cancer Neg Hx    Kidney cancer Neg Hx    Social History   Socioeconomic History   Marital status: Married    Spouse name: Not on file   Number of children: 3   Years of education: PHD   Highest education level: Doctorate  Occupational History   Occupation: Associate Professor    Employer: [employer]    Comment: retired  Tobacco Use   Smoking status: Never   Smokeless tobacco: Never  Vaping Use   Vaping status: Never Used  Substance and Sexual Activity   Alcohol use: No   Drug use: No   Sexual activity: Yes  Other Topics Concern   Not on file  Social History Narrative   Not on file   Social Drivers of Health   Financial Resource Strain: Low Risk  (02/03/2023)   Overall Financial Resource Strain (CARDIA)    Difficulty of Paying Living Expenses: Not very hard  Food Insecurity: No Food Insecurity (02/03/2023)   Hunger Vital Sign    Worried About Running Out of Food in the Last Year: Never true    Ran Out of Food in the Last Year: Never true  Transportation Needs: No Transportation Needs (02/03/2023)   PRAPARE - Administrator, Civil Service (Medical): No    Lack of Transportation (Non-Medical): No  Physical Activity: Sufficiently Active (02/03/2023)   Exercise Vital Sign    Days of Exercise per Week: 5 days    Minutes of Exercise per Session: 60 min  Stress: No Stress Concern Present (02/03/2023)   [employer] of Occupational Health - Occupational Stress Questionnaire    Feeling of Stress : Not at all  Social Connections: Socially Integrated (02/03/2023)   Social Connection and Isolation Panel [NHANES]    Frequency of Communication with Friends and Family: More than three times a week    Frequency of Social Gatherings with Friends and Family: More than three times a week    Attends Religious Services: More than 4 times per year    Active Member of [employer] or Organizations: Yes     Attends Engineer, structural: More than 4 times per year    Marital Status: Married    Tobacco Counseling Counseling given: Not Answered   Clinical Intake:  Pre-visit preparation completed: Yes  Pain : No/denies pain     BMI - recorded: 25.11 Nutritional Status: BMI 25 -29 Overweight Nutritional Risks: None Diabetes: No  How often do you need to have someone help you when you read instructions, pamphlets, or other written materials from your doctor or pharmacy?: 1 - Never  Interpreter Needed?: No  Information entered by :: Kennedy Bucker, LPN   Activities of Daily Living    02/03/2023    8:12 AM  In your present state of health, do you have any  difficulty performing the following activities:  Hearing? 1  Vision? 0  Difficulty concentrating or making decisions? 0  Walking or climbing stairs? 0  Dressing or bathing? 0  Doing errands, shopping? 0  Preparing Food and eating ? N  Using the Toilet? N  In the past six months, have you accidently leaked urine? N  Do you have problems with loss of bowel control? N  Managing your Medications? N  Managing your Finances? N  Housekeeping or managing your Housekeeping? N    Patient Care Team: Malva Limes, MD as PCP - General (Family Medicine) Debbe Odea, MD as PCP - Cardiology (Cardiology) Lemar Livings Merrily Pew, MD as Consulting Physician (General Surgery) Lucy Chris, MD as Consulting Physician (Neurosurgery) Sondra Come, MD as Consulting Physician (Urology) System, Provider Not In (Optometry) Throat, [place] Ear Nose And (Audiology)  Indicate any recent Medical Services you may have received from other than [facility] providers in the past year (date may be approximate).     Assessment:   This is a routine wellness examination for [place].  Hearing/Vision screen Hearing Screening - Comments:: WEARS AIDS, BOTH EARS Vision Screening - Comments:: [facility]ERS- [facility]   Goals Addressed              This Visit's Progress    Cut out extra servings         Depression Screen    02/03/2023    8:09 AM 01/08/2022    8:42 AM 11/06/2021   10:27 AM 12/27/2020   11:02 AM 02/24/2020    9:23 AM 12/26/2019    9:09 AM 11/16/2018    9:03 AM  PHQ 2/9 Scores  PHQ - 2 Score 0 0 0 0 0 0 3  PHQ- 9 Score 0 0 0  0  5    Fall Risk    02/03/2023    8:12 AM 01/08/2022    8:49 AM 01/06/2022    8:22 AM 11/06/2021   10:27 AM 12/27/2020   11:06 AM  Fall Risk   Falls in the past year? 0 0 0 0 0  Number falls in past yr: 0 0  0 0  Injury with Fall? 0 0  0 0  Risk for fall due to : No Fall Risks No Fall Risks  No Fall Risks No Fall Risks  Follow up Falls prevention discussed;Falls evaluation completed Falls prevention discussed;Falls evaluation completed  Falls evaluation completed Falls evaluation completed    MEDICARE RISK AT HOME: Medicare Risk at Home Any stairs in or around the home?: No If so, are there any without handrails?: No Home free of loose throw rugs in walkways, pet beds, electrical cords, etc?: Yes Adequate lighting in your home to reduce risk of falls?: Yes Life alert?: No Use of a cane, walker or w/c?: No Grab bars in the bathroom?: No Shower chair or bench in shower?: No Elevated toilet seat or a handicapped toilet?: Yes  TIMED UP AND GO:  Was the test performed?  Yes  Length of time to ambulate 10 feet: 4 sec Gait steady and fast without use of assistive device    Cognitive Function:        02/03/2023    8:13 AM 12/26/2019    9:17 AM  6CIT Screen  What Year? 0 points 0 points  What month? 0 points 0 points  What time? 0 points 0 points  Count back from 20 0 points 0 points  Months in reverse 0 points 0 points  Repeat  phrase 0 points 0 points  Total Score 0 points 0 points    Immunizations Immunization History  Administered Date(s) Administered   Fluad Quad(high Dose 65+) 11/16/2018   Influenza Inj Mdck Quad Pf 12/26/2019   Influenza,inj,Quad PF,6+ Mos  12/28/2015   Influenza-Unspecified 10/24/2020, 09/23/2021, 10/06/2022   PFIZER(Purple Top)SARS-COV-2 Vaccination 02/26/2019, 03/28/2019, 10/04/2019   PPD Test 07/19/2019   Pfizer(Comirnaty)Fall Seasonal Vaccine 12 years and older 09/23/2021   Pneumococcal Conjugate-13 02/24/2020   Pneumococcal Polysaccharide-23 11/16/2018   Tdap 03/22/2013   Zoster Recombinant(Shingrix) 11/16/2018, 02/24/2020    TDAP status: Up to date  Flu Vaccine status: Up to date  Pneumococcal vaccine status: Up to date  Covid-19 vaccine status: Completed vaccines  Qualifies for Shingles Vaccine? Yes   Zostavax completed No   Shingrix Completed?: Yes  Screening Tests Health Maintenance  Topic Date Due   COVID-19 Vaccine (5 - 2024-25 season) 09/07/2022   DTaP/Tdap/Td (2 - Td or Tdap) 03/23/2023   Medicare Annual Wellness (AWV)  02/03/2024   Colonoscopy  01/31/2027   Pneumonia Vaccine 54+ Years old  Completed   INFLUENZA VACCINE  Completed   Hepatitis C Screening  Completed   Zoster Vaccines- Shingrix  Completed   HPV VACCINES  Aged Out    Health Maintenance  Health Maintenance Due  Topic Date Due   COVID-19 Vaccine (5 - 2024-25 season) 09/07/2022    Colorectal cancer screening: Type of screening: Colonoscopy. Completed 01/31/20. Repeat every 7 years  Lung Cancer Screening: (Low Dose CT Chest recommended if Age 20-80 years, 20 pack-year currently smoking OR have quit w/in 15years.) does not qualify.   Additional Screening:  Hepatitis C Screening: does qualify; Completed 11/16/18  Vision Screening: Recommended annual ophthalmology exams for early detection of glaucoma and other disorders of the eye. Is the patient up to date with their annual eye exam?  Yes  Who is the provider or what is the name of the office in which the patient attends annual eye exams? [facility] If pt is not established with a provider, would they like to be referred to a provider to establish care? No .   Dental  Screening: Recommended annual dental exams for proper oral hygiene   Community Resource Referral / Chronic Care Management: CRR required this visit?  No   CCM required this visit?  No     Plan:     I have personally reviewed and noted the following in the patient's chart:   Medical and social history Use of alcohol, tobacco or illicit drugs  Current medications and supplements including opioid prescriptions. Patient is not currently taking opioid prescriptions. Functional ability and status Nutritional status Physical activity Advanced directives List of other physicians Hospitalizations, surgeries, and ER visits in previous 12 months Vitals Screenings to include cognitive, depression, and falls Referrals and appointments  In addition, I have reviewed and discussed with patient certain preventive protocols, quality metrics, and best practice recommendations. A written personalized care plan for preventive services as well as general preventive health recommendations were provided to patient.     Hal Hope, LPN   1/61/0960   After Visit Summary: (In Person-Declined) Patient declined AVS at this time.  Nurse Notes: NONE

## 2023-02-04 ENCOUNTER — Other Ambulatory Visit: Payer: Self-pay | Admitting: Family Medicine

## 2023-02-04 DIAGNOSIS — R350 Frequency of micturition: Secondary | ICD-10-CM

## 2023-02-04 DIAGNOSIS — N400 Enlarged prostate without lower urinary tract symptoms: Secondary | ICD-10-CM

## 2023-02-04 DIAGNOSIS — N529 Male erectile dysfunction, unspecified: Secondary | ICD-10-CM

## 2023-03-06 ENCOUNTER — Other Ambulatory Visit: Payer: Self-pay

## 2023-03-06 DIAGNOSIS — R350 Frequency of micturition: Secondary | ICD-10-CM

## 2023-03-06 DIAGNOSIS — N138 Other obstructive and reflux uropathy: Secondary | ICD-10-CM

## 2023-03-06 DIAGNOSIS — N393 Stress incontinence (female) (male): Secondary | ICD-10-CM

## 2023-03-11 ENCOUNTER — Other Ambulatory Visit
Admission: RE | Admit: 2023-03-11 | Discharge: 2023-03-11 | Disposition: A | Discharge status: Home / Self Care | Attending: Urology | Admitting: Urology

## 2023-03-11 ENCOUNTER — Encounter: Payer: Self-pay | Admitting: Urology

## 2023-03-11 ENCOUNTER — Ambulatory Visit: Payer: Medicare PPO | Admitting: Urology

## 2023-03-11 VITALS — BP 150/93 | Ht 74.0 in | Wt 190.0 lb

## 2023-03-11 DIAGNOSIS — N138 Other obstructive and reflux uropathy: Secondary | ICD-10-CM

## 2023-03-11 DIAGNOSIS — R350 Frequency of micturition: Secondary | ICD-10-CM | POA: Insufficient documentation

## 2023-03-11 DIAGNOSIS — N5239 Other post-surgical erectile dysfunction: Secondary | ICD-10-CM

## 2023-03-11 DIAGNOSIS — N529 Male erectile dysfunction, unspecified: Secondary | ICD-10-CM

## 2023-03-11 DIAGNOSIS — N401 Enlarged prostate with lower urinary tract symptoms: Secondary | ICD-10-CM | POA: Insufficient documentation

## 2023-03-11 DIAGNOSIS — R399 Unspecified symptoms and signs involving the genitourinary system: Secondary | ICD-10-CM

## 2023-03-11 DIAGNOSIS — N393 Stress incontinence (female) (male): Secondary | ICD-10-CM | POA: Diagnosis not present

## 2023-03-11 LAB — URINALYSIS, COMPLETE (UACMP) WITH MICROSCOPIC
Bilirubin Urine: NEGATIVE
Glucose, UA: NEGATIVE mg/dL
Ketones, ur: 15 mg/dL — AB
Leukocytes,Ua: NEGATIVE
Nitrite: NEGATIVE
Protein, ur: NEGATIVE mg/dL
Specific Gravity, Urine: 1.02 (ref 1.005–1.030)
Squamous Epithelial / HPF: NONE SEEN /HPF (ref 0–5)
WBC, UA: NONE SEEN WBC/hpf (ref 0–5)
pH: 6.5 (ref 5.0–8.0)

## 2023-03-11 LAB — BLADDER SCAN AMB NON-IMAGING: Scan Result: 0

## 2023-03-11 MED ORDER — OXYBUTYNIN CHLORIDE ER 10 MG PO TB24: 10.0000 mg | ORAL_TABLET | Freq: Every day | ORAL | 11 refills | Status: DC

## 2023-03-11 MED ORDER — TADALAFIL 20 MG PO TABS: 20.0000 mg | ORAL_TABLET | Freq: Every day | ORAL | 3 refills | Status: DC

## 2023-03-11 NOTE — Patient Instructions (Signed)

## 2023-03-11 NOTE — Progress Notes (Signed)
   03/11/2023 9:54 AM   Chandra Batch March 13, 1955 [id number]  Reason for visit: Follow up ED, urinary symptoms and history of BPH status post HOLEP, history of Peyronie's disease  HPI: 68 year old male who underwent a HOLEP in February 2021 for BPH symptoms of weak stream, straining, sensation of incomplete emptying, nocturia.  87 g of benign tissue were removed, at our last visit in December 2021 he was doing well with an IPSS score of 5 and PVR of 0.  He was having minimal leakage with heavy weightlifting.  He had appropriate decrease in his PSA from 7 preop, down to 1.2 which has been stable, most recently in September 2023.  He also has a history of peyronies disease and underwent a plication in 2014 with Dr. Vernie Ammons.  Has had long-term problems with ED and previously has tried sildenafil, Levitra, and most recently Cialis.  Testosterone was last checked December 2021 and was normal at 878.  He also has a long history of low-grade asymptomatic microscopic hematuria with negative workup including normal CT urogram and cystoscopy 2022.  He reports worsening urinary symptoms over the last few months with urgency and frequency during the day, as well as need to urinate when he has an erection.  He does fine overnight with nocturia 0-1x.  IPSS score today is 12, with quality-of-life unhappy.  Urinalysis today benign aside from his chronic low-grade microscopic hematuria, PVR normal at 0ml.  It sounds like he consumes a fair amount of fluids during the day including water, orange juice, and soda.  We discussed avoiding bladder irritants and potentially decreasing his fluid intake during the day.  He was trialed on Myrbetriq 25 mg by Michiel Cowboy for 1 month, and he is not sure if this was helpful.  Interesting presentation with worsening urinary symptoms for years after HOLEP, prior cystoscopy in 2022 for microscopic hematuria was benign with open channel, primary symptoms are urgency and frequency  during the day with really no problems overnight.  We reviewed possible etiologies including high-volume fluid intake/bladder irritants, OAB, regrowth of prostate adenoma, stricture/bladder neck contracture.  Recommended voiding diary and decrease fluid intake during the day, avoid bladder irritants, trial of oxybutynin 10 mg XL daily, RTC for cystoscopy in 6 weeks-can cancel if symptoms have resolved Continue Cialis 20 mg for ED, refilled  Sondra Come, MD  [facility] [address], [address] [place], [place] [address] [phone]

## 2023-04-14 ENCOUNTER — Telehealth: Admitting: Physician Assistant

## 2023-04-14 DIAGNOSIS — L539 Erythematous condition, unspecified: Secondary | ICD-10-CM

## 2023-04-14 DIAGNOSIS — M25472 Effusion, left ankle: Secondary | ICD-10-CM

## 2023-04-14 DIAGNOSIS — W57XXXA Bitten or stung by nonvenomous insect and other nonvenomous arthropods, initial encounter: Secondary | ICD-10-CM | POA: Diagnosis not present

## 2023-04-14 MED ORDER — CEPHALEXIN 500 MG PO CAPS: 500.0000 mg | ORAL_CAPSULE | Freq: Four times a day (QID) | ORAL | 0 refills | Status: DC

## 2023-04-14 MED ORDER — PREDNISONE 10 MG (21) PO TBPK
ORAL_TABLET | ORAL | 0 refills | Status: DC
Start: 2023-04-14 — End: 2023-10-07

## 2023-04-14 NOTE — Patient Instructions (Signed)
 Chandra Batch, thank you for joining Margaretann Loveless, PA-C for today's virtual visit.  While this provider is not your primary care provider (PCP), if your PCP is located in our provider database this encounter information will be shared with them immediately following your visit.   A [place] MyChart account gives you access to today's visit and all your visits, tests, and labs performed at [facility] " click here if you don't have a [place] MyChart account or go to [email or web address]  Consent: (Patient) RADLEY BARTO provided verbal consent for this virtual visit at the beginning of the encounter.  Current Medications:  Current Outpatient Medications:    cephALEXin (KEFLEX) 500 MG capsule, Take 1 capsule (500 mg total) by mouth 4 (four) times daily., Disp: 20 capsule, Rfl: 0   predniSONE (STERAPRED UNI-PAK 21 TAB) 10 MG (21) TBPK tablet, 6 day taper; take as directed on package instructions, Disp: 21 tablet, Rfl: 0   oxybutynin (DITROPAN-XL) 10 MG 24 hr tablet, Take 1 tablet (10 mg total) by mouth daily., Disp: 30 tablet, Rfl: 11   tadalafil (CIALIS) 20 MG tablet, Take 1 tablet (20 mg total) by mouth daily. Or take every other day for ED, Disp: 90 tablet, Rfl: 3   Medications ordered in this encounter:  Meds ordered this encounter  Medications   cephALEXin (KEFLEX) 500 MG capsule    Sig: Take 1 capsule (500 mg total) by mouth 4 (four) times daily.    Dispense:  20 capsule    Refill:  0    Supervising Provider:   Merrilee Jansky [[id number]]   predniSONE (STERAPRED UNI-PAK 21 TAB) 10 MG (21) TBPK tablet    Sig: 6 day taper; take as directed on package instructions    Dispense:  21 tablet    Refill:  0    Supervising Provider:   Merrilee Jansky [[id number]]     *If you need refills on other medications prior to your next appointment, please contact your pharmacy*  Follow-Up: Call back or seek an in-person evaluation if the symptoms worsen or if the  condition fails to improve as anticipated.  [facility] [phone]  Other Instructions  Insect Bite, Adult An insect bite can make your skin red, itchy, and swollen. An insect bite is different from an insect sting, which happens when an insect injects poison (venom) into the skin. Some insects can spread disease to people through a bite. However, most insect bites do not lead to disease and are not serious. What are the causes? Insects may bite for a variety of reasons, including: Hunger. To defend themselves. Insects that bite include: Spiders. Mosquitoes and flies. Ticks and fleas. Ants. Kissing bugs. Chiggers. What are the signs or symptoms? In many cases, symptoms last for 2-4 days. However, itching can last up to 10 days. Symptoms include: Itching or pain in the bite area. Redness and swelling in the bite area. An open wound (skin ulcer). In rare cases, a person may have a severe allergic reaction (anaphylactic reaction) to a bite. Symptoms of an anaphylactic reaction may include: Feeling warm in the face (flushed). This may include redness. Itchy, red, swollen areas of skin (hives). Swelling of the eyes, lips, face, mouth, tongue, or throat. Wheezing or difficulty breathing, speaking, or swallowing. Dizziness, light-headedness, or fainting. Abdominal symptoms like cramping, nausea, vomiting, or diarrhea. How is this diagnosed? This condition is usually diagnosed based on symptoms and a physical exam. During the exam, your  health care provider will look at the bite and ask you what kind of insect bit you. How is this treated? Most insect bites are not serious. Symptoms often go away on their own and treatment is not usually needed. When treatment is recommended, it may include: Applying ice to the affected area. Applying steroid or other anti-itch creams, like calamine lotion, to the bite area. Medicines called antihistamines to reduce itching. You may  also need: A tetanus shot if you are not up to date. Antibiotic cream or an oral antibiotic if the bite becomes infected (this is uncommon). Follow these instructions at home: Bite area care  Do not scratch the bite area. It may help to cover the bite area with a bandage or close-fitting clothing. Keep the bite area clean and dry. Wash it every day with soap and water as told by your health care provider. Check the bite area every day for signs of infection. Check for: More redness, swelling, or pain. Fluid or blood. Warmth. Pus or a bad smell. Managing pain, itching, and swelling  You may apply cortisone cream, calamine lotion, or a paste made of baking soda and water to the bite area as told by your health care provider. If directed, put ice on the bite area. To do this: Put ice in a plastic bag. Place a towel between your skin and the bag. Leave the ice on for 20 minutes, 2-3 times a day. If your skin turns bright red, remove the ice right away to prevent skin damage. The risk of skin damage is higher if you cannot feel pain, heat, or cold. General instructions Apply or take over-the-counter and prescription medicine only as told by your health care provider. If you were prescribed antibiotics, take or apply them as told by your health care provider. Do not stop using the antibiotic even if you start to feel better. How is this prevented? To help reduce your risk of insect bites: When you are outdoors, wear clothing that covers your arms and legs. This is especially important in the early morning and evening. Use insect repellent. The best insect repellents contain DEET, picaridin, oil of lemon eucalyptus (OLE), or IR3535. Consider spraying your clothing with a pesticide called permethrin. Permethrin helps prevent insect bites. It works for several weeks and for up to 5-6 clothing washes. Do not apply permethrin directly to the skin. If your home windows do not have screens, consider  installing them. If you will be sleeping in an area where there are mosquitoes, consider covering your sleeping area with a mosquito net. Contact a health care provider if: Your bite area has signs of infection, such as: More redness, swelling, or pain. Fluid or blood. Warmth. Pus or a bad smell. You have a fever. Get help right away if: You have a rash. You have muscle or joint pain. You feel unusually tired or weak. You have neck pain or a headache. You develop symptoms of an anaphylactic reaction. These may include: Swelling of the eyes, lips, face, mouth, tongue, or throat. Flushed skin or hives. Wheezing. Difficulty breathing, speaking, or swallowing. Dizziness, light-headedness, or fainting. Abdominal pain, cramping, vomiting, or diarrhea. These symptoms may be an emergency. Get help right away. Call 911. Do not wait to see if the symptoms will go away. Do not drive yourself to the hospital. Summary An insect bite can make your skin red, itchy, and swollen. Treatment is usually not needed. Symptoms often go away on their own. When treatment is  recommended, it may involve taking medicine, applying medicine to the area, or applying ice. Apply or take over-the-counter and prescription medicines only as told by your health care provider. Use insect repellent to help prevent insect bites. Contact a health care provider if your bite area has signs of infection. This information is not intended to replace advice given to you by your health care provider. Make sure you discuss any questions you have with your health care provider. Document Revised: 04/03/2021 Document Reviewed: 03/19/2021 Elsevier Patient Education  2024 Elsevier Inc.   If you have been instructed to have an in-person evaluation today at a local Urgent Care facility, please use the link below. It will take you to a list of all of our available [place] [facility], including address, phone number and hours of  operation. Please do not delay care.  [facility]  If you or a family member do not have a primary care provider, use the link below to schedule a visit and establish care. When you choose a [place] primary care physician or advanced practice provider, you gain a long-term partner in health. Find a Primary Care Provider  Learn more about [facility]'s in-office and virtual care options:  - Get Care Now

## 2023-04-14 NOTE — Progress Notes (Signed)
 Virtual Visit Consent   Micheal Mccoy, you are scheduled for a virtual visit with a [facility] provider today. Just as with appointments in the office, your consent must be obtained to participate. Your consent will be active for this visit and any virtual visit you may have with one of our providers in the next 365 days. If you have a MyChart account, a copy of this consent can be sent to you electronically.  As this is a virtual visit, video technology does not allow for your provider to perform a traditional examination. This may limit your provider's ability to fully assess your condition. If your provider identifies any concerns that need to be evaluated in person or the need to arrange testing (such as labs, EKG, etc.), we will make arrangements to do so. Although advances in technology are sophisticated, we cannot ensure that it will always work on either your end or our end. If the connection with a video visit is poor, the visit may have to be switched to a telephone visit. With either a video or telephone visit, we are not always able to ensure that we have a secure connection.  By engaging in this virtual visit, you consent to the provision of healthcare and authorize for your insurance to be billed (if applicable) for the services provided during this visit. Depending on your insurance coverage, you may receive a charge related to this service.  I need to obtain your verbal consent now. Are you willing to proceed with your visit today? Micheal Mccoy has provided verbal consent on 04/14/2023 for a virtual visit (video or telephone). Micheal Loveless, PA-C  Date: 04/14/2023 8:11 AM   Virtual Visit via Video Note   I, Micheal Mccoy, connected with  Micheal Mccoy  ([id number], [birth year]) on 04/14/23 at  8:15 AM EDT by a video-enabled telemedicine application and verified that I am speaking with the correct person using two identifiers.  Location: Patient: Virtual Visit Location  Patient: Home Provider: Virtual Visit Location Provider: Home Office   I discussed the limitations of evaluation and management by telemedicine and the availability of in person appointments. The patient expressed understanding and agreed to proceed.    History of Present Illness: Micheal Mccoy is a 68 y.o. who identifies as a male who was assigned male at birth, and is being seen today for bug bite with leg/ankle swelling.  HPI: Ankle Pain  The incident occurred 12 to 24 hours ago. The incident occurred at home (outside in the yard doing yard work, possible bug bite). Injury mechanism: possible bug bite. The pain is present in the left foot and left ankle. The quality of the pain is described as aching (itching at bug bite site). The pain has been Constant since onset. Pertinent negatives include no inability to bear weight, loss of motion or muscle weakness. He reports no foreign bodies present. The symptoms are aggravated by weight bearing. Treatments tried: cortisone. The treatment provided no relief.     Problems:  Patient Active Problem List   Diagnosis Date Noted   Wears hearing aid in both ears 11/06/2021   S/P cervical spinal fusion 01/25/2018   Prostate enlargement 02/23/2015   DDD (degenerative disc disease), lumbar 01/18/2015   Urinary frequency 01/17/2015   Neck pain, chronic 01/17/2015   History of adenomatous polyp of colon    Arthritis 08/24/2014   Benign prostatic hyperplasia 08/24/2014   Erectile dysfunction 08/24/2014   Peyronie's disease 06/28/2012  Cervicalgia 07/12/2007   Esophagitis, reflux 09/29/2005   Cervical spondylosis without myelopathy 01/06/1998    Allergies:  Allergies  Allergen Reactions   Aleve [Naproxen] Hives   Medications:  Current Outpatient Medications:    oxybutynin (DITROPAN-XL) 10 MG 24 hr tablet, Take 1 tablet (10 mg total) by mouth daily., Disp: 30 tablet, Rfl: 11   tadalafil (CIALIS) 20 MG tablet, Take 1 tablet (20 mg total) by mouth  daily. Or take every other day for ED, Disp: 90 tablet, Rfl: 3  Observations/Objective: Patient is well-developed, well-nourished in no acute distress.  Resting comfortably at home.  Head is normocephalic, atraumatic.  No labored breathing.  Speech is clear and coherent with logical content.  Patient is alert and oriented at baseline.  Bug bite noted on left medial calf near the calf muscle-achilles tendon juncture. Bite is swollen and erythematous with a central open area from the bite/sting. There is redness going distally from the bite down the leg to the medial malleolus with some mild swelling noted. No drainage was noted. Patient reports it is warm to touch over these areas. Exam limited due to being a video call  Assessment and Plan: There are no diagnoses linked to this encounter. - Suspect secondary infection/cellulitis from insect bite/sting (unknown insect) - Keflex added for infection - Prednisone for swelling and histamine reaction - Ice - Elevate - May apply topical cortisone and/or topical benadryl as needed for itching - Seek in person evaluation if symptoms worsen or fail to resolve  Follow Up Instructions: I discussed the assessment and treatment plan with the patient. The patient was provided an opportunity to ask questions and all were answered. The patient agreed with the plan and demonstrated an understanding of the instructions.  A copy of instructions were sent to the patient via MyChart unless otherwise noted below.    The patient was advised to call back or seek an in-person evaluation if the symptoms worsen or if the condition fails to improve as anticipated.    Micheal Loveless, PA-C

## 2023-04-21 ENCOUNTER — Other Ambulatory Visit: Admitting: Urology

## 2023-07-07 ENCOUNTER — Encounter: Payer: Self-pay | Admitting: Family Medicine

## 2023-07-07 ENCOUNTER — Ambulatory Visit (INDEPENDENT_AMBULATORY_CARE_PROVIDER_SITE_OTHER): Admitting: Family Medicine

## 2023-07-07 VITALS — BP 131/82 | HR 57 | Ht 74.0 in | Wt 191.0 lb

## 2023-07-07 DIAGNOSIS — R030 Elevated blood-pressure reading, without diagnosis of hypertension: Secondary | ICD-10-CM | POA: Diagnosis not present

## 2023-07-07 DIAGNOSIS — Z1159 Encounter for screening for other viral diseases: Secondary | ICD-10-CM

## 2023-07-07 DIAGNOSIS — Z0289 Encounter for other administrative examinations: Secondary | ICD-10-CM

## 2023-07-07 DIAGNOSIS — Z974 Presence of external hearing-aid: Secondary | ICD-10-CM | POA: Diagnosis not present

## 2023-07-07 DIAGNOSIS — Z111 Encounter for screening for respiratory tuberculosis: Secondary | ICD-10-CM | POA: Diagnosis not present

## 2023-07-07 NOTE — Patient Instructions (Signed)
  VISIT SUMMARY: Today, you came in for a [employer]  public [employer] health certificate form, which requires tuberculosis screening and updated vaccination records. We also discussed your recent outdoor activities, dietary habits, and blood pressure readings.  YOUR PLAN: -TUBERCULOSIS SCREENING: Tuberculosis is a bacterial infection that primarily affects the lungs. You need a Quantiferon Gold blood test for tuberculosis screening, which takes over a week for results. We have ordered this test for you.  -VACCINATION STATUS: Vaccinations help protect against various infectious diseases. You need to update your vaccination status for the health certificate. We have ordered blood tests to check your immunity to hepatitis B and MMR.   You should get the tetanus vaccine at a pharmacy due to insurance coverage.  high blood pressure, can lead to serious health issues if not managed. Your initial blood pressure reading was elevated, likely due to recent outdoor activities and dietary habits. We recommend you purchase a digital automated blood pressure cuff and monitor your blood pressure at home for a month. If it remains elevated, follow up with your primary care provider in 4-6 weeks.  -DIETARY HABITS: Your diet, particularly the intake of salty and processed foods, can affect your blood pressure. We discussed the importance of reducing these foods to help manage your blood pressure.  -FOLLOW-UP FOR HEALTH CERTIFICATE: You need to complete the health certificate form for the [employer]  [employer]. We will complete the form once your test results are available. You will be added to a waitlist for an earlier appointment if one becomes available.  INSTRUCTIONS: Please follow up with your primary care provider in 4-6 weeks if your blood pressure remains elevated after a month of home monitoring. Additionally, ensure you get the tetanus vaccine at a pharmacy and complete the required blood tests for  hepatitis B and MMR immunity.                      Contains text generated by Abridge.                                 Contains text generated by Abridge.

## 2023-07-07 NOTE — Progress Notes (Signed)
 Established patient visit   Patient: Micheal Mccoy   DOB: 1955/12/15   68 y.o. Male  MRN: [id number] Visit Date: 07/07/2023  Today's healthcare provider: Rockie Agent, MD   Chief Complaint  Patient presents with   Forms    Needs work form completed    Subjective     HPI     Forms    Additional comments: Needs work form completed       Last edited by Thelbert Eulalio HERO, CMA on 07/07/2023  1:50 PM.       Discussed the use of AI scribe software for clinical note transcription with the patient, who gave verbal consent to proceed.  History of Present Illness Micheal Mccoy is a 68 year old male who presents for a [employer]  public school health certificate form.  He requires a health certificate form for the [employer]  [employer] system, which includes tuberculosis screening and vaccination records. No symptoms such as coughing, shortness of breath, night sweats, or hemoptysis.  He has been working outside in the yard in the hot sun over the last several days, leading to cramping and increased water  intake. No headaches or vision changes. He does not have a device to measure blood pressure at home and has never taken medication for blood pressure.  He mentions a past leg issue where he was bitten and required antibiotics. He experiences ankle swelling when sitting for extended periods while working on his laptop.  He maintains a routine of waking up early, engaging in outdoor activities like cutting grass, and working out three to four times a week. His diet includes processed foods and salty snacks, which may contribute to elevated blood pressure.     Past Medical History:  Diagnosis Date   Arthritis    Benign neoplasm of ascending colon    COVID-19 02/14/2021   Degenerative disc disease, cervical    Esophagitis, reflux 09/29/2005   Frequency of urination    GERD (gastroesophageal reflux disease)    Peyronie's disease    W/ PENILE CURVATURE     Medications: Outpatient Medications Prior to Visit  Medication Sig   cephALEXin  (KEFLEX ) 500 MG capsule Take 1 capsule (500 mg total) by mouth 4 (four) times daily. (Patient not taking: Reported on 07/07/2023)   oxybutynin  (DITROPAN -XL) 10 MG 24 hr tablet Take 1 tablet (10 mg total) by mouth daily. (Patient not taking: Reported on 07/07/2023)   predniSONE  (STERAPRED UNI-PAK 21 TAB) 10 MG (21) TBPK tablet 6 day taper; take as directed on package instructions (Patient not taking: Reported on 07/07/2023)   tadalafil  (CIALIS ) 20 MG tablet Take 1 tablet (20 mg total) by mouth daily. Or take every other day for ED   No facility-administered medications prior to visit.    Review of Systems  Last metabolic panel Lab Results  Component Value Date   GLUCOSE 93 11/06/2021   NA 140 11/06/2021   K 4.7 11/06/2021   CL 103 11/06/2021   CO2 26 11/06/2021   BUN 8 11/06/2021   CREATININE 0.94 11/06/2021   EGFR 88 11/06/2021   CALCIUM 9.7 11/06/2021   PROT 7.2 11/06/2021   ALBUMIN 4.5 11/06/2021   LABGLOB 2.7 11/06/2021   AGRATIO 1.7 11/06/2021   BILITOT 0.6 11/06/2021   ALKPHOS 63 11/06/2021   AST 17 11/06/2021   ALT 11 11/06/2021   ANIONGAP 3 (L) 03/07/2019   Last lipids Lab Results  Component Value Date   CHOL 176 02/24/2020  HDL 59 02/24/2020   LDLCALC 107 (H) 02/24/2020   TRIG 50 02/24/2020   CHOLHDL 3.0 02/24/2020   Last hemoglobin A1c No results found for: HGBA1C Last thyroid functions Lab Results  Component Value Date   TSH 1.470 11/06/2021        Objective    BP 131/82   Pulse (!) 57   Ht 6' 2 (1.88 m)   Wt 191 lb (86.6 kg)   SpO2 100%   BMI 24.52 kg/m  BP Readings from Last 3 Encounters:  07/07/23 131/82  03/11/23 (!) 150/93  02/03/23 138/82   Wt Readings from Last 3 Encounters:  07/07/23 191 lb (86.6 kg)  03/11/23 190 lb (86.2 kg)  02/03/23 195 lb 9.6 oz (88.7 kg)        Physical Exam  Physical Exam VITALS: BP- 151/84 GENERAL: No acute  distress. CHEST: Lungs clear to auscultation bilaterally. CARDIOVASCULAR: Regular rate and rhythm, no murmurs. ABDOMEN: Normal bowel sounds.    No results found for any visits on 07/07/23.  Assessment & Plan     Problem List Items Addressed This Visit       Other   Wears hearing aid in both ears   Other Visit Diagnoses       Elevated blood pressure reading    -  Primary     Encounter for completion of form with patient         Screening-pulmonary TB       Relevant Orders   QuantiFERON-TB Gold Plus     Need for hepatitis B screening test       Relevant Orders   Hepatitis B surface antibody,qualitative        Assessment & Plan Tuberculosis Screening He requires tuberculosis screening for a [employer]  [employer] health certificate. The Quantiferon Gold blood test, which takes over a week for results, was discussed and agreed upon. - Order Maya Cera test for tuberculosis screening  Vaccination Status He needs to update his vaccination status for the health certificate. Hepatitis B vaccine and tetanus vaccine are required. Due to insurance coverage, he is advised to obtain the tetanus vaccine at a pharmacy. Immunity to hepatitis B and MMR will be checked through blood tests. He may likely have natural immunity to MMR due to his birth year. - Order hepatitis B surface antibody test - Order MMR titers for immunity - Advise getting tetanus vaccine at a pharmacy  Elevated Blood pressure  Blood pressure was initially 151/84 mmHg, elevated without a history of hypertension or antihypertensive medication use. Recent outdoor activities and dietary habits may have contributed. Rechecked at 131/82 mmHg. Home monitoring is recommended to ensure it does not remain elevated. He was advised to purchase a digital automated blood pressure cuff and monitor for a month before considering medication. - Recommend purchasing a digital automated blood pressure cuff for home  monitoring - Advise monitoring blood pressure at home for a month - Suggest follow-up with PCP in 4-6 weeks if blood pressure remains elevated  Dietary Habits He maintains an active lifestyle, working out three to four times a week and engaging in outdoor activities. The impact of dietary habits, particularly salty and processed foods, on blood pressure was discussed. He was advised to reduce intake of these foods to help manage blood pressure. - Advise reducing intake of salty and processed foods to help manage blood pressure  Follow-up for Health Certificate He needs to complete the health certificate form for the [employer]  [employer].  The form will be completed once test results are available. He will be added to a waitlist for an earlier appointment if available. - Complete health certificate form once test results are available, will place in my working document tray in front office of [address] until results are available to complete       Return in about 3 months (around 10/07/2023) for BP .         Rockie Agent, MD  [facility] [phone] (phone) [phone] (fax)  [facility]

## 2023-07-08 ENCOUNTER — Telehealth: Payer: Self-pay

## 2023-07-08 ENCOUNTER — Ambulatory Visit: Payer: Self-pay | Admitting: Family Medicine

## 2023-07-08 ENCOUNTER — Other Ambulatory Visit: Payer: Self-pay

## 2023-07-08 DIAGNOSIS — N5239 Other post-surgical erectile dysfunction: Secondary | ICD-10-CM

## 2023-07-08 MED ORDER — TADALAFIL 20 MG PO TABS: 20.0000 mg | ORAL_TABLET | Freq: Every day | ORAL | 3 refills | Status: DC

## 2023-07-08 NOTE — Telephone Encounter (Signed)
 Patient requesting order for Hep B vaccine you recommended be sent to pharmacy   Copied from CRM [id number]. Topic: Clinical - Request for Lab/Test Order >> Jul 08, 2023 11:55 AM Micheal Mccoy wrote: Reason for CRM:  Please Aseem about getting the order for the hepatitis B vaccine that Dr. Lang has recommended. I do not see an order for vaccine. His number is [phone]. He is at [facility] pharmacy now. Thanks >> Jul 08, 2023 12:08 PM Micheal Mccoy wrote: I tried to transfer him to clinic and there was no answer

## 2023-07-08 NOTE — Telephone Encounter (Signed)
 Disregard, patient already got it

## 2023-07-08 NOTE — Telephone Encounter (Signed)
 Received fax via onbase stating that Heplisav-B 58mcg/0.5ml syringe has been approved until 01/06/2024

## 2023-07-08 NOTE — Telephone Encounter (Signed)
 Reviewed waiting on quant gold results.   Will abstract hep b and updated tetanus vaccines

## 2023-07-09 NOTE — Telephone Encounter (Signed)
 Approved hep b vax until 12/2023

## 2023-07-11 LAB — QUANTIFERON-TB GOLD PLUS
QuantiFERON Mitogen Value: >10 [IU]/mL
QuantiFERON Nil Value: 0.09 [IU]/mL
QuantiFERON TB1 Ag Value: 0.06 [IU]/mL
QuantiFERON TB2 Ag Value: 0.06 [IU]/mL
QuantiFERON-TB Gold Plus: NEGATIVE

## 2023-07-11 LAB — HEPATITIS B SURFACE ANTIBODY,QUALITATIVE: Hep B Surface Ab, Qual: NONREACTIVE

## 2023-07-13 NOTE — Telephone Encounter (Signed)
 Please see the pt message below, Dr R is out of the clinic today, forms has been placed on the providers box to be completed

## 2023-07-14 NOTE — Telephone Encounter (Signed)
 Copied from CRM [id number]. Topic: Clinical - Request for Lab/Test Order >> Jul 14, 2023 10:23 AM Zebedee SAUNDERS wrote: Pt called again stated Dr. 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 Pt states he handed paper work to Dr. Lang last week. He ask for a call back at [phone] to confirm.

## 2023-07-14 NOTE — Telephone Encounter (Signed)
 I am not in the office to sign this form. Looks like he was seen and had labs ordered by Dr. Lang last week.

## 2023-07-15 NOTE — Telephone Encounter (Signed)
 Forms completed, results of negative quant gold attached and will be faxed to number on forms today 07/15/23  Placed in to be faxed file in [address]

## 2023-10-07 ENCOUNTER — Ambulatory Visit (INDEPENDENT_AMBULATORY_CARE_PROVIDER_SITE_OTHER): Admitting: Family Medicine

## 2023-10-07 ENCOUNTER — Telehealth: Payer: Self-pay

## 2023-10-07 ENCOUNTER — Encounter: Payer: Self-pay | Admitting: Family Medicine

## 2023-10-07 VITALS — BP 133/97 | HR 63 | Resp 14 | Ht 74.0 in | Wt 192.5 lb

## 2023-10-07 DIAGNOSIS — N5239 Other post-surgical erectile dysfunction: Secondary | ICD-10-CM

## 2023-10-07 DIAGNOSIS — F439 Reaction to severe stress, unspecified: Secondary | ICD-10-CM | POA: Diagnosis not present

## 2023-10-07 DIAGNOSIS — R030 Elevated blood-pressure reading, without diagnosis of hypertension: Secondary | ICD-10-CM | POA: Diagnosis not present

## 2023-10-07 DIAGNOSIS — Z23 Encounter for immunization: Secondary | ICD-10-CM

## 2023-10-07 DIAGNOSIS — Z136 Encounter for screening for cardiovascular disorders: Secondary | ICD-10-CM

## 2023-10-07 DIAGNOSIS — Z125 Encounter for screening for malignant neoplasm of prostate: Secondary | ICD-10-CM | POA: Diagnosis not present

## 2023-10-07 MED ORDER — TADALAFIL 20 MG PO TABS: 20.0000 mg | ORAL_TABLET | Freq: Every day | ORAL | 3 refills | Status: AC

## 2023-10-07 NOTE — Telephone Encounter (Unsigned)
 Copied from CRM [id number]. Topic: Clinical - Lab/Test Results >> Oct 07, 2023  8:55 AM Willma SAUNDERS wrote: Reason for CRM: Alan from [employer] stating additional diagnosis code for labs done on 07/07/2023.  Alan can be reached at [phone]

## 2023-10-07 NOTE — Progress Notes (Signed)
 Patient is in office today for a nurse visit for Immunization. Patient Injection was given in the  Left deltoid. Patient tolerated injection well.

## 2023-10-07 NOTE — Progress Notes (Signed)
 Established patient visit   Patient: Micheal Mccoy   DOB: 1955/09/30   68 y.o. Male  MRN: [id number] Visit Date: 10/07/2023  Today's healthcare provider: Nancyann Perry, MD   Chief Complaint  Patient presents with   Medical Management of Chronic Issues    No changes   Subjective    Discussed the use of AI scribe software for clinical note transcription with the patient, who gave verbal consent to proceed.  History of Present Illness   Micheal Mccoy is a 68 year old male who presents for a blood pressure check-up and follow up E.D. .  He is not currently taking any blood pressure medications and feels well overall. 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 Despite this, he manages stress through exercise and dietary changes. No chest pain, heart flutters, or shortness of breath.  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 This shift has helped him stay active and manage stress.  He has made significant dietary changes, avoiding processed foods and caffeine, and incorporating more nuts, grains, fruits, and juices into his diet. He keeps a mixture of nuts, including cashews and walnuts, with cranberries as a snack for energy throughout the day.  He is currently taking Cialis  and has not experienced any side effects. He typically receives a 90-day supply from [employer].       Medications: Outpatient Medications Prior to Visit  Medication Sig   tadalafil  (CIALIS ) 20 MG tablet Take 1 tablet (20 mg total) by mouth daily. Or take every other day for ED   No facility-administered medications prior to visit.   Review of Systems  Constitutional:  Negative for appetite change, chills and fever.  Respiratory:  Negative for chest tightness, shortness of breath and wheezing.    Cardiovascular:  Negative for chest pain and palpitations.  Gastrointestinal:  Negative for abdominal pain, nausea and vomiting.       Objective    BP (!) 133/97   Pulse 63   Resp 14   Ht 6' 2 (1.88 m)   Wt 192 lb 8 oz (87.3 kg)   SpO2 100%   BMI 24.72 kg/m   Physical Exam   General appearance: Well developed, well nourished male, cooperative and in no acute distress Head: Normocephalic, without obvious abnormality, atraumatic Respiratory: Respirations even and unlabored, normal respiratory rate Extremities: All extremities are intact.  Skin: Skin color, texture, turgor normal. No rashes seen  Psych: Appropriate mood and affect. Neurologic: Mental status: Alert, oriented to person, place, and time, thought content appropriate.     Assessment & Plan    1. Post-procedural erectile dysfunction, unspecified type (Primary) refil- tadalafil  (CIALIS ) 20 MG tablet; Take 1 tablet (20 mg total) by mouth daily. Or take every other day for ED  Dispense: 90 tablet; Refill: 3  2. Elevated blood pressure reading Aggravated by work stress. Is following healthy diet and exercise lifestyle. Continue to monitor.  - Renal function panel  3. Situational stress   4. Prostate cancer screening  - PSA Total (Reflex To Free)  5. Encounter for special screening examination for cardiovascular disorder  - Renal function panel - Lipid panel  Other orders - Flu vaccine HIGH DOSE PF(Fluzone Trivalent)      Nancyann Perry, MD  [facility] [phone] (phone) [phone] (  fax)  [facility]

## 2023-10-08 ENCOUNTER — Ambulatory Visit: Payer: Self-pay | Admitting: Family Medicine

## 2023-10-08 LAB — RENAL FUNCTION PANEL
Albumin: 4.3 g/dL (ref 3.9–4.9)
BUN/Creatinine Ratio: 10 (ref 10–24)
BUN: 9 mg/dL (ref 8–27)
CO2: 24 mmol/L (ref 20–29)
Calcium: 9.6 mg/dL (ref 8.6–10.2)
Chloride: 101 mmol/L (ref 96–106)
Creatinine, Ser: 0.9 mg/dL (ref 0.76–1.27)
Glucose: 77 mg/dL (ref 70–99)
Phosphorus: 2.8 mg/dL (ref 2.8–4.1)
Potassium: 4.2 mmol/L (ref 3.5–5.2)
Sodium: 140 mmol/L (ref 134–144)
eGFR: 92 mL/min/1.73 (ref >59)

## 2023-10-08 LAB — PSA TOTAL (REFLEX TO FREE): Prostate Specific Ag, Serum: 1.4 ng/mL (ref 0.0–4.0)

## 2023-10-08 LAB — LIPID PANEL
Chol/HDL Ratio: 2.9 ratio (ref 0.0–5.0)
Cholesterol, Total: 160 mg/dL (ref 100–199)
HDL: 55 mg/dL (ref >39)
LDL Chol Calc (NIH): 96 mg/dL (ref 0–99)
Triglycerides: 42 mg/dL (ref 0–149)
VLDL Cholesterol Cal: 9 mg/dL (ref 5–40)

## 2023-10-09 NOTE — Telephone Encounter (Signed)
 I filled out and signed a form regarding additional labs on Wednesday.

## 2023-10-27 DIAGNOSIS — N393 Stress incontinence (female) (male): Secondary | ICD-10-CM | POA: Diagnosis not present

## 2023-10-27 DIAGNOSIS — R030 Elevated blood-pressure reading, without diagnosis of hypertension: Secondary | ICD-10-CM | POA: Diagnosis not present

## 2023-10-27 DIAGNOSIS — N400 Enlarged prostate without lower urinary tract symptoms: Secondary | ICD-10-CM | POA: Diagnosis not present

## 2023-10-27 DIAGNOSIS — N529 Male erectile dysfunction, unspecified: Secondary | ICD-10-CM | POA: Diagnosis not present

## 2024-02-09 ENCOUNTER — Ambulatory Visit: Payer: Self-pay
